# Patient Record
Sex: Female | Born: 1960
Health system: Southern US, Community
[De-identification: ages and names within clinical notes are randomized; demographics above are authoritative.]

## PROBLEM LIST (undated history)

## (undated) DIAGNOSIS — J40 Bronchitis, not specified as acute or chronic: Secondary | ICD-10-CM

## (undated) DIAGNOSIS — K649 Unspecified hemorrhoids: Secondary | ICD-10-CM

## (undated) DIAGNOSIS — R519 Headache, unspecified: Secondary | ICD-10-CM

## (undated) DIAGNOSIS — I10 Essential (primary) hypertension: Secondary | ICD-10-CM

## (undated) DIAGNOSIS — E785 Hyperlipidemia, unspecified: Secondary | ICD-10-CM

## (undated) DIAGNOSIS — D649 Anemia, unspecified: Secondary | ICD-10-CM

## (undated) DIAGNOSIS — N920 Excessive and frequent menstruation with regular cycle: Secondary | ICD-10-CM

## (undated) HISTORY — DX: Excessive and frequent menstruation with regular cycle: N92.0

## (undated) HISTORY — PX: COLONOSCOPY: SHX174

## (undated) HISTORY — DX: Anemia, unspecified: D64.9

## (undated) HISTORY — DX: Hyperlipidemia, unspecified: E78.5

## (undated) HISTORY — DX: Unspecified hemorrhoids: K64.9

## (undated) HISTORY — PX: POLYPECTOMY: SHX149

---

## 1999-12-18 ENCOUNTER — Emergency Department (HOSPITAL_COMMUNITY): Admission: EM | Admit: 1999-12-18 | Discharge: 1999-12-18 | Payer: Self-pay | Admitting: *Deleted

## 2001-01-11 ENCOUNTER — Emergency Department (HOSPITAL_COMMUNITY): Admission: EM | Admit: 2001-01-11 | Discharge: 2001-01-12 | Payer: Self-pay | Admitting: *Deleted

## 2001-06-15 HISTORY — PX: ABDOMINAL HYSTERECTOMY: SHX81

## 2002-03-27 ENCOUNTER — Encounter: Payer: Self-pay | Admitting: Emergency Medicine

## 2002-03-28 ENCOUNTER — Inpatient Hospital Stay (HOSPITAL_COMMUNITY): Admission: EM | Admit: 2002-03-28 | Discharge: 2002-03-30 | Payer: Self-pay | Admitting: Emergency Medicine

## 2002-03-28 ENCOUNTER — Encounter: Payer: Self-pay | Admitting: Emergency Medicine

## 2002-04-13 ENCOUNTER — Other Ambulatory Visit: Admission: RE | Admit: 2002-04-13 | Discharge: 2002-04-13 | Payer: Self-pay | Admitting: Family Medicine

## 2002-04-13 ENCOUNTER — Encounter: Admission: RE | Admit: 2002-04-13 | Discharge: 2002-04-13 | Payer: Self-pay | Admitting: Obstetrics and Gynecology

## 2002-04-14 ENCOUNTER — Encounter (INDEPENDENT_AMBULATORY_CARE_PROVIDER_SITE_OTHER): Payer: Self-pay | Admitting: *Deleted

## 2002-04-21 ENCOUNTER — Ambulatory Visit (HOSPITAL_COMMUNITY): Admission: RE | Admit: 2002-04-21 | Discharge: 2002-04-21 | Payer: Self-pay | Admitting: Obstetrics and Gynecology

## 2002-04-28 ENCOUNTER — Encounter: Admission: RE | Admit: 2002-04-28 | Discharge: 2002-04-28 | Payer: Self-pay | Admitting: *Deleted

## 2002-05-15 ENCOUNTER — Inpatient Hospital Stay (HOSPITAL_COMMUNITY): Admission: RE | Admit: 2002-05-15 | Discharge: 2002-05-19 | Payer: Self-pay | Admitting: Family Medicine

## 2002-05-15 ENCOUNTER — Encounter (INDEPENDENT_AMBULATORY_CARE_PROVIDER_SITE_OTHER): Payer: Self-pay | Admitting: Specialist

## 2002-05-22 ENCOUNTER — Inpatient Hospital Stay (HOSPITAL_COMMUNITY): Admission: AD | Admit: 2002-05-22 | Discharge: 2002-05-22 | Payer: Self-pay | Admitting: *Deleted

## 2002-05-26 ENCOUNTER — Inpatient Hospital Stay (HOSPITAL_COMMUNITY): Admission: AD | Admit: 2002-05-26 | Discharge: 2002-05-26 | Payer: Self-pay | Admitting: *Deleted

## 2002-06-01 ENCOUNTER — Encounter: Admission: RE | Admit: 2002-06-01 | Discharge: 2002-06-01 | Payer: Self-pay | Admitting: Obstetrics and Gynecology

## 2002-11-21 ENCOUNTER — Encounter: Admission: RE | Admit: 2002-11-21 | Discharge: 2002-11-21 | Payer: Self-pay | Admitting: Internal Medicine

## 2002-11-21 ENCOUNTER — Encounter: Payer: Self-pay | Admitting: Internal Medicine

## 2002-11-21 ENCOUNTER — Ambulatory Visit (HOSPITAL_COMMUNITY): Admission: RE | Admit: 2002-11-21 | Discharge: 2002-11-21 | Payer: Self-pay | Admitting: Internal Medicine

## 2003-01-31 ENCOUNTER — Encounter: Admission: RE | Admit: 2003-01-31 | Discharge: 2003-01-31 | Payer: Self-pay | Admitting: Internal Medicine

## 2004-12-12 ENCOUNTER — Emergency Department (HOSPITAL_COMMUNITY): Admission: EM | Admit: 2004-12-12 | Discharge: 2004-12-12 | Payer: Self-pay | Admitting: Emergency Medicine

## 2007-07-10 ENCOUNTER — Emergency Department (HOSPITAL_COMMUNITY): Admission: EM | Admit: 2007-07-10 | Discharge: 2007-07-10 | Payer: Self-pay | Admitting: Emergency Medicine

## 2007-09-23 ENCOUNTER — Emergency Department (HOSPITAL_COMMUNITY): Admission: EM | Admit: 2007-09-23 | Discharge: 2007-09-23 | Payer: Self-pay | Admitting: Emergency Medicine

## 2007-09-25 ENCOUNTER — Emergency Department (HOSPITAL_COMMUNITY): Admission: EM | Admit: 2007-09-25 | Discharge: 2007-09-25 | Payer: Self-pay | Admitting: Emergency Medicine

## 2008-07-26 ENCOUNTER — Emergency Department (HOSPITAL_COMMUNITY): Admission: EM | Admit: 2008-07-26 | Discharge: 2008-07-26 | Payer: Self-pay | Admitting: Emergency Medicine

## 2010-09-30 LAB — POCT I-STAT, CHEM 8
Chloride: 108 mEq/L (ref 96–112)
Glucose, Bld: 113 mg/dL — ABNORMAL HIGH (ref 70–99)
HCT: 39 % (ref 36.0–46.0)
Hemoglobin: 13.3 g/dL (ref 12.0–15.0)
Potassium: 3.8 mEq/L (ref 3.5–5.1)

## 2010-09-30 LAB — SAMPLE TO BLOOD BANK

## 2010-09-30 LAB — CBC
Platelets: 356 10*3/uL (ref 150–400)
RDW: 13.5 % (ref 11.5–15.5)
WBC: 8.5 10*3/uL (ref 4.0–10.5)

## 2010-09-30 LAB — DIFFERENTIAL
Basophils Absolute: 0 10*3/uL (ref 0.0–0.1)
Lymphocytes Relative: 27 % (ref 12–46)
Lymphs Abs: 2.3 10*3/uL (ref 0.7–4.0)
Neutro Abs: 5.4 10*3/uL (ref 1.7–7.7)
Neutrophils Relative %: 64 % (ref 43–77)
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
## 2010-10-31 NOTE — H&P (Signed)
   NAME:  Allison Mcintyre, Allison Mcintyre                     ACCOUNT NO.:  [id number]   MEDICAL RECORD NO.:  [id number]                   PATIENT TYPE:  INP   LOCATION:  NA                                   FACILITY:  [facility]   PHYSICIAN:  Tanya S. Shawnie Pons, M.D.                DATE OF BIRTH:  January 26, 1961   DATE OF ADMISSION:  05/15/2002  DATE OF DISCHARGE:                                HISTORY & PHYSICAL   CHIEF COMPLAINT:  Heavy menses.   HISTORY OF PRESENT ILLNESS:  The patient is a 50 year old, G4, P1 referred  for large fibroid uterus who was previously admitted to [facility]  with hemoglobin 5.5.  She is status post transfusion of five units of packed  red blood cells.  She does report heavy menses for the past seven to eight  months and increasing abdominal girth.   PAST MEDICAL HISTORY:  1. Anemia.  2. One prior C-section.   MEDICATIONS:  None.   ALLERGIES:  No known drug allergies.   SOCIAL HISTORY:  She is a smoker of a 1/2 pack per day.  She drinks alcohol  three times per week.   FAMILY HISTORY:  Significant for breast cancer, hypertension and diabetes  mellitus.   PHYSICAL EXAMINATION:  VITAL SIGNS:  Weight 209.8, blood pressure 135/71,  pulse 74.  GENERAL:  She is a well-developed, well-nourished, African-American female  in no acute distress.  HEENT:  Pupils equal round and reactive to light and accommodation.  Extraocular movements intact.  Sclerae anicteric  NECK:  Supple without lymphadenopathy or thyromegaly.  LUNGS:  Clear bilaterally.  HEART:  Regular S1, S2 with no murmurs.  BREASTS:  Benign and symmetric with no masses with everted nipples.  ABDOMEN:  Large mass consistent with fibroids.  GENITALIA:  Normal external female genitalia.  The vagina is rugae with  thin, watery discharge.  Her cervix is small without lesion.  Her uterus is  24-28 weeks size with multiple fibroids.  Her adnexa are unable to be  assessed at this time.   LABORATORY DATA AND  X-RAY FINDINGS:  The patient has a normal Pap smear and  a normal endometrial biopsy.   IMPRESSION:  1. Fibroid uterus.  2.     Anemia.  3. Menometrorrhagia.   PLAN:  The plan is for a TAH.                                               Shelbie Proctor. Shawnie Pons, M.D.    TSP/MEDQ  D:  05/11/2002  T:  05/12/2002  Job:  [id number]

## 2010-10-31 NOTE — Discharge Summary (Signed)
   NAME:  Allison Mcintyre, Allison Mcintyre                     ACCOUNT NO.:  [id number]   MEDICAL RECORD NO.:  [id number]                   PATIENT TYPE:  INP   LOCATION:  9324                                 FACILITY:  [facility]   PHYSICIAN:  Tanya S. Shawnie Pons, M.D.                DATE OF BIRTH:  Sep 25, 1960   DATE OF ADMISSION:  05/15/2002  DATE OF DISCHARGE:  05/19/2002                                 DISCHARGE SUMMARY   FINAL DIAGNOSES:  1. Fibroid uterus.  2. Menometrorrhagia.  3. Significant anemia.   PROCEDURE:  1. Total abdominal hysterectomy.  2. Right salpingo-oophorectomy.   PERTINENT LABORATORY VALUES:  A preoperative hemoglobin 11.8, postoperative  hemoglobin 9.8.  Urine pregnancy test that was negative.   REASON FOR ADMISSION:  Briefly, patient is a 50 year old gravida 4, para 1  who was admitted after being found to have a large fibroid uterus who  desired definitive treatment.   HOSPITAL COURSE:  The patient was admitted on the day of surgery and  underwent a TAH/RSO without complication.  Postoperatively she was  transferred to the floor where she had routine postoperative care.  She did  develop a mild ileus on postoperative day number two which was resolved with  a Dulcolax suppository.  Her pain control was slightly limited after her PCA  was discontinued so she was kept an additional day and discharged home on  postoperative day number four.  At that time she had been afebrile since her  surgery and she had good urine output and stable vital signs.  She was  tolerating a regular diet without nausea and vomiting.   DISCHARGE DISPOSITION AND CONDITION:  The patient is discharged home in good  condition.   DISCHARGE INSTRUCTIONS:  No heavy lifting for the next six weeks.  No  driving for the next two weeks.   FOLLOW UP:  GYN clinic in two weeks.  She will return to [facility] at [facility] for staple removal on Monday, December 8.   DISCHARGE MEDICATIONS:  1. Percocet 5/325  one to two p.o. q.4-6h. p.r.n. pain number 48.  2. Ibuprofen 600 mg one p.o. q.6h. p.r.n. with food number 45.                                               Shelbie Proctor. Shawnie Pons, M.D.    TSP/MEDQ  D:  05/19/2002  T:  05/20/2002  Job:  [id number]

## 2010-10-31 NOTE — Op Note (Signed)
NAME:  Allison Mcintyre, Allison Mcintyre                     ACCOUNT NO.:  [id number]   MEDICAL RECORD NO.:  [id number]                   PATIENT TYPE:  INP   LOCATION:  9324                                 FACILITY:  [facility]   PHYSICIAN:  Tanya S. Shawnie Pons, M.D.                DATE OF BIRTH:  1960/11/15   DATE OF PROCEDURE:  05/15/2002  DATE OF DISCHARGE:                                 OPERATIVE REPORT   PREOPERATIVE DIAGNOSES:  1. Fibroid uterus.  2. Anemia.   POSTOPERATIVE DIAGNOSES:  1. Fibroid uterus.  2. Anemia.   PROCEDURE:  Total abdominal hysterectomy and right salpingo-oophorectomy.   SURGEON:  Shelbie Proctor. Shawnie Pons, M.D.   ASSISTANT:  Mary Sella. Orlene Erm, M.D.   ANESTHESIA:  General.   ESTIMATED BLOOD LOSS:  400 cc.   COMPLICATIONS:  None.   SPECIMENS:  Uterus, cervix, right tube and ovary to pathology.   INDICATIONS:  The patient is a 50 year old G4, P1, with a history of  menometrorrhagia with a hemoglobin of 5.5.  She is status post transfusion  of 5 units of packed cells.  She was found to have a large fibroid uterus.  She was refractory to therapy.  The patient did have a normal endometrial  biopsy and Pap smear prior to this procedure.  The patient does desire  definitive treatment and given the size of her uterus it was felt that  surgery would be her only option.  She had no future fertility needs.   DESCRIPTION OF PROCEDURE:  The patient was taken to the operating room where  she was placed in the supine position with TED hose and sequential  compression device was placed.  She was given 2 g of Ancef prior to  incision.  She was prepped and draped in the usual sterile fashion.  Anesthesia was administered.  When she was completely anesthetized a  vertical skin incision was made through previous C-section scar from the  pubic symphysis to the umbilicus.  This was carried down to the underlying  fascia which was then sharply divided.  The fascia edge was then dissected  off the  rectus muscles and the peritoneal cavity entered.  The cautery was  used to dissect superiorly and inferiorly to open the incision.  Upon entry  into the abdomen, the cavity was explored.  The liver edge felt normal as  did the omentum and the bowels.  The kidneys also felt normal.  Her uterus  was very large but was able to be delivered through the incision.  However,  there was no room to place the retractor blades.  After an attempt was made  to place retraction blades with the Bookwalter the incision was then  extended superiorly around the umbilicus at which time the Bookwalter was  then used for retraction and wet laps were used to pack the bowel away.  We  tried delivering the uterus through the incision.  A  rent was made in the  right IP which had to be clamped and the right tube and ovary were  subsequently taken with the specimen.  The right round was then suture  ligated and divided and the anterior ligament of the broad dissected,  particularly the bladder flap.  The posterior leaf of the broad was then  dissected down in attempt to find the ureter but this could not be located  at this time.  The IP was then doubly clamped and ligated with a free tie  followed by a suture.  Subsequently, the left round ligament was grasped and  ligated and the anterior leaf of the broad was then taken to create a  bladder flap which was pushed down fairly evenly.  The posterior leaf was  then divided down, and the utero-ovarian pedicle was doubly clamped and  ligated with a free tie followed by a suture.  The uterine arteries were  then clamped on each side and suture ligated with #1 Vicryl suture.  Straight clamps were then used to advance along the remaining uterus down to  the cervix taking bites which were sequentially suture ligated.  The uterus  was then transected at the level of the cervix just for better visualization  of the surgical field.  The cervix was then grasped with a Kocher  clamp and  straight clamps were then sequentially used to suture ligate the pedicles  down to the uterosacral ligaments.  Then two clamps were clamped across the  vagina and the cervix removed intact.  A single running suture of #1 Vicryl  was then placed from each clamp from the midline to the uterosacral  ligament.  The clamp was then removed, and the suture was run back to the  midline and tied down so that the vagina was never entered.  At the end of  this, all pedicles appeared to be dry.  Attempt was made to go back to the  right posterior leaf of the broad ligament to find the ureter.  This was  found and had a good peristalsis.  It was followed along for some distance  and thought to be intact.  The incision in the abdomen was then irrigated  with warm water.  The pedicles were all checked and found to be hemostatic.  All instruments and sponges were removed from the abdominal cavity including  the retractor blades and the fascia was closed with 0 PDS looped in a  modified running Smead-Jones fashion superiorly and then inferior edge of  the fascia was closed with 0 Vicryl looped PDS in a running fashion and  these two sutures were tied in the midline.  The subcu tissue was then  copiously irrigated and found to be hemostatic.  The skin was closed with  clips.  All instrument, needle and lap counts were correct x2.  The patient  was awakened, extubated and taken to the recovery room in stable condition.                                               Shelbie Proctor. Shawnie Pons, M.D.    TSP/MEDQ  D:  05/15/2002  T:  05/16/2002  Job:  [id number]

## 2010-10-31 NOTE — Discharge Summary (Signed)
NAME:  Allison Mcintyre, Allison Mcintyre                     ACCOUNT NO.:  [id number]   MEDICAL RECORD NO.:  [id number]                   PATIENT TYPE:  INP   LOCATION:  0365                                 [facility]ACILITY:  [facility]   PHYSICIAN:  Armstead Peaks, M.D.                   DATE OF BIRTH:  1960/12/12   DATE OF ADMISSION:  03/28/2002  DATE OF DISCHARGE:  03/30/2002                                 DISCHARGE SUMMARY   DISCHARGE DIAGNOSES:  1. Severe iron-deficiency anemia secondary to heavy menstrual periods.  2. Uterine fibroids.   PROCEDURE:  1. Packed red blood cells transfusion.  2. Computed tomography of the chest, abdomen, and pelvis.   DISCHARGE MEDICATIONS:  Nu Iron 150 mg one pill b.i.d.   HISTORY OF PRESENT ILLNESS:  The patient is a 50 year old female who  presented to the [facility] Emergency room with complaints of  several month history of shortness of breath.  In the emergency department,  the patient was sent for a CT scan of the chest, abdomen, and pelvis to rule  out pulmonary embolism which was negative for pulmonary embolism or deep  vein thrombosis.  She was noted, however, to have a very large uterus with  fibroids.  She was found to have a severely low hemoglobin at 5.5 with a low  MCV.  She was admitted for further evaluation and care, and the following  developments took place:   HOSPITAL COURSE:  #1 -  ANEMIA:  The patient received 5 units of packed red  blood cells transfusions which raised her hemoglobin from 5.5 to  approximately 9.5 to 10.0.  This remained stable for 24 to 36 hours prior to  discharge.  She was started on iron supplementation.  This was thought to be  secondary to heavy menstrual periods, and it is likely that the patient will  need a hysterectomy in the future to help prevent recurrent symptoms with  anemia.  She is agreeable to this, and will see the outpatient [facility] gynecology  clinic in the next one to two weeks for further  consideration.  #2 -  INFECTIOUS DISEASE:  The patient did have a negative gonorrhea,  chlamydia, and RPR during her hospital stay.   DISPOSITION:  The patient is discharged in stable condition.  She will call  [facility] and establish appointment for regular medical care.  She will  also call the outpatient OB gynecology clinic to establish an appointment in  the next one to two weeks for consideration of possible hysterectomy.  She  will need a followup CBC in approximately two weeks for re-evaluation.   CONDITION ON DISCHARGE:  Stable.    ACTIVITY:  No restrictions.   DIET:  Regular.  Armstead Peaks, M.D.    BJ/MEDQ  D:  03/31/2002  T:  04/01/2002  Job:  [id number]

## 2013-02-28 ENCOUNTER — Other Ambulatory Visit: Payer: Self-pay | Admitting: Obstetrics and Gynecology

## 2013-02-28 DIAGNOSIS — Z1231 Encounter for screening mammogram for malignant neoplasm of breast: Secondary | ICD-10-CM

## 2013-03-02 ENCOUNTER — Encounter: Payer: Self-pay | Admitting: Obstetrics and Gynecology

## 2013-03-02 ENCOUNTER — Ambulatory Visit (INDEPENDENT_AMBULATORY_CARE_PROVIDER_SITE_OTHER): Payer: PRIVATE HEALTH INSURANCE | Admitting: Obstetrics and Gynecology

## 2013-03-02 ENCOUNTER — Ambulatory Visit (HOSPITAL_COMMUNITY)
Admission: RE | Admit: 2013-03-02 | Discharge: 2013-03-02 | Disposition: A | Discharge status: Home / Self Care | Payer: PRIVATE HEALTH INSURANCE | Source: Ambulatory Visit | Attending: Obstetrics and Gynecology | Admitting: Obstetrics and Gynecology

## 2013-03-02 VITALS — BP 135/72 | HR 65 | Temp 98.9°F | Ht 65.5 in | Wt 255.7 lb

## 2013-03-02 DIAGNOSIS — Z01419 Encounter for gynecological examination (general) (routine) without abnormal findings: Secondary | ICD-10-CM

## 2013-03-02 DIAGNOSIS — Z1231 Encounter for screening mammogram for malignant neoplasm of breast: Secondary | ICD-10-CM | POA: Insufficient documentation

## 2013-03-02 NOTE — Patient Instructions (Signed)
Preventive Care for Adults, Female A healthy lifestyle and preventive care can promote health and wellness. Preventive health guidelines for women include the following key practices.  A routine yearly physical is a good way to check with your caregiver about your health and preventive screening. It is a chance to share any concerns and updates on your health, and to receive a thorough exam.  Visit your dentist for a routine exam and preventive care every 6 months. Brush your teeth twice a day and floss once a day. Good oral hygiene prevents tooth decay and gum disease.  The frequency of eye exams is based on your age, health, family medical history, use of contact lenses, and other factors. Follow your caregiver's recommendations for frequency of eye exams.  Eat a healthy diet. Foods like vegetables, fruits, whole grains, low-fat dairy products, and lean protein foods contain the nutrients you need without too many calories. Decrease your intake of foods high in solid fats, added sugars, and salt. Eat the right amount of calories for you.Get information about a proper diet from your caregiver, if necessary.  Regular physical exercise is one of the most important things you can do for your health. Most adults should get at least 150 minutes of moderate-intensity exercise (any activity that increases your heart rate and causes you to sweat) each week. In addition, most adults need muscle-strengthening exercises on 2 or more days a week.  Maintain a healthy weight. The body mass index (BMI) is a screening tool to identify possible weight problems. It provides an estimate of body fat based on height and weight. Your caregiver can help determine your BMI, and can help you achieve or maintain a healthy weight.For adults 20 years and older:  A BMI below 18.5 is considered underweight.  A BMI of 18.5 to 24.9 is normal.  A BMI of 25 to 29.9 is considered overweight.  A BMI of 30 and above is  considered obese.  Maintain normal blood lipids and cholesterol levels by exercising and minimizing your intake of saturated fat. Eat a balanced diet with plenty of fruit and vegetables. Blood tests for lipids and cholesterol should begin at age 20 and be repeated every 5 years. If your lipid or cholesterol levels are high, you are over 50, or you are at high risk for heart disease, you may need your cholesterol levels checked more frequently.Ongoing high lipid and cholesterol levels should be treated with medicines if diet and exercise are not effective.  If you smoke, find out from your caregiver how to quit. If you do not use tobacco, do not start.  If you are pregnant, do not drink alcohol. If you are breastfeeding, be very cautious about drinking alcohol. If you are not pregnant and choose to drink alcohol, do not exceed 1 drink per day. One drink is considered to be 12 ounces (355 mL) of beer, 5 ounces (148 mL) of wine, or 1.5 ounces (44 mL) of liquor.  Avoid use of street drugs. Do not share needles with anyone. Ask for help if you need support or instructions about stopping the use of drugs.  High blood pressure causes heart disease and increases the risk of stroke. Your blood pressure should be checked at least every 1 to 2 years. Ongoing high blood pressure should be treated with medicines if weight loss and exercise are not effective.  If you are 55 to 54 years old, ask your caregiver if you should take aspirin to prevent strokes.  Diabetes   screening involves taking a blood sample to check your fasting blood sugar level. This should be done once every 3 years, after age 45, if you are within normal weight and without risk factors for diabetes. Testing should be considered at a younger age or be carried out more frequently if you are overweight and have at least 1 risk factor for diabetes.  Breast cancer screening is essential preventive care for women. You should practice "breast  self-awareness." This means understanding the normal appearance and feel of your breasts and may include breast self-examination. Any changes detected, no matter how small, should be reported to a caregiver. Women in their 20s and 30s should have a clinical breast exam (CBE) by a caregiver as part of a regular health exam every 1 to 3 years. After age 40, women should have a CBE every year. Starting at age 40, women should consider having a mammography (breast X-ray test) every year. Women who have a family history of breast cancer should talk to their caregiver about genetic screening. Women at a high risk of breast cancer should talk to their caregivers about having magnetic resonance imaging (MRI) and a mammography every year.  The Pap test is a screening test for cervical cancer. A Pap test can show cell changes on the cervix that might become cervical cancer if left untreated. A Pap test is a procedure in which cells are obtained and examined from the lower end of the uterus (cervix).  Women should have a Pap test starting at age 21.  Between ages 21 and 29, Pap tests should be repeated every 2 years.  Beginning at age 30, you should have a Pap test every 3 years as long as the past 3 Pap tests have been normal.  Some women have medical problems that increase the chance of getting cervical cancer. Talk to your caregiver about these problems. It is especially important to talk to your caregiver if a new problem develops soon after your last Pap test. In these cases, your caregiver may recommend more frequent screening and Pap tests.  The above recommendations are the same for women who have or have not gotten the vaccine for human papillomavirus (HPV).  If you had a hysterectomy for a problem that was not cancer or a condition that could lead to cancer, then you no longer need Pap tests. Even if you no longer need a Pap test, a regular exam is a good idea to make sure no other problems are  starting.  If you are between ages 65 and 70, and you have had normal Pap tests going back 10 years, you no longer need Pap tests. Even if you no longer need a Pap test, a regular exam is a good idea to make sure no other problems are starting.  If you have had past treatment for cervical cancer or a condition that could lead to cancer, you need Pap tests and screening for cancer for at least 20 years after your treatment.  If Pap tests have been discontinued, risk factors (such as a new sexual partner) need to be reassessed to determine if screening should be resumed.  The HPV test is an additional test that may be used for cervical cancer screening. The HPV test looks for the virus that can cause the cell changes on the cervix. The cells collected during the Pap test can be tested for HPV. The HPV test could be used to screen women aged 30 years and older, and should   be used in women of any age who have unclear Pap test results. After the age of 30, women should have HPV testing at the same frequency as a Pap test.  Colorectal cancer can be detected and often prevented. Most routine colorectal cancer screening begins at the age of 50 and continues through age 75. However, your caregiver may recommend screening at an earlier age if you have risk factors for colon cancer. On a yearly basis, your caregiver may provide home test kits to check for hidden blood in the stool. Use of a small camera at the end of a tube, to directly examine the colon (sigmoidoscopy or colonoscopy), can detect the earliest forms of colorectal cancer. Talk to your caregiver about this at age 50, when routine screening begins. Direct examination of the colon should be repeated every 5 to 10 years through age 75, unless early forms of pre-cancerous polyps or small growths are found.  Hepatitis C blood testing is recommended for all people born from 1945 through 1965 and any individual with known risks for hepatitis C.  Practice  safe sex. Use condoms and avoid high-risk sexual practices to reduce the spread of sexually transmitted infections (STIs). STIs include gonorrhea, chlamydia, syphilis, trichomonas, herpes, HPV, and human immunodeficiency virus (HIV). Herpes, HIV, and HPV are viral illnesses that have no cure. They can result in disability, cancer, and death. Sexually active women aged 25 and younger should be checked for chlamydia. Older women with new or multiple partners should also be tested for chlamydia. Testing for other STIs is recommended if you are sexually active and at increased risk.  Osteoporosis is a disease in which the bones lose minerals and strength with aging. This can result in serious bone fractures. The risk of osteoporosis can be identified using a bone density scan. Women ages 65 and over and women at risk for fractures or osteoporosis should discuss screening with their caregivers. Ask your caregiver whether you should take a calcium supplement or vitamin D to reduce the rate of osteoporosis.  Menopause can be associated with physical symptoms and risks. Hormone replacement therapy is available to decrease symptoms and risks. You should talk to your caregiver about whether hormone replacement therapy is right for you.  Use sunscreen with sun protection factor (SPF) of 30 or more. Apply sunscreen liberally and repeatedly throughout the day. You should seek shade when your shadow is shorter than you. Protect yourself by wearing long sleeves, pants, a wide-brimmed hat, and sunglasses year round, whenever you are outdoors.  Once a month, do a whole body skin exam, using a mirror to look at the skin on your back. Notify your caregiver of new moles, moles that have irregular borders, moles that are larger than a pencil eraser, or moles that have changed in shape or color.  Stay current with required immunizations.  Influenza. You need a dose every fall (or winter). The composition of the flu vaccine  changes each year, so being vaccinated once is not enough.  Pneumococcal polysaccharide. You need 1 to 2 doses if you smoke cigarettes or if you have certain chronic medical conditions. You need 1 dose at age 65 (or older) if you have never been vaccinated.  Tetanus, diphtheria, pertussis (Tdap, Td). Get 1 dose of Tdap vaccine if you are younger than age 65, are over 65 and have contact with an infant, are a healthcare worker, are pregnant, or simply want to be protected from whooping cough. After that, you need a Td   booster dose every 10 years. Consult your caregiver if you have not had at least 3 tetanus and diphtheria-containing shots sometime in your life or have a deep or dirty wound.  HPV. You need this vaccine if you are a woman age 26 or younger. The vaccine is given in 3 doses over 6 months.  Measles, mumps, rubella (MMR). You need at least 1 dose of MMR if you were born in 1957 or later. You may also need a second dose.  Meningococcal. If you are age 19 to 21 and a first-year college student living in a residence hall, or have one of several medical conditions, you need to get vaccinated against meningococcal disease. You may also need additional booster doses.  Zoster (shingles). If you are age 60 or older, you should get this vaccine.  Varicella (chickenpox). If you have never had chickenpox or you were vaccinated but received only 1 dose, talk to your caregiver to find out if you need this vaccine.  Hepatitis A. You need this vaccine if you have a specific risk factor for hepatitis A virus infection or you simply wish to be protected from this disease. The vaccine is usually given as 2 doses, 6 to 18 months apart.  Hepatitis B. You need this vaccine if you have a specific risk factor for hepatitis B virus infection or you simply wish to be protected from this disease. The vaccine is given in 3 doses, usually over 6 months. Preventive Services / Frequency Ages 19 to 39  Blood  pressure check.** / Every 1 to 2 years.  Lipid and cholesterol check.** / Every 5 years beginning at age 20.  Clinical breast exam.** / Every 3 years for women in their 20s and 30s.  Pap test.** / Every 2 years from ages 21 through 29. Every 3 years starting at age 30 through age 65 or 70 with a history of 3 consecutive normal Pap tests.  HPV screening.** / Every 3 years from ages 30 through ages 65 to 70 with a history of 3 consecutive normal Pap tests.  Hepatitis C blood test.** / For any individual with known risks for hepatitis C.  Skin self-exam. / Monthly.  Influenza immunization.** / Every year.  Pneumococcal polysaccharide immunization.** / 1 to 2 doses if you smoke cigarettes or if you have certain chronic medical conditions.  Tetanus, diphtheria, pertussis (Tdap, Td) immunization. / A one-time dose of Tdap vaccine. After that, you need a Td booster dose every 10 years.  HPV immunization. / 3 doses over 6 months, if you are 26 and younger.  Measles, mumps, rubella (MMR) immunization. / You need at least 1 dose of MMR if you were born in 1957 or later. You may also need a second dose.  Meningococcal immunization. / 1 dose if you are age 19 to 21 and a first-year college student living in a residence hall, or have one of several medical conditions, you need to get vaccinated against meningococcal disease. You may also need additional booster doses.  Varicella immunization.** / Consult your caregiver.  Hepatitis A immunization.** / Consult your caregiver. 2 doses, 6 to 18 months apart.  Hepatitis B immunization.** / Consult your caregiver. 3 doses usually over 6 months. Ages 40 to 64  Blood pressure check.** / Every 1 to 2 years.  Lipid and cholesterol check.** / Every 5 years beginning at age 20.  Clinical breast exam.** / Every year after age 40.  Mammogram.** / Every year beginning at age 40   and continuing for as long as you are in good health. Consult with your  caregiver.  Pap test.** / Every 3 years starting at age 30 through age 65 or 70 with a history of 3 consecutive normal Pap tests.  HPV screening.** / Every 3 years from ages 30 through ages 65 to 70 with a history of 3 consecutive normal Pap tests.  Fecal occult blood test (FOBT) of stool. / Every year beginning at age 50 and continuing until age 75. You may not need to do this test if you get a colonoscopy every 10 years.  Flexible sigmoidoscopy or colonoscopy.** / Every 5 years for a flexible sigmoidoscopy or every 10 years for a colonoscopy beginning at age 50 and continuing until age 75.  Hepatitis C blood test.** / For all people born from 1945 through 1965 and any individual with known risks for hepatitis C.  Skin self-exam. / Monthly.  Influenza immunization.** / Every year.  Pneumococcal polysaccharide immunization.** / 1 to 2 doses if you smoke cigarettes or if you have certain chronic medical conditions.  Tetanus, diphtheria, pertussis (Tdap, Td) immunization.** / A one-time dose of Tdap vaccine. After that, you need a Td booster dose every 10 years.  Measles, mumps, rubella (MMR) immunization. / You need at least 1 dose of MMR if you were born in 1957 or later. You may also need a second dose.  Varicella immunization.** / Consult your caregiver.  Meningococcal immunization.** / Consult your caregiver.  Hepatitis A immunization.** / Consult your caregiver. 2 doses, 6 to 18 months apart.  Hepatitis B immunization.** / Consult your caregiver. 3 doses, usually over 6 months. Ages 65 and over  Blood pressure check.** / Every 1 to 2 years.  Lipid and cholesterol check.** / Every 5 years beginning at age 20.  Clinical breast exam.** / Every year after age 40.  Mammogram.** / Every year beginning at age 40 and continuing for as long as you are in good health. Consult with your caregiver.  Pap test.** / Every 3 years starting at age 30 through age 65 or 70 with a 3  consecutive normal Pap tests. Testing can be stopped between 65 and 70 with 3 consecutive normal Pap tests and no abnormal Pap or HPV tests in the past 10 years.  HPV screening.** / Every 3 years from ages 30 through ages 65 or 70 with a history of 3 consecutive normal Pap tests. Testing can be stopped between 65 and 70 with 3 consecutive normal Pap tests and no abnormal Pap or HPV tests in the past 10 years.  Fecal occult blood test (FOBT) of stool. / Every year beginning at age 50 and continuing until age 75. You may not need to do this test if you get a colonoscopy every 10 years.  Flexible sigmoidoscopy or colonoscopy.** / Every 5 years for a flexible sigmoidoscopy or every 10 years for a colonoscopy beginning at age 50 and continuing until age 75.  Hepatitis C blood test.** / For all people born from 1945 through 1965 and any individual with known risks for hepatitis C.  Osteoporosis screening.** / A one-time screening for women ages 65 and over and women at risk for fractures or osteoporosis.  Skin self-exam. / Monthly.  Influenza immunization.** / Every year.  Pneumococcal polysaccharide immunization.** / 1 dose at age 65 (or older) if you have never been vaccinated.  Tetanus, diphtheria, pertussis (Tdap, Td) immunization. / A one-time dose of Tdap vaccine if you are over   65 and have contact with an infant, are a healthcare worker, or simply want to be protected from whooping cough. After that, you need a Td booster dose every 10 years.  Varicella immunization.** / Consult your caregiver.  Meningococcal immunization.** / Consult your caregiver.  Hepatitis A immunization.** / Consult your caregiver. 2 doses, 6 to 18 months apart.  Hepatitis B immunization.** / Check with your caregiver. 3 doses, usually over 6 months. ** Family history and personal history of risk and conditions may change your caregiver's recommendations. Document Released: 07/28/2001 Document Revised: 08/24/2011  Document Reviewed: 10/27/2010 [employer] Patient Information 2014 [employer].  

## 2013-03-02 NOTE — Progress Notes (Signed)
  Subjective:     Allison Mcintyre is a 52 y.o. female [phone] s/p surgical menopause who is here for a comprehensive physical exam. The patient reports no problems. Patient has had a hysterectomy in 2003 secondary to menorrhagia.  History   Social History  . Marital Status: Single    Spouse Name: N/A    Number of Children: N/A  . Years of Education: N/A   Occupational History  . Not on file.   Social History Main Topics  . Smoking status: Current Every Day Smoker -- 0.25 packs/day    Types: Cigarettes  . Smokeless tobacco: Never Used  . Alcohol Use: 1.8 oz/week    3 Cans of beer per week     Comment: occasional , about weekly  . Drug Use: No  . Sexual Activity: Yes    Birth Control/ Protection: Surgical   Other Topics Concern  . Not on file   Social History Narrative  . No narrative on file   Health Maintenance  Topic Date Due  . Pap Smear  04/16/1977  . Tetanus/tdap  04/16/1978  . Mammogram  04/16/2009  . Colonoscopy  04/16/2009  . Influenza Vaccine  01/13/2013   Past Medical History  Diagnosis Date  . Menorrhagia    Past Surgical History  Procedure Laterality Date  . Abdominal hysterectomy  2003  . Cesarean section     Family History  Problem Relation Age of Onset  . Cancer Mother   . Bronchitis Father    History  Substance Use Topics  . Smoking status: Current Every Day Smoker -- 0.25 packs/day    Types: Cigarettes  . Smokeless tobacco: Never Used  . Alcohol Use: 1.8 oz/week    3 Cans of beer per week     Comment: occasional , about weekly       Review of Systems A comprehensive review of systems was negative.   Objective:      GENERAL: Well-developed, well-nourished female in no acute distress.  HEENT: Normocephalic, atraumatic. Sclerae anicteric.  NECK: Supple. Normal thyroid.  LUNGS: Clear to auscultation bilaterally.  HEART: Regular rate and rhythm. BREASTS: Symmetric in size. No palpable masses or lymphadenopathy, skin changes,  or nipple drainage. ABDOMEN: Soft, nontender, nondistended. No organomegaly. PELVIC: Normal external female genitalia. Vagina is pink and rugated.  Normal discharge. No adnexal mass or tenderness. EXTREMITIES: No cyanosis, clubbing, or edema, 2+ distal pulses.    Assessment:    Healthy female exam.      Plan:     Patient scheduled for mammogram this afternoon Patient without h/o abnormal pap smear- no need for further pap Patient advised to continue monthly self breast and vulva exams Patient will be contacted with any abnormal results PCP contact list provided to patient to establish care RTC in 1 year or prn See After Visit Summary for Counseling Recommendations

## 2013-03-02 NOTE — Progress Notes (Signed)
States here to get breast exam and pap because it has been " a while"

## 2013-04-21 ENCOUNTER — Ambulatory Visit (INDEPENDENT_AMBULATORY_CARE_PROVIDER_SITE_OTHER): Payer: PRIVATE HEALTH INSURANCE | Admitting: Family Medicine

## 2013-04-21 ENCOUNTER — Encounter: Payer: Self-pay | Admitting: Family Medicine

## 2013-04-21 VITALS — BP 148/82 | HR 75 | Ht 66.0 in | Wt 254.0 lb

## 2013-04-21 DIAGNOSIS — M7660 Achilles tendinitis, unspecified leg: Secondary | ICD-10-CM | POA: Insufficient documentation

## 2013-04-21 DIAGNOSIS — M7661 Achilles tendinitis, right leg: Secondary | ICD-10-CM

## 2013-04-21 MED ORDER — IBUPROFEN 600 MG PO TABS: 600.0000 mg | ORAL_TABLET | Freq: Three times a day (TID) | ORAL | Status: DC | PRN

## 2013-04-21 NOTE — Assessment & Plan Note (Signed)
Acute flair over the past few weeks, pain for months - ice, ibuprofen 600mg  - gave achilles rehab exercises and reviewed with patient - instructed to elevate as able - f/u in 3 months or sooner if still having pain

## 2013-04-21 NOTE — Patient Instructions (Signed)
I think you have injured your achilles tendon. The treatments for this are antiinflammatory medications, ice and the exercises we discussed. The more you do these exercises the faster you will feel better.  Thank you.  Dr. Richarda Blade

## 2013-04-21 NOTE — Progress Notes (Signed)
  Subjective:    Patient ID: Allison Mcintyre, female    DOB: [birth year], 52 y.o.   MRN: [id number]  HPI 52yo female presents with pain in her heel for months that has worsened over the past few weeks. The pain is mostly over her achilles tendon but also on the plantar surface of her heel as well. Worse in the morning and when walking on it a lot at work. Has been taking ibuprofen 200mg  which doesn't help much. It is an achy pain that does not radiate.   Review of Systems  Musculoskeletal: Positive for arthralgias. Negative for joint swelling.  Neurological: Negative for weakness and numbness.  All other systems reviewed and are negative.       Objective:   Physical Exam  Nursing note and vitals reviewed. Constitutional: She is oriented to person, place, and time. She appears well-developed and well-nourished. No distress.  HENT:  Head: Normocephalic and atraumatic.  Right Ear: External ear normal.  Left Ear: External ear normal.  Nose: Nose normal.  Eyes: Conjunctivae are normal. Right eye exhibits no discharge. Left eye exhibits no discharge. No scleral icterus.  Pulmonary/Chest: Effort normal.  Musculoskeletal: Normal range of motion. She exhibits tenderness. She exhibits no edema.       Right foot: She exhibits tenderness. She exhibits normal range of motion, no bony tenderness, no swelling, normal capillary refill, no crepitus, no deformity and no laceration.       Left foot: Normal.       Feet:  Pain worsened by standing on tip toes  Neurological: She is alert and oriented to person, place, and time.  Skin: Skin is warm and dry. She is not diaphoretic. No erythema.  Psychiatric: She has a normal mood and affect. Her behavior is normal.          Assessment & Plan:

## 2013-08-08 ENCOUNTER — Telehealth: Payer: Self-pay | Admitting: Family Medicine

## 2013-08-08 ENCOUNTER — Other Ambulatory Visit: Payer: Self-pay | Admitting: Family Medicine

## 2013-08-08 DIAGNOSIS — M7661 Achilles tendinitis, right leg: Secondary | ICD-10-CM

## 2013-08-08 MED ORDER — IBUPROFEN 600 MG PO TABS: 600.0000 mg | ORAL_TABLET | Freq: Three times a day (TID) | ORAL | Status: DC | PRN

## 2013-08-08 NOTE — Telephone Encounter (Signed)
Refill request for Ibuprofen.

## 2013-08-08 NOTE — Telephone Encounter (Signed)
Refill sent to pharmacy, please inform patient 

## 2013-09-08 ENCOUNTER — Ambulatory Visit: Payer: PRIVATE HEALTH INSURANCE | Admitting: Family Medicine

## 2013-10-17 ENCOUNTER — Telehealth: Payer: Self-pay | Admitting: Family Medicine

## 2013-10-17 ENCOUNTER — Other Ambulatory Visit: Payer: Self-pay | Admitting: Family Medicine

## 2013-10-17 DIAGNOSIS — M7661 Achilles tendinitis, right leg: Secondary | ICD-10-CM

## 2013-10-17 MED ORDER — IBUPROFEN 600 MG PO TABS: 600.0000 mg | ORAL_TABLET | Freq: Three times a day (TID) | ORAL | Status: DC | PRN

## 2013-10-17 NOTE — Telephone Encounter (Signed)
Refill sent to pharmacy, please inform patient.  Thanks!

## 2013-10-17 NOTE — Telephone Encounter (Signed)
Please refill patient's ibuprofen rx.  Patient scheduled an appt on 6/5 @ 1:30

## 2013-11-17 ENCOUNTER — Ambulatory Visit: Payer: PRIVATE HEALTH INSURANCE | Admitting: Family Medicine

## 2013-12-11 ENCOUNTER — Encounter: Payer: Self-pay | Admitting: Internal Medicine

## 2013-12-11 ENCOUNTER — Encounter: Payer: Self-pay | Admitting: Family Medicine

## 2013-12-11 ENCOUNTER — Ambulatory Visit (INDEPENDENT_AMBULATORY_CARE_PROVIDER_SITE_OTHER): Payer: PRIVATE HEALTH INSURANCE | Admitting: Family Medicine

## 2013-12-11 VITALS — BP 134/83 | HR 65 | Temp 98.2°F | Ht 65.5 in | Wt 256.0 lb

## 2013-12-11 DIAGNOSIS — R030 Elevated blood-pressure reading, without diagnosis of hypertension: Secondary | ICD-10-CM | POA: Diagnosis not present

## 2013-12-11 DIAGNOSIS — I10 Essential (primary) hypertension: Secondary | ICD-10-CM | POA: Insufficient documentation

## 2013-12-11 DIAGNOSIS — R229 Localized swelling, mass and lump, unspecified: Secondary | ICD-10-CM

## 2013-12-11 DIAGNOSIS — R2241 Localized swelling, mass and lump, right lower limb: Secondary | ICD-10-CM | POA: Insufficient documentation

## 2013-12-11 DIAGNOSIS — IMO0001 Reserved for inherently not codable concepts without codable children: Secondary | ICD-10-CM

## 2013-12-11 MED ORDER — POLYETHYLENE GLYCOL 3350 17 GM/SCOOP PO POWD: ORAL | Status: DC

## 2013-12-11 NOTE — Assessment & Plan Note (Signed)
Fixed, nontender nodule under right buttock, migrating up over last few weeks per patient - most likely fibrous tissue or fatty inclusion in muscle - ultrasound - consider surgery referral for biopsy depending on ultrasound result

## 2013-12-11 NOTE — Patient Instructions (Addendum)
Thank you for coming in today. I would like to get an ultrasound to find out what the knot on your leg is. I would also like to get a few blood tests to check on your overall health.   Your blood pressure today is 134/83, which is normal.  Thank you,  Dr. Sherril Cong

## 2013-12-11 NOTE — Progress Notes (Signed)
   Subjective:    Patient ID: Allison Mcintyre, female    DOB: 20-Oct-1960, 53 y.o.   MRN: [id number]  Hypertension Pertinent negatives include no chest pain or shortness of breath.   Pt presents for follow up of elevated BP. She is not on any treatment. She is asymptomatic. She had one BP in hypertensive range last year.   She also is complaining of a know that she first noticed on the back of her right thigh, just above her knee, a few weeks ago. She reports that since that time it has been moving up towards buttock and is not right at the crease under her right buttock. She has never had anything else like this and doesn't have any other lumps that she has noticed.    Review of Systems  Constitutional: Negative for fever.  Respiratory: Negative for cough and shortness of breath.   Cardiovascular: Negative for chest pain.  Musculoskeletal: Negative for arthralgias and joint swelling.  All other systems reviewed and are negative.      Objective:   Physical Exam  Nursing note and vitals reviewed. Constitutional: She is oriented to person, place, and time. She appears well-developed and well-nourished. No distress.  HENT:  Head: Normocephalic and atraumatic.  Eyes: Conjunctivae are normal. Right eye exhibits no discharge. Left eye exhibits no discharge. No scleral icterus.  Cardiovascular: Normal rate, regular rhythm and normal heart sounds.   No murmur heard. Pulmonary/Chest: Effort normal.  Musculoskeletal: Normal range of motion. She exhibits no edema and no tenderness.       Legs: Neurological: She is alert and oriented to person, place, and time.  Skin: Skin is warm and dry. She is not diaphoretic.  Psychiatric: She has a normal mood and affect. Her behavior is normal.          Assessment & Plan:

## 2013-12-11 NOTE — Assessment & Plan Note (Signed)
134/83 today, no meds, one prior reading of 037 systolic - continue to monitor

## 2013-12-12 ENCOUNTER — Ambulatory Visit
Admission: RE | Admit: 2013-12-12 | Discharge: 2013-12-12 | Disposition: A | Discharge status: Home / Self Care | Payer: PRIVATE HEALTH INSURANCE | Source: Ambulatory Visit | Attending: Family Medicine | Admitting: Family Medicine

## 2013-12-12 DIAGNOSIS — R2241 Localized swelling, mass and lump, right lower limb: Secondary | ICD-10-CM

## 2013-12-12 LAB — LIPID PANEL
Cholesterol: 183 mg/dL (ref 0–200)
HDL: 50 mg/dL (ref >39)
LDL CALC: 104 mg/dL — AB (ref 0–99)
TRIGLYCERIDES: 145 mg/dL (ref <150)
Total CHOL/HDL Ratio: 3.7 Ratio
VLDL: 29 mg/dL (ref 0–40)

## 2013-12-12 LAB — HEPATITIS C ANTIBODY, REFLEX: HCV Ab: NEGATIVE

## 2013-12-12 LAB — HIV ANTIBODY (ROUTINE TESTING W REFLEX): HIV 1&2 Ab, 4th Generation: NONREACTIVE

## 2013-12-18 ENCOUNTER — Telehealth: Payer: Self-pay | Admitting: *Deleted

## 2013-12-18 NOTE — Telephone Encounter (Signed)
Spoke with patietn and informed her of below, she will watch the size and if becomes painful

## 2013-12-18 NOTE — Telephone Encounter (Signed)
Message copied by Johny Shears on Mon Dec 18, 2013  2:24 PM ------      Message from: Frazier Richards      Created: Mon Dec 18, 2013  1:25 PM       Please inform patient that her ultrasound showed a fatty lump that is not dangerous and does not require any other tests or treatments at this time. If it gets significantly larger or becomes painful then she should return to clinic to have it rechecked. Thanks! ------

## 2014-02-12 ENCOUNTER — Ambulatory Visit (AMBULATORY_SURGERY_CENTER): Payer: Self-pay

## 2014-02-12 VITALS — Ht 66.0 in | Wt 255.0 lb

## 2014-02-12 DIAGNOSIS — Z1211 Encounter for screening for malignant neoplasm of colon: Secondary | ICD-10-CM

## 2014-02-12 MED ORDER — MOVIPREP 100 G PO SOLR: 1.0000 | Freq: Once | ORAL | Status: DC

## 2014-02-12 NOTE — Progress Notes (Signed)
No allergies to eggs or soy No diet/weight loss meds No home oxygen No past problem with anesthesia  No email

## 2014-02-26 ENCOUNTER — Ambulatory Visit (AMBULATORY_SURGERY_CENTER): Payer: PRIVATE HEALTH INSURANCE | Admitting: Internal Medicine

## 2014-02-26 ENCOUNTER — Encounter: Payer: Self-pay | Admitting: Internal Medicine

## 2014-02-26 VITALS — BP 141/74 | HR 57 | Temp 96.3°F | Resp 20 | Ht 66.0 in | Wt 255.0 lb

## 2014-02-26 DIAGNOSIS — D126 Benign neoplasm of colon, unspecified: Secondary | ICD-10-CM | POA: Diagnosis not present

## 2014-02-26 DIAGNOSIS — Z1211 Encounter for screening for malignant neoplasm of colon: Secondary | ICD-10-CM | POA: Diagnosis not present

## 2014-02-26 DIAGNOSIS — D124 Benign neoplasm of descending colon: Secondary | ICD-10-CM

## 2014-02-26 MED ORDER — SODIUM CHLORIDE 0.9 % IV SOLN: 500.0000 mL | INTRAVENOUS | Status: DC

## 2014-02-26 NOTE — Progress Notes (Signed)
Informed CRNA,J Monday , that pt ate last @11am  yesterday.

## 2014-02-26 NOTE — Progress Notes (Signed)
Report to PACU, RN, vss, BBS= Clear.  

## 2014-02-26 NOTE — Patient Instructions (Signed)
YOU HAD AN ENDOSCOPIC PROCEDURE TODAY AT THE [facility]: Refer to the procedure report that was given to you for any specific questions about what was found during the examination.  If the procedure report does not answer your questions, please call your gastroenterologist to clarify.  If you requested that your care partner not be given the details of your procedure findings, then the procedure report has been included in a sealed envelope for you to review at your convenience later.  YOU SHOULD EXPECT: Some feelings of bloating in the abdomen. Passage of more gas than usual.  Walking can help get rid of the air that was put into your GI tract during the procedure and reduce the bloating. If you had a lower endoscopy (such as a colonoscopy or flexible sigmoidoscopy) you may notice spotting of blood in your stool or on the toilet paper. If you underwent a bowel prep for your procedure, then you may not have a normal bowel movement for a few days.  DIET: Your first meal following the procedure should be a light meal and then it is ok to progress to your normal diet.  A half-sandwich or bowl of soup is an example of a good first meal.  Heavy or fried foods are harder to digest and may make you feel nauseous or bloated.  Likewise meals heavy in dairy and vegetables can cause extra gas to form and this can also increase the bloating.  Drink plenty of fluids but you should avoid alcoholic beverages for 24 hours.  ACTIVITY: Your care partner should take you home directly after the procedure.  You should plan to take it easy, moving slowly for the rest of the day.  You can resume normal activity the day after the procedure however you should NOT DRIVE or use heavy machinery for 24 hours (because of the sedation medicines used during the test).    SYMPTOMS TO REPORT IMMEDIATELY: A gastroenterologist can be reached at any hour.  During normal business hours, 8:30 AM to 5:00 PM Monday through Friday,  call [phone].  After hours and on weekends, please call the GI answering service at [phone] who will take a message and have the physician on call contact you.   Following lower endoscopy (colonoscopy or flexible sigmoidoscopy):  Excessive amounts of blood in the stool  Significant tenderness or worsening of abdominal pains  Swelling of the abdomen that is new, acute  Fever of 100F or higher  FOLLOW UP: If any biopsies were taken you will be contacted by phone or by letter within the next 1-3 weeks.  Call your gastroenterologist if you have not heard about the biopsies in 3 weeks.  Our staff will call the home number listed on your records the next business day following your procedure to check on you and address any questions or concerns that you may have at that time regarding the information given to you following your procedure. This is a courtesy call and so if there is no answer at the home number and we have not heard from you through the emergency physician on call, we will assume that you have returned to your regular daily activities without incident.  SIGNATURES/CONFIDENTIALITY: You and/or your care partner have signed paperwork which will be entered into your electronic medical record.  These signatures attest to the fact that that the information above on your After Visit Summary has been reviewed and is understood.  Full responsibility of the confidentiality of this   discharge information lies with you and/or your care-partner. Await pathology  Continue your normal medications  Please read over handout about polyps  

## 2014-02-26 NOTE — Progress Notes (Signed)
Called to room to assist during endoscopic procedure.  Patient ID and intended procedure confirmed with present staff. Received instructions for my participation in the procedure from the performing physician.  

## 2014-02-26 NOTE — Op Note (Signed)
[place]  [address] [place], [id number]   COLONOSCOPY PROCEDURE REPORT  PATIENT: Allison Mcintyre, Allison Mcintyre  MR#: [id number] BIRTHDATE: 1961/05/18 , 52  yrs. old GENDER: Female ENDOSCOPIST: Jerene Bears, MD REFERRED BY: Beverlyn Roux, MD PROCEDURE DATE:  02/26/2014 PROCEDURE:   Colonoscopy with snare polypectomy First Screening Colonoscopy - Avg.  risk and is 50 yrs.  old or older Yes.  Prior Negative Screening - Now for repeat screening. N/A  History of Adenoma - Now for follow-up colonoscopy & has been > or = to 3 yrs.  N/A  Polyps Removed Today? Yes. ASA CLASS:   Class II INDICATIONS:average risk screening and first colonoscopy. MEDICATIONS: MAC sedation, administered by CRNA and propofol (Diprivan) 300mg  IV  DESCRIPTION OF PROCEDURE:   After the risks benefits and alternatives of the procedure were thoroughly explained, informed consent was obtained.  A digital rectal exam revealed no rectal mass.   The LB [id number]-[id number]  endoscope was introduced through the anus and advanced to the terminal ileum which was intubated for a short distance. No adverse events experienced.   The quality of the prep was good, using MoviPrep  The instrument was then slowly withdrawn as the colon was fully examined.   COLON FINDINGS: The mucosa appeared normal in the terminal ileum. A sessile polyp measuring 5 mm in size was found in the descending colon.  A polypectomy was performed with a cold snare.  The resection was complete and the polyp tissue was completely retrieved.   The colon mucosa was otherwise normal.  Retroflexed views revealed internal hemorrhoids. The time to cecum=3 minutes 18 seconds.  Withdrawal time=11 minutes 02 seconds.  The scope was withdrawn and the procedure completed. COMPLICATIONS: There were no complications.  ENDOSCOPIC IMPRESSION: 1.   Normal mucosa in the terminal ileum 2.   Sessile polyp measuring 5 mm in size was found in the descending  colon; polypectomy was performed with a cold snare 3.   The colon mucosa was otherwise normal  RECOMMENDATIONS: 1.  Await pathology results 2.  If the polyp removed today is proven to be an adenomatous (pre-cancerous) polyp, you will need a repeat colonoscopy in 5 years.  Otherwise you should continue to follow colorectal cancer screening guidelines for "routine risk" patients with colonoscopy in 10 years.  You will receive a letter within 1-2 weeks with the results of your biopsy as well as final recommendations.  Please call my office if you have not received a letter after 3 weeks.   eSigned:  Jerene Bears, MD 02/26/2014 9:03 AM  cc: The Patient; Beverlyn Roux, MD

## 2014-02-27 ENCOUNTER — Telehealth: Payer: Self-pay | Admitting: *Deleted

## 2014-02-27 NOTE — Telephone Encounter (Signed)
  Follow up Call-  Call back number 02/26/2014  Post procedure Call Back phone  # [phone]  Permission to leave phone message Yes     Patient questions:  Do you have a fever, pain , or abdominal swelling? No. Pain Score  0 *  Have you tolerated food without any problems? Yes.    Have you been able to return to your normal activities? Yes.    Do you have any questions about your discharge instructions: Diet   No. Medications  No. Follow up visit  No.  Do you have questions or concerns about your Care? No.  Actions: * If pain score is 4 or above: No action needed, pain <4.

## 2014-03-02 ENCOUNTER — Encounter: Payer: Self-pay | Admitting: Internal Medicine

## 2014-04-16 ENCOUNTER — Encounter: Payer: Self-pay | Admitting: Internal Medicine

## 2014-05-07 ENCOUNTER — Other Ambulatory Visit: Payer: Self-pay | Admitting: Obstetrics and Gynecology

## 2014-05-07 DIAGNOSIS — Z1231 Encounter for screening mammogram for malignant neoplasm of breast: Secondary | ICD-10-CM

## 2014-05-18 ENCOUNTER — Ambulatory Visit (HOSPITAL_COMMUNITY): Payer: PRIVATE HEALTH INSURANCE | Attending: Obstetrics and Gynecology

## 2014-08-09 ENCOUNTER — Ambulatory Visit (HOSPITAL_COMMUNITY): Payer: PRIVATE HEALTH INSURANCE | Attending: Obstetrics and Gynecology

## 2014-08-23 ENCOUNTER — Ambulatory Visit (HOSPITAL_COMMUNITY)
Admission: RE | Admit: 2014-08-23 | Discharge: 2014-08-23 | Disposition: A | Discharge status: Home / Self Care | Payer: PRIVATE HEALTH INSURANCE | Source: Ambulatory Visit | Attending: Obstetrics and Gynecology | Admitting: Obstetrics and Gynecology

## 2014-08-23 DIAGNOSIS — Z1231 Encounter for screening mammogram for malignant neoplasm of breast: Secondary | ICD-10-CM | POA: Insufficient documentation

## 2014-10-04 ENCOUNTER — Ambulatory Visit (INDEPENDENT_AMBULATORY_CARE_PROVIDER_SITE_OTHER): Payer: PRIVATE HEALTH INSURANCE | Admitting: Family Medicine

## 2014-10-04 ENCOUNTER — Encounter: Payer: Self-pay | Admitting: Family Medicine

## 2014-10-04 ENCOUNTER — Ambulatory Visit (HOSPITAL_COMMUNITY)
Admission: RE | Admit: 2014-10-04 | Discharge: 2014-10-04 | Disposition: A | Discharge status: Home / Self Care | Payer: PRIVATE HEALTH INSURANCE | Source: Ambulatory Visit | Attending: Family Medicine | Admitting: Family Medicine

## 2014-10-04 VITALS — BP 135/74 | HR 69 | Temp 98.3°F | Ht 66.0 in | Wt 263.0 lb

## 2014-10-04 DIAGNOSIS — R002 Palpitations: Secondary | ICD-10-CM | POA: Insufficient documentation

## 2014-10-04 DIAGNOSIS — I872 Venous insufficiency (chronic) (peripheral): Secondary | ICD-10-CM

## 2014-10-04 NOTE — Assessment & Plan Note (Addendum)
No CP, SOB, happens ~2x/week - EKG with inferior T-wave inversions but no signs of arrhythmia - refer to cardiology for further evaluation and possible event monitor

## 2014-10-04 NOTE — Patient Instructions (Signed)
For your heart flutter, I would like to send you to a cardiologist who may do a monitor to see what your heart is doing when you have these flutter feelings.

## 2014-10-04 NOTE — Assessment & Plan Note (Addendum)
Leg swelling, worse after working on her feet all day, minimal and symmetric on exam - elevation and compression

## 2014-10-05 NOTE — Progress Notes (Signed)
   Subjective:    Patient ID: Allison Mcintyre, female    DOB: 03-01-1961, 54 y.o.   MRN: [id number]  HPI Pt presents for eval of leg swelling and palpitation. Both have been present for a few months.  Leg swelling is both legs and feet. She says it is most pronounced after working on her feet all day. There is no pain or redness associated with it.  The palpitations happen sporadically without warning or inciting event. It happens about twice a week and just feels like a flutter in her chest. She denies chest pain or shortness of breath.  Review of Systems See HPI    Objective:   Physical Exam  Constitutional: She is oriented to person, place, and time. She appears well-developed and well-nourished. No distress.  HENT:  Head: Normocephalic and atraumatic.  Eyes: Conjunctivae are normal. Right eye exhibits no discharge. Left eye exhibits no discharge. No scleral icterus.  Cardiovascular: Normal rate, regular rhythm, normal heart sounds and intact distal pulses.   No murmur heard. Pulmonary/Chest: Effort normal and breath sounds normal. No respiratory distress. She has no wheezes.  Abdominal: She exhibits no distension.  Musculoskeletal:  Trace bilateral LE edema to mid-shin  Neurological: She is alert and oriented to person, place, and time.  Skin: Skin is warm and dry. No rash noted. She is not diaphoretic.  Psychiatric: She has a normal mood and affect. Her behavior is normal.  Nursing note and vitals reviewed.         Assessment & Plan:

## 2014-11-07 NOTE — Progress Notes (Signed)
ERron

## 2014-11-09 ENCOUNTER — Encounter: Payer: PRIVATE HEALTH INSURANCE | Admitting: Cardiovascular Disease

## 2015-01-31 ENCOUNTER — Ambulatory Visit: Payer: PRIVATE HEALTH INSURANCE | Admitting: Cardiovascular Disease

## 2015-03-05 ENCOUNTER — Other Ambulatory Visit: Payer: Self-pay | Admitting: Family Medicine

## 2015-03-05 DIAGNOSIS — M7661 Achilles tendinitis, right leg: Secondary | ICD-10-CM

## 2015-03-05 MED ORDER — IBUPROFEN 600 MG PO TABS: 600.0000 mg | ORAL_TABLET | Freq: Three times a day (TID) | ORAL | Status: DC | PRN

## 2015-03-05 NOTE — Telephone Encounter (Signed)
Needs ibuprofen refilled ---800mg  [facility] [address]

## 2015-03-13 NOTE — Progress Notes (Signed)
Patient ID: [name], female   DOB: 1960-11-14, 54 y.o.   MRN: [id number]     Cardiology Office Note   Date:  03/14/2015   ID:  Allison Mcintyre, DOB 09-07-1960, MRN [id number]  PCP:  Beverlyn Roux, MD  Cardiologist:   Jenkins Rouge, MD   No chief complaint on file.     History of Present Illness: Allison Mcintyre is a 54 y.o. female who presents for palpitations.  Current every day smoker  Saw primary 10/05/14 and complained of dependant edema and palpitations swelling and palpitation. Both have been present for a few months.  Leg swelling is both legs and feet. She says it is most pronounced after working on her feet all day. There is no pain or redness associated with it.  The palpitations happen sporadically without warning or inciting event. It happens about twice a week and just feels like a flutter in her chest. She denies chest pain or shortness of breath. She has not had any palpitations since April.  Denies excess caffeine drugs  No thyroid issues      Past Medical History  Diagnosis Date  . Menorrhagia     Past Surgical History  Procedure Laterality Date  . Abdominal hysterectomy  2003  . Cesarean section       Current Outpatient Prescriptions  Medication Sig Dispense Refill  . ibuprofen (ADVIL,MOTRIN) 600 MG tablet Take 600 mg by mouth every 6 (six) hours as needed for headache, mild pain or moderate pain.     No current facility-administered medications for this visit.    Allergies:   Review of patient's allergies indicates no known allergies.    Social History:  The patient  reports that she has been smoking Cigarettes.  She has been smoking about 0.25 packs per day. She has never used smokeless tobacco. She reports that she drinks about 1.8 oz of alcohol per week. She reports that she does not use illicit drugs.   Family History:  The patient's family history includes Bronchitis in her father; Cancer in her mother. There is no history of Colon  polyps.    ROS:  Please see the history of present illness.   Otherwise, review of systems are positive for none.   All other systems are reviewed and negative.    PHYSICAL EXAM: VS:  BP 140/79 mmHg  Pulse 69  Ht 5\' 5"  (1.651 m)  Wt 118.117 kg (260 lb 6.4 oz)  BMI 43.33 kg/m2 , BMI Body mass index is 43.33 kg/(m^2). Affect appropriate Obese black female  HEENT: normal Neck supple with no adenopathy JVP normal no bruits no thyromegaly Lungs clear with no wheezing and good diaphragmatic motion Heart:  S1/S2 no murmur, no rub, gallop or click PMI normal Abdomen: benighn, BS positve, no tenderness, no AAA no bruit.  No HSM or HJR Distal pulses intact with no bruits Plus one bilateral edema Neuro non-focal Skin warm and dry No muscular weakness    EKG:   10/04/14 SR normal QT  Inferiori T wave changes    Recent Labs: No results found for requested labs within last 365 days.    Lipid Panel    Component Value Date/Time   CHOL 183 12/11/2013 1153   TRIG 145 12/11/2013 1153   HDL 50 12/11/2013 1153   CHOLHDL 3.7 12/11/2013 1153   VLDL 29 12/11/2013 1153   LDLCALC 104* 12/11/2013 1153      Wt Readings from Last 3 Encounters:  03/14/15 118.117 kg (  260 lb 6.4 oz)  10/04/14 119.296 kg (263 lb)  02/26/14 115.667 kg (255 lb)      Other studies Reviewed: Additional studies/ records that were reviewed today include:  Epic records/ECG.    ASSESSMENT AND PLAN:  1.  Palpitations: resolved benign nonspecfic T wave changes on ECG no high risk family history f/u echo Will also get basic labs to r/o low K, anemia and thyroid disease 2. Edema:  Related to obesity and salt in diet discussed  Called in HCTZ 12.5 mg PRN 3. Smoking : counseled on cessation and relation of nicotine to palpitations does not seem motivated to quit   Echo Labs F/U with me PRN   Current medicines are reviewed at length with the patient today.  The patient does not have concerns regarding  medicines.  The following changes have been made:  HCTZ 12.5 PRN  Labs/ tests ordered today include:  BMET, Hct, TSH/T4  No orders of the defined types were placed in this encounter.     Disposition:   FU with me PRN     Signed, Jenkins Rouge, MD  03/14/2015 4:24 PM    [facility] [place], [place],   [address] Phone: [phone]; Fax: [phone]

## 2015-03-14 ENCOUNTER — Encounter: Payer: Self-pay | Admitting: Cardiovascular Disease

## 2015-03-14 ENCOUNTER — Ambulatory Visit (INDEPENDENT_AMBULATORY_CARE_PROVIDER_SITE_OTHER): Payer: PRIVATE HEALTH INSURANCE | Admitting: Cardiovascular Disease

## 2015-03-14 VITALS — BP 140/79 | HR 69 | Ht 65.0 in | Wt 260.4 lb

## 2015-03-14 DIAGNOSIS — R002 Palpitations: Secondary | ICD-10-CM

## 2015-03-14 MED ORDER — HYDROCHLOROTHIAZIDE 12.5 MG PO CAPS: 12.5000 mg | ORAL_CAPSULE | Freq: Every day | ORAL | Status: DC | PRN

## 2015-03-14 NOTE — Patient Instructions (Signed)
Medication Instructions:  HCTZ  12.5 MG   1  DAILY AS NEEDED  Labwork: TODAY  BMET  CBC   TSH   T4  Testing/Procedures: Your physician has requested that you have an echocardiogram. Echocardiography is a painless test that uses sound waves to create images of your heart. It provides your doctor with information about the size and shape of your heart and how well your heart's chambers and valves are working. This procedure takes approximately one hour. There are no restrictions for this procedure.   Follow-Up: Your physician recommends that you schedule a follow-up appointment in: AS NEEDED  Any Other Special Instructions Will Be Listed Below (If Applicable).

## 2015-03-15 LAB — T4, FREE: Free T4: 0.91 ng/dL (ref 0.60–1.60)

## 2015-03-15 LAB — CBC WITH DIFFERENTIAL/PLATELET
BASOS ABS: 0 10*3/uL (ref 0.0–0.1)
Basophils Relative: 0.3 % (ref 0.0–3.0)
Eosinophils Absolute: 0.2 10*3/uL (ref 0.0–0.7)
Eosinophils Relative: 1.7 % (ref 0.0–5.0)
HCT: 35.8 % — ABNORMAL LOW (ref 36.0–46.0)
Hemoglobin: 11.6 g/dL — ABNORMAL LOW (ref 12.0–15.0)
LYMPHS ABS: 3.2 10*3/uL (ref 0.7–4.0)
Lymphocytes Relative: 34 % (ref 12.0–46.0)
MCHC: 32.5 g/dL (ref 30.0–36.0)
MCV: 95.9 fl (ref 78.0–100.0)
MONO ABS: 0.4 10*3/uL (ref 0.1–1.0)
Monocytes Relative: 4.2 % (ref 3.0–12.0)
NEUTROS ABS: 5.6 10*3/uL (ref 1.4–7.7)
NEUTROS PCT: 59.8 % (ref 43.0–77.0)
Platelets: 333 10*3/uL (ref 150.0–400.0)
RBC: 3.73 Mil/uL — AB (ref 3.87–5.11)
RDW: 14.1 % (ref 11.5–15.5)
WBC: 9.4 10*3/uL (ref 4.0–10.5)

## 2015-03-15 LAB — BASIC METABOLIC PANEL
BUN: 23 mg/dL (ref 6–23)
CO2: 21 meq/L (ref 19–32)
CREATININE: 0.85 mg/dL (ref 0.40–1.20)
Calcium: 9.4 mg/dL (ref 8.4–10.5)
Chloride: 108 mEq/L (ref 96–112)
GFR: 89 mL/min (ref >60.00)
GLUCOSE: 86 mg/dL (ref 70–99)
Potassium: 3.8 mEq/L (ref 3.5–5.1)
Sodium: 141 mEq/L (ref 135–145)

## 2015-03-15 LAB — TSH: TSH: 1.15 u[IU]/mL (ref 0.35–4.50)

## 2015-03-28 ENCOUNTER — Encounter: Payer: Self-pay | Admitting: Family Medicine

## 2015-03-28 ENCOUNTER — Ambulatory Visit (INDEPENDENT_AMBULATORY_CARE_PROVIDER_SITE_OTHER): Payer: PRIVATE HEALTH INSURANCE | Admitting: Family Medicine

## 2015-03-28 VITALS — BP 124/96 | HR 102 | Temp 98.3°F | Ht 65.0 in | Wt 254.0 lb

## 2015-03-28 DIAGNOSIS — D649 Anemia, unspecified: Secondary | ICD-10-CM | POA: Diagnosis not present

## 2015-03-28 DIAGNOSIS — M25561 Pain in right knee: Secondary | ICD-10-CM | POA: Insufficient documentation

## 2015-03-28 MED ORDER — DICLOFENAC SODIUM 1 % TD CREA: 1.0000 "application " | TOPICAL_CREAM | Freq: Three times a day (TID) | TRANSDERMAL | Status: DC | PRN

## 2015-03-28 NOTE — Patient Instructions (Signed)
Anemia, Nonspecific Anemia is a condition in which the concentration of red blood cells or hemoglobin in the blood is below normal. Hemoglobin is a substance in red blood cells that carries oxygen to the tissues of the body. Anemia results in not enough oxygen reaching these tissues.  CAUSES  Common causes of anemia include:   Excessive bleeding. Bleeding may be internal or external. This includes excessive bleeding from periods (in women) or from the intestine.   Poor nutrition.   Chronic kidney, thyroid, and liver disease.  Bone marrow disorders that decrease red blood cell production.  Cancer and treatments for cancer.  HIV, AIDS, and their treatments.  Spleen problems that increase red blood cell destruction.  Blood disorders.  Excess destruction of red blood cells due to infection, medicines, and autoimmune disorders. SIGNS AND SYMPTOMS   Minor weakness.   Dizziness.   Headache.  Palpitations.   Shortness of breath, especially with exercise.   Paleness.  Cold sensitivity.  Indigestion.  Nausea.  Difficulty sleeping.  Difficulty concentrating. Symptoms may occur suddenly or they may develop slowly.  DIAGNOSIS  Additional blood tests are often needed. These help your health care provider determine the best treatment. Your health care provider will check your stool for blood and look for other causes of blood loss.  TREATMENT  Treatment varies depending on the cause of the anemia. Treatment can include:   Supplements of iron, vitamin B12, or folic acid.   Hormone medicines.   A blood transfusion. This may be needed if blood loss is severe.   Hospitalization. This may be needed if there is significant continual blood loss.   Dietary changes.  Spleen removal. HOME CARE INSTRUCTIONS Keep all follow-up appointments. It often takes many weeks to correct anemia, and having your health care provider check on your condition and your response to  treatment is very important. SEEK IMMEDIATE MEDICAL CARE IF:   You develop extreme weakness, shortness of breath, or chest pain.   You become dizzy or have trouble concentrating.  You develop heavy vaginal bleeding.   You develop a rash.   You have bloody or black, tarry stools.   You faint.   You vomit up blood.   You vomit repeatedly.   You have abdominal pain.  You have a fever or persistent symptoms for more than 2-3 days.   You have a fever and your symptoms suddenly get worse.   You are dehydrated.  MAKE SURE YOU:  Understand these instructions.  Will watch your condition.  Will get help right away if you are not doing well or get worse.   This information is not intended to replace advice given to you by your health care provider. Make sure you discuss any questions you have with your health care provider.   Document Released: 07/09/2004 Document Revised: 02/01/2013 Document Reviewed: 11/25/2012 Elsevier Interactive Patient Education 2016 [employer] Inc.  

## 2015-04-03 DIAGNOSIS — D649 Anemia, unspecified: Secondary | ICD-10-CM | POA: Insufficient documentation

## 2015-04-03 NOTE — Assessment & Plan Note (Signed)
Mild asymptomatic anemia to 11.6 - no further work-up indicated at this time, patient agreeable to this plan - will monitor with CBC in 6 months or sooner for symptoms or bleeding

## 2015-04-03 NOTE — Progress Notes (Signed)
   Subjective:   Allison Mcintyre is a 54 y.o. female with a history of venous insuficiency, palpitations here for anemia and knee pain  Patient reports she recently had blood work done by Dr. Johnsie Cancel with cardiology that revealed some anemia and she was told to see her PCP for this. She has no dizziness, lightheadedness, fatigue, pallor or other symptoms. She has no rectal bleeding and is not on a blood thinner. She eats a well balanced diet including plenty of greens and liver.  Patient also repeats right knee pain that is dull and achy and has come and gone for several years. Has been using a cream from her daughter that helps (sounds like nsaid cream from her description). It is not severely bothersome and she just wants to continue treating it with this cream without further work-up at this time.  Review of Systems:  Per HPI. All other systems reviewed and are negative.   PMH, PSH, Medications, Allergies, and FmHx reviewed and updated in EMR.  Social History: current smoker  Objective:  BP 124/96 mmHg  Pulse 102  Temp(Src) 98.3 F (36.8 C) (Oral)  Ht 5\' 5"  (1.651 m)  Wt 254 lb (115.214 kg)  BMI 42.27 kg/m2  Gen:  54 y.o. female in NAD HEENT: NCAT, MMM, EOMI, PERRL, anicteric sclerae CV: RRR, no MRG, no JVD Resp: Non-labored, CTAB, no wheezes noted Abd: Soft, NTND, BS present, no guarding or organomegaly Ext: WWP, no edema MSK: Full ROM, strength intact. R knee normal, no tenderness, swelling. Neuro: Alert and oriented, speech normal      Chemistry      Component Value Date/Time   NA 141 03/14/2015 1631   K 3.8 03/14/2015 1631   CL 108 03/14/2015 1631   CO2 21 03/14/2015 1631   BUN 23 03/14/2015 1631   CREATININE 0.85 03/14/2015 1631      Component Value Date/Time   CALCIUM 9.4 03/14/2015 1631      Lab Results  Component Value Date   WBC 9.4 03/14/2015   HGB 11.6* 03/14/2015   HCT 35.8* 03/14/2015   MCV 95.9 03/14/2015   PLT 333.0 03/14/2015   Lab  Results  Component Value Date   TSH 1.15 03/14/2015   No results found for: HGBA1C Assessment & Plan:     MATIE Mcintyre is a 54 y.o. female here for anemia and knee pain  Anemia Mild asymptomatic anemia to 11.6 - no further work-up indicated at this time, patient agreeable to this plan - will monitor with CBC in 6 months or sooner for symptoms or bleeding  Knee pain, right Mild OA - continue voltaren topical and ibuprofen prn - f/u for worsening       Beverlyn Roux, MD, MPH [facility] PGY-3 04/03/2015 4:49 PM

## 2015-04-03 NOTE — Assessment & Plan Note (Signed)
Mild OA - continue voltaren topical and ibuprofen prn - f/u for worsening

## 2015-04-04 ENCOUNTER — Other Ambulatory Visit: Payer: Self-pay

## 2015-04-04 ENCOUNTER — Ambulatory Visit (HOSPITAL_COMMUNITY): Payer: PRIVATE HEALTH INSURANCE | Attending: Cardiology

## 2015-04-04 DIAGNOSIS — E669 Obesity, unspecified: Secondary | ICD-10-CM | POA: Insufficient documentation

## 2015-04-04 DIAGNOSIS — R002 Palpitations: Secondary | ICD-10-CM | POA: Diagnosis not present

## 2015-04-04 DIAGNOSIS — Z6841 Body Mass Index (BMI) 40.0 and over, adult: Secondary | ICD-10-CM | POA: Diagnosis not present

## 2015-04-04 DIAGNOSIS — F17200 Nicotine dependence, unspecified, uncomplicated: Secondary | ICD-10-CM | POA: Diagnosis not present

## 2015-07-18 ENCOUNTER — Ambulatory Visit: Payer: PRIVATE HEALTH INSURANCE | Admitting: Family Medicine

## 2015-08-23 ENCOUNTER — Other Ambulatory Visit: Payer: Self-pay | Admitting: Family Medicine

## 2015-08-25 ENCOUNTER — Other Ambulatory Visit: Payer: Self-pay | Admitting: Family Medicine

## 2015-08-25 DIAGNOSIS — M150 Primary generalized (osteo)arthritis: Principal | ICD-10-CM

## 2015-08-25 DIAGNOSIS — M159 Polyosteoarthritis, unspecified: Secondary | ICD-10-CM

## 2015-08-25 MED ORDER — CELECOXIB 100 MG PO CAPS: 100.0000 mg | ORAL_CAPSULE | Freq: Two times a day (BID) | ORAL | Status: DC | PRN

## 2015-09-06 ENCOUNTER — Ambulatory Visit (INDEPENDENT_AMBULATORY_CARE_PROVIDER_SITE_OTHER): Payer: PRIVATE HEALTH INSURANCE | Admitting: Family Medicine

## 2015-09-06 ENCOUNTER — Encounter: Payer: Self-pay | Admitting: Family Medicine

## 2015-09-06 VITALS — BP 146/80 | HR 75 | Temp 98.2°F | Ht 65.0 in | Wt 260.0 lb

## 2015-09-06 DIAGNOSIS — R2241 Localized swelling, mass and lump, right lower limb: Secondary | ICD-10-CM

## 2015-09-08 NOTE — Assessment & Plan Note (Addendum)
Fixed, nontender nodule under right buttock, migrating up over last few months per patient although documented in same location over a year ago - most likely fibrous tissue or fatty inclusion in muscle - ultrasound - consider surgery referral for biopsy depending on ultrasound result

## 2015-09-08 NOTE — Progress Notes (Signed)
   Subjective:    Patient ID: Allison Mcintyre, female    DOB: 09-15-1960, 55 y.o.   MRN: [id number]  HPI Pt presents for a lump that she first noticed on the back of her right thigh, just above her knee, several months ago. She reports that since that time it has been moving up towards buttock and is now right at the crease under her right buttock. She has never had anything else like this and doesn't have any other lumps that she has noticed. It doesn't hurt and isn't changing   Review of Systems  Constitutional: Negative for fever.  Respiratory: Negative for cough.   Musculoskeletal: Negative for joint swelling and arthralgias.  All other systems reviewed and are negative.      Objective:   Physical Exam  Constitutional: She is oriented to person, place, and time. She appears well-developed and well-nourished. No distress.  HENT:  Head: Normocephalic and atraumatic.  Eyes: Conjunctivae are normal. Right eye exhibits no discharge. Left eye exhibits no discharge. No scleral icterus.  Cardiovascular: Normal rate, regular rhythm and normal heart sounds.   No murmur heard. Pulmonary/Chest: Effort normal.  Musculoskeletal: Normal range of motion. She exhibits no edema or tenderness.       Legs: Neurological: She is alert and oriented to person, place, and time.  Skin: Skin is warm and dry. She is not diaphoretic.  Psychiatric: She has a normal mood and affect. Her behavior is normal.  Nursing note and vitals reviewed.     Assessment & Plan:  Mass of right thigh Fixed, nontender nodule under right buttock, migrating up over last few months per patient although documented in same location over a year ago - most likely fibrous tissue or fatty inclusion in muscle - ultrasound - consider surgery referral for biopsy depending on ultrasound result

## 2015-09-10 ENCOUNTER — Ambulatory Visit (HOSPITAL_COMMUNITY): Payer: PRIVATE HEALTH INSURANCE

## 2015-09-27 ENCOUNTER — Ambulatory Visit (HOSPITAL_COMMUNITY): Payer: PRIVATE HEALTH INSURANCE

## 2015-10-04 ENCOUNTER — Other Ambulatory Visit: Payer: Self-pay | Admitting: Cardiovascular Disease

## 2015-10-10 ENCOUNTER — Other Ambulatory Visit (HOSPITAL_COMMUNITY): Payer: PRIVATE HEALTH INSURANCE

## 2015-10-18 ENCOUNTER — Ambulatory Visit (HOSPITAL_COMMUNITY): Payer: PRIVATE HEALTH INSURANCE

## 2015-11-08 ENCOUNTER — Other Ambulatory Visit: Payer: Self-pay | Admitting: Cardiovascular Disease

## 2016-06-23 ENCOUNTER — Other Ambulatory Visit: Payer: Self-pay | Admitting: Cardiovascular Disease

## 2016-06-24 NOTE — Telephone Encounter (Signed)
Should this be deferred to pcp as patient is PRN follow up? Please advise. Thanks, MI

## 2016-07-14 ENCOUNTER — Ambulatory Visit (INDEPENDENT_AMBULATORY_CARE_PROVIDER_SITE_OTHER): Payer: PRIVATE HEALTH INSURANCE | Admitting: Obstetrics and Gynecology

## 2016-07-14 ENCOUNTER — Encounter: Payer: Self-pay | Admitting: Obstetrics and Gynecology

## 2016-07-14 VITALS — BP 138/78 | HR 96 | Temp 98.1°F | Wt 243.0 lb

## 2016-07-14 DIAGNOSIS — M545 Low back pain, unspecified: Secondary | ICD-10-CM

## 2016-07-14 MED ORDER — CYCLOBENZAPRINE HCL 10 MG PO TABS: 10.0000 mg | ORAL_TABLET | Freq: Three times a day (TID) | ORAL | 0 refills | Status: DC | PRN

## 2016-07-14 MED ORDER — IBUPROFEN 600 MG PO TABS: 600.0000 mg | ORAL_TABLET | Freq: Three times a day (TID) | ORAL | 0 refills | Status: DC | PRN

## 2016-07-14 NOTE — Progress Notes (Signed)
   Subjective:   Patient ID: Allison Mcintyre, female    DOB: 1960/07/27, 56 y.o.   MRN: [id number]  Patient presents for Same Day Appointment  Chief Complaint  Patient presents with  . Abdominal Pain    HPI: #Back Pain A week of right-sided flank pain Pain is remaining constant Nagging in nature Never had this pain before Tylenol helps some but can still feel pain aggravated with certain movements May have worsened it while moving recently Pain radiates to around the side some History of trauma or injury: no  Symptoms Incontinence of bowel or bladder: no Numbness of leg: no Fever: no Rash: no  Review of Systems   See HPI for ROS.   History  Smoking Status  . Current Every Day Smoker  . Packs/day: 0.25  . Types: Cigarettes  Smokeless Tobacco  . Never Used    Past medical history, surgical, family, and social history reviewed and updated in the EMR as appropriate.  Objective:  BP 138/78   Pulse 96   Temp 98.1 F (36.7 C) (Oral)   Wt 243 lb (110.2 kg)   BMI 40.44 kg/m  Vitals and nursing note reviewed  Physical Exam  Constitutional: She is well-developed, well-nourished, and in no distress.  Cardiovascular: Normal rate.   Pulmonary/Chest: Effort normal.  Abdominal: Soft. She exhibits no distension. There is no tenderness. There is no guarding.   Back Exam:  Inspection: Unremarkable  Motion: Flexion 45 deg, Extension 45 deg, Side Bending to 45 deg bilaterally, Rotation to 45 deg bilaterally  SLR laying: Negative  Palpable tenderness: Yes, pinpoint tenderness of lateral right thoracic musculature  Sensory change: Gross sensation intact to all lumbar and sacral dermatomes.  Reflexes: 2+ Strength normal in lower extremities Gait unremarkable.  Assessment & Plan:  1. Acute right-sided low back pain without sciatica Acute back pain consistent with muscle strain. No red flags. Will treat conservatively for this acute episode. Patient given muscle  relaxant and NSAIDs. Back exercises also given. Patient to return to clinic in 4-6 weeks and follow with PCP if symptoms not improved.    Meds ordered this encounter  Medications  . cyclobenzaprine (FLEXERIL) 10 MG tablet    Sig: Take 1 tablet (10 mg total) by mouth 3 (three) times daily as needed for muscle spasms.    Dispense:  30 tablet    Refill:  0  . ibuprofen (ADVIL,MOTRIN) 600 MG tablet    Sig: Take 1 tablet (600 mg total) by mouth every 8 (eight) hours as needed for moderate pain.    Dispense:  30 tablet    Refill:  0     PATIENT EDUCATION PROVIDED: See AVS   Luiz Blare, DO 07/14/2016, 11:08 AM PGY-3, [place]

## 2016-07-14 NOTE — Patient Instructions (Addendum)
Muscle Strain A muscle strain (pulled muscle) happens when a muscle is stretched beyond normal length. It happens when a sudden, violent force stretches your muscle too far. Usually, a few of the fibers in your muscle are torn. Muscle strain is common in athletes. Recovery usually takes 1-2 weeks. Complete healing takes 5-6 weeks. Follow these instructions at home:  Follow the PRICE method of treatment to help your injury get better. Do this the first 2-3 days after the injury:  Protect. Protect the muscle to keep it from getting injured again.  Rest. Limit your activity and rest the injured body part.  Ice. Put ice in a plastic bag. Place a towel between your skin and the bag. Then, apply the ice and leave it on from 15-20 minutes each hour. After the third day, switch to moist heat packs.  Compression. Use a splint or elastic bandage on the injured area for comfort. Do not put it on too tightly.  Elevate. Keep the injured body part above the level of your heart.  Only take medicine as told by your doctor.  Warm up before doing exercise to prevent future muscle strains. Contact a doctor if:  You have more pain or puffiness (swelling) in the injured area.  You feel numbness, tingling, or notice a loss of strength in the injured area. This information is not intended to replace advice given to you by your health care provider. Make sure you discuss any questions you have with your health care provider. Document Released: 03/10/2008 Document Revised: 11/07/2015 Document Reviewed: 12/29/2012 Elsevier Interactive Patient Education  2017 [employer].   Back Exercises Introduction If you have pain in your back, do these exercises 2-3 times each day or as told by your doctor. When the pain goes away, do the exercises once each day, but repeat the steps more times for each exercise (do more repetitions). If you do not have pain in your back, do these exercises once each day or as told by  your doctor. Exercises Single Knee to Chest  Do these steps 3-5 times in a row for each leg: 1. Lie on your back on a firm bed or the floor with your legs stretched out. 2. Bring one knee to your chest. 3. Hold your knee to your chest by grabbing your knee or thigh. 4. Pull on your knee until you feel a gentle stretch in your lower back. 5. Keep doing the stretch for 10-30 seconds. 6. Slowly let go of your leg and straighten it. Pelvic Tilt  Do these steps 5-10 times in a row: 1. Lie on your back on a firm bed or the floor with your legs stretched out. 2. Bend your knees so they point up to the ceiling. Your feet should be flat on the floor. 3. Tighten your lower belly (abdomen) muscles to press your lower back against the floor. This will make your tailbone point up to the ceiling instead of pointing down to your feet or the floor. 4. Stay in this position for 5-10 seconds while you gently tighten your muscles and breathe evenly. Cat-Cow  Do these steps until your lower back bends more easily: 1. Get on your hands and knees on a firm surface. Keep your hands under your shoulders, and keep your knees under your hips. You may put padding under your knees. 2. Let your head hang down, and make your tailbone point down to the floor so your lower back is round like the back of a  cat. 3. Stay in this position for 5 seconds. 4. Slowly lift your head and make your tailbone point up to the ceiling so your back hangs low (sags) like the back of a cow. 5. Stay in this position for 5 seconds. Press-Ups  Do these steps 5-10 times in a row: 1. Lie on your belly (face-down) on the floor. 2. Place your hands near your head, about shoulder-width apart. 3. While you keep your back relaxed and keep your hips on the floor, slowly straighten your arms to raise the top half of your body and lift your shoulders. Do not use your back muscles. To make yourself more comfortable, you may change where you place your  hands. 4. Stay in this position for 5 seconds. 5. Slowly return to lying flat on the floor. Bridges  Do these steps 10 times in a row: 1. Lie on your back on a firm surface. 2. Bend your knees so they point up to the ceiling. Your feet should be flat on the floor. 3. Tighten your butt muscles and lift your butt off of the floor until your waist is almost as high as your knees. If you do not feel the muscles working in your butt and the back of your thighs, slide your feet 1-2 inches farther away from your butt. 4. Stay in this position for 3-5 seconds. 5. Slowly lower your butt to the floor, and let your butt muscles relax. If this exercise is too easy, try doing it with your arms crossed over your chest. Belly Crunches  Do these steps 5-10 times in a row: 1. Lie on your back on a firm bed or the floor with your legs stretched out. 2. Bend your knees so they point up to the ceiling. Your feet should be flat on the floor. 3. Cross your arms over your chest. 4. Tip your chin a little bit toward your chest but do not bend your neck. 5. Tighten your belly muscles and slowly raise your chest just enough to lift your shoulder blades a tiny bit off of the floor. 6. Slowly lower your chest and your head to the floor. Back Lifts  Do these steps 5-10 times in a row: 1. Lie on your belly (face-down) with your arms at your sides, and rest your forehead on the floor. 2. Tighten the muscles in your legs and your butt. 3. Slowly lift your chest off of the floor while you keep your hips on the floor. Keep the back of your head in line with the curve in your back. Look at the floor while you do this. 4. Stay in this position for 3-5 seconds. 5. Slowly lower your chest and your face to the floor. Contact a doctor if:  Your back pain gets a lot worse when you do an exercise.  Your back pain does not lessen 2 hours after you exercise. If you have any of these problems, stop doing the exercises. Do not do  them again unless your doctor says it is okay. Get help right away if:  You have sudden, very bad back pain. If this happens, stop doing the exercises. Do not do them again unless your doctor says it is okay. This information is not intended to replace advice given to you by your health care provider. Make sure you discuss any questions you have with your health care provider. Document Released: 07/04/2010 Document Revised: 11/07/2015 Document Reviewed: 07/26/2014  2017 Elsevier

## 2016-08-17 ENCOUNTER — Other Ambulatory Visit: Payer: Self-pay | Admitting: Obstetrics and Gynecology

## 2016-09-07 ENCOUNTER — Ambulatory Visit: Payer: PRIVATE HEALTH INSURANCE | Admitting: Family Medicine

## 2016-09-08 ENCOUNTER — Ambulatory Visit: Payer: PRIVATE HEALTH INSURANCE | Admitting: Internal Medicine

## 2016-10-15 ENCOUNTER — Other Ambulatory Visit: Payer: Self-pay | Admitting: Family Medicine

## 2016-10-15 MED ORDER — HYDROCHLOROTHIAZIDE 12.5 MG PO CAPS: 12.5000 mg | ORAL_CAPSULE | Freq: Every day | ORAL | 1 refills | Status: DC | PRN

## 2016-10-15 NOTE — Telephone Encounter (Signed)
Patient needs to be seen in clinic. She hasn't been seen for HTN in ~23yr.  2 mths refill provided of previous dose HCTZ.

## 2016-10-15 NOTE — Telephone Encounter (Signed)
Left message for patient to call office for appointment

## 2016-10-15 NOTE — Telephone Encounter (Signed)
Pt states she needs a refill on "water pills" (pt did not know the name of them) and she says they aren't working and needs a stronger dose. Pt use [place]. ep

## 2016-10-27 ENCOUNTER — Ambulatory Visit: Payer: PRIVATE HEALTH INSURANCE | Admitting: Family Medicine

## 2016-11-03 ENCOUNTER — Ambulatory Visit: Payer: PRIVATE HEALTH INSURANCE | Admitting: Family Medicine

## 2016-11-10 NOTE — Progress Notes (Signed)
Order > 1 y old Allison Mcintyre

## 2016-11-19 ENCOUNTER — Encounter: Payer: Self-pay | Admitting: Family Medicine

## 2016-11-19 ENCOUNTER — Ambulatory Visit (INDEPENDENT_AMBULATORY_CARE_PROVIDER_SITE_OTHER): Payer: PRIVATE HEALTH INSURANCE | Admitting: Family Medicine

## 2016-11-19 VITALS — BP 130/78 | HR 71 | Temp 98.3°F | Wt 245.0 lb

## 2016-11-19 DIAGNOSIS — R609 Edema, unspecified: Secondary | ICD-10-CM | POA: Diagnosis not present

## 2016-11-19 DIAGNOSIS — R400 Somnolence: Secondary | ICD-10-CM | POA: Diagnosis not present

## 2016-11-19 DIAGNOSIS — R5383 Other fatigue: Secondary | ICD-10-CM | POA: Diagnosis not present

## 2016-11-19 DIAGNOSIS — R600 Localized edema: Secondary | ICD-10-CM | POA: Insufficient documentation

## 2016-11-19 NOTE — Assessment & Plan Note (Signed)
Patient is here with complaints of worsening peripheral edema over the past few months. She is also endorsing some worsening fatigue over the same time period. Etiology deemed most likely venous insufficiency/prolonged upright positioning. Differential includes anemia and OSA. Patient has a history of mild anemia. She is s/p hysterectomy and denies any vaginal bleeding. Patient endorses symptoms of OSA but has no prior diagnosis. - No refill on hydrochlorothiazide today as patient's blood pressure is well controlled and any inappropriate reduction by this thiazide diuretic could predispose patient to falls - Obtaining labs today: BMP, CBC, TSH >> labs will help rule in/out anemia, kidney dysfunction, and thyroid dysfunction as a cause for edema. - I've encouraged patient to purchase compression stockings to wear while at work. Patient's response to this recommendation gives me suspicion for a low likelihood of compliance with this recommendation.  Next: Patient does not currently have health insurance. I feel the likelihood of patient having currently undiagnosed OSA is relatively high and patient would benefit from undergoing a sleep study (regardless of whether OSA is the cause for her edema). I have asked patient to come back for referral for a sleep study once she has medical insurance.

## 2016-11-19 NOTE — Patient Instructions (Signed)
It was a pleasure seeing you today in our clinic. Today we discussed your leg swelling. Here is the treatment plan we have discussed and agreed upon together:   - I feels though most of this leg swelling is likely due to you constantly being on her feet throughout the day. - I do not feel it is safe for you to be on hydrochlorothiazide to keep the swelling down, as your blood pressure is currently well controlled and it may drop your blood pressure too low. - I would recommend wearing "compression stockings" during your work days to help with the swelling in your feet. - If you develop any additional symptoms, or worsening symptoms please come back for reevaluation. - If your symptoms persist please come back once you have insurance and at that time it may be necessary to order a sleep study to check for sleep apnea. - I will mail you the results of today's labs, unless there are any surprises, in which case I will contact you by phone to discuss them.

## 2016-11-19 NOTE — Assessment & Plan Note (Signed)
See plan above.

## 2016-11-19 NOTE — Progress Notes (Signed)
   HPI  CC: LE edema, bilateral Patient is here with complaints of worsening bilateral lower extremity edema over the past 2 months. She states that she has had this issues in the past and was provided at that time hydrochlorothiazide to take as needed for the swelling. She states that she has not been able to take this medication over the past few months due to running out. She denies any pain or discomfort. She states that she just feels "swollen" and "bloated", and would like to have this problem addressed.  Patient states that she spends most of her time throughout the day on her feet. She works as a Programme researcher, broadcasting/film/video in [employer] and her swelling seems to be worse at the end of her shifts. She does not prop her legs up at night. She states that she "needs to exercise", and occasionally does some home exercises which she states she tolerates well.  Patient endorses worsening fatigue over the past few months. She also states that she snores loudly at night. She does not wake up refreshed or feeling well rested. She does not get up multiple times during the night to urinate but she does wake up frequently for reasons unknown to her.  Patient denies any fevers, chills, headache, vision changes, lightheadedness, dizziness, shortness of breath, chest pain, nausea, vomiting, diarrhea, dysuria, hematuria, hematochezia, melena, or vaginal bleeding.  Review of Systems See HPI for ROS.   CC, SH/smoking status, and VS noted  Objective: BP 130/78   Pulse 71   Temp 98.3 F (36.8 C) (Oral)   Wt 245 lb (111.1 kg)   SpO2 96%   BMI 40.77 kg/m  Gen: NAD, alert, cooperative, and pleasant. HEENT: NCAT, EOMI, PERRL, MMM CV: RRR, no murmur, cap refill <2sec, peripheral pulses strong bilat Resp: CTAB, no wheezes, non-labored Ext: +0-1 non-pitting edema in bilat LE (difficult to assess 2/2 adipose tissue), warm, nontender, full AROM Neuro: Alert and oriented, Speech clear, No gross deficits, sensation intact  throughout.   Assessment and plan:  Mild peripheral edema Patient is here with complaints of worsening peripheral edema over the past few months. She is also endorsing some worsening fatigue over the same time period. Etiology deemed most likely venous insufficiency/prolonged upright positioning. Differential includes anemia and OSA. Patient has a history of mild anemia. She is s/p hysterectomy and denies any vaginal bleeding. Patient endorses symptoms of OSA but has no prior diagnosis. - No refill on hydrochlorothiazide today as patient's blood pressure is well controlled and any inappropriate reduction by this thiazide diuretic could predispose patient to falls - Obtaining labs today: BMP, CBC, TSH >> labs will help rule in/out anemia, kidney dysfunction, and thyroid dysfunction as a cause for edema. - I've encouraged patient to purchase compression stockings to wear while at work. Patient's response to this recommendation gives me suspicion for a low likelihood of compliance with this recommendation.  Next: Patient does not currently have health insurance. I feel the likelihood of patient having currently undiagnosed OSA is relatively high and patient would benefit from undergoing a sleep study (regardless of whether OSA is the cause for her edema). I have asked patient to come back for referral for a sleep study once she has medical insurance.  Other fatigue See plan above  Daytime somnolence See above.   Orders Placed This Encounter  Procedures  . CBC  . Basic metabolic panel  . TSH    Elberta Leatherwood, MD,MS,  PGY3 11/19/2016 12:07 PM

## 2016-11-19 NOTE — Assessment & Plan Note (Signed)
See above

## 2016-11-20 ENCOUNTER — Encounter: Payer: Self-pay | Admitting: Family Medicine

## 2016-11-20 LAB — CBC
HEMOGLOBIN: 11.7 g/dL (ref 11.1–15.9)
Hematocrit: 37.2 % (ref 34.0–46.6)
MCH: 30.9 pg (ref 26.6–33.0)
MCHC: 31.5 g/dL (ref 31.5–35.7)
MCV: 98 fL — ABNORMAL HIGH (ref 79–97)
Platelets: 345 10*3/uL (ref 150–379)
RBC: 3.79 x10E6/uL (ref 3.77–5.28)
RDW: 13.2 % (ref 12.3–15.4)
WBC: 7.8 10*3/uL (ref 3.4–10.8)

## 2016-11-20 LAB — BASIC METABOLIC PANEL
BUN/Creatinine Ratio: 16 (ref 9–23)
BUN: 11 mg/dL (ref 6–24)
CO2: 24 mmol/L (ref 18–29)
CREATININE: 0.69 mg/dL (ref 0.57–1.00)
Calcium: 9.5 mg/dL (ref 8.7–10.2)
Chloride: 106 mmol/L (ref 96–106)
GFR calc Af Amer: 112 mL/min/{1.73_m2} (ref >59)
GFR calc non Af Amer: 97 mL/min/{1.73_m2} (ref >59)
GLUCOSE: 88 mg/dL (ref 65–99)
POTASSIUM: 4.6 mmol/L (ref 3.5–5.2)
SODIUM: 144 mmol/L (ref 134–144)

## 2016-11-20 LAB — TSH: TSH: 0.733 u[IU]/mL (ref 0.450–4.500)

## 2016-12-08 ENCOUNTER — Ambulatory Visit (INDEPENDENT_AMBULATORY_CARE_PROVIDER_SITE_OTHER): Payer: Self-pay | Admitting: Family Medicine

## 2016-12-08 ENCOUNTER — Telehealth: Payer: Self-pay | Admitting: Family Medicine

## 2016-12-08 ENCOUNTER — Ambulatory Visit
Admission: RE | Admit: 2016-12-08 | Discharge: 2016-12-08 | Disposition: A | Discharge status: Home / Self Care | Payer: Self-pay | Source: Ambulatory Visit | Attending: Family Medicine | Admitting: Family Medicine

## 2016-12-08 ENCOUNTER — Encounter: Payer: Self-pay | Admitting: Family Medicine

## 2016-12-08 VITALS — BP 118/66 | HR 73 | Temp 98.5°F | Ht 65.0 in | Wt 239.0 lb

## 2016-12-08 DIAGNOSIS — R0781 Pleurodynia: Secondary | ICD-10-CM

## 2016-12-08 DIAGNOSIS — W19XXXA Unspecified fall, initial encounter: Secondary | ICD-10-CM

## 2016-12-08 DIAGNOSIS — M25473 Effusion, unspecified ankle: Secondary | ICD-10-CM

## 2016-12-08 MED ORDER — TRAMADOL HCL 50 MG PO TABS: 50.0000 mg | ORAL_TABLET | Freq: Four times a day (QID) | ORAL | 0 refills | Status: DC | PRN

## 2016-12-08 MED ORDER — KETOROLAC TROMETHAMINE 30 MG/ML IJ SOLN
30.0000 mg | Freq: Once | INTRAMUSCULAR | Status: AC
  Administered 2016-12-08: 30 mg via INTRAMUSCULAR

## 2016-12-08 MED ORDER — BACLOFEN 5 MG PO TABS: 5.0000 mg | ORAL_TABLET | Freq: Three times a day (TID) | ORAL | 0 refills | Status: DC

## 2016-12-08 NOTE — Patient Instructions (Signed)
Rib Contusion A rib contusion is a deep bruise on your rib area. Contusions are the result of a blunt trauma that causes bleeding and injury to the tissues under the skin. A rib contusion may involve bruising of the ribs and of the skin and muscles in the area. The skin overlying the contusion may turn blue, purple, or yellow. Minor injuries will give you a painless contusion, but more severe contusions may stay painful and swollen for a few weeks. What are the causes? A contusion is usually caused by a blow, trauma, or direct force to an area of the body. This often occurs while playing contact sports. What are the signs or symptoms?  Swelling and redness of the injured area.  Discoloration of the injured area.  Tenderness and soreness of the injured area.  Pain with or without movement. How is this diagnosed? The diagnosis can be made by taking a medical history and performing a physical exam. An X-ray, CT scan, or MRI may be needed to determine if there were any associated injuries, such as broken bones (fractures) or internal injuries. How is this treated? Often, the best treatment for a rib contusion is rest. Icing or applying cold compresses to the injured area may help reduce swelling and inflammation. Deep breathing exercises may be recommended to reduce the risk of partial lung collapse and pneumonia. Over-the-counter or prescription medicines may also be recommended for pain control. Follow these instructions at home:  Apply ice to the injured area: ? Put ice in a plastic bag. ? Place a towel between your skin and the bag. ? Leave the ice on for 20 minutes, 2-3 times per day.  Take medicines only as directed by your health care provider.  Rest the injured area. Avoid strenuous activity and any activities or movements that cause pain. Be careful during activities and avoid bumping the injured area.  Perform deep-breathing exercises as directed by your health care provider.  Do  not lift anything that is heavier than 5 lb (2.3 kg) until your health care provider approves.  Do not use any tobacco products, including cigarettes, chewing tobacco, or electronic cigarettes. If you need help quitting, ask your health care provider. Contact a health care provider if:  You have increased bruising or swelling.  You have pain that is not controlled with treatment.  You have a fever. Get help right away if:  You have difficulty breathing or shortness of breath.  You develop a continual cough, or you cough up thick or bloody sputum.  You feel sick to your stomach (nauseous), you throw up (vomit), or you have abdominal pain. This information is not intended to replace advice given to you by your health care provider. Make sure you discuss any questions you have with your health care provider. Document Released: 02/24/2001 Document Revised: 11/07/2015 Document Reviewed: 03/13/2014 Elsevier Interactive Patient Education  2018 [employer]  

## 2016-12-08 NOTE — Progress Notes (Signed)
Subjective:     Patient ID: Allison Mcintyre, female   DOB: November 18, 1960, 56 y.o.   MRN: [id number]  Fall  The accident occurred 12 to 24 hours ago. Fall occurred: she fell in her backyard while walking up the stairs. Impact surface: Stairs. Point of impact: She toppled over after tripping on something and fell on her right side. She felt she lost consciousness after she fell for few min. Pain location: Right rib cage hurt. The pain is at a severity of 8/10. The pain is moderate. The symptoms are aggravated by extension, movement and standing. Pertinent negatives include no abdominal pain, fever, headaches, hearing loss, nausea, numbness, tingling, visual change or vomiting. Associated symptoms comments: Reduces her breathing due to pain and the back of her neck hurt. Treatments tried: Muscle relaxant was given to her by her sister. Ibuprofen 600 mg. The treatment provided no relief.  Leg swelling: Also C/O B/L ankle swelling. She had used HCTZ in the past but discontinued it due to low BP. Denies ankle pain. Uses small salt in her diet. No other concern.  Current Outpatient Prescriptions on File Prior to Visit  Medication Sig Dispense Refill  . Diclofenac Sodium 1 % CREA Place 1 application onto the skin 3 (three) times daily as needed (joint pain). (Patient not taking: Reported on 12/08/2016) 120 g 3  . hydrochlorothiazide (MICROZIDE) 12.5 MG capsule Take 1 capsule (12.5 mg total) by mouth daily as needed. (Patient not taking: Reported on 12/08/2016) 30 capsule 1  . ibuprofen (ADVIL,MOTRIN) 600 MG tablet Take 1 tablet (600 mg total) by mouth every 8 (eight) hours as needed for moderate pain. (Patient not taking: Reported on 12/08/2016) 30 tablet 0   No current facility-administered medications on file prior to visit.    Past Medical History:  Diagnosis Date  . Menorrhagia       Review of Systems  Constitutional: Negative for fever.  Respiratory: Negative.   Gastrointestinal: Negative for  abdominal pain, nausea and vomiting.  Musculoskeletal:       Right rib pain  Neurological: Negative for tingling, numbness and headaches.  All other systems reviewed and are negative.      Objective:   Physical Exam  Constitutional: She is oriented to person, place, and time. She appears well-developed. No distress.  Cardiovascular: Normal rate, regular rhythm and normal heart sounds.   No murmur heard. Pulmonary/Chest: Effort normal. No respiratory distress. She has no wheezes. She exhibits no deformity.    Abdominal: Soft. Bowel sounds are normal. She exhibits no distension and no mass. There is no tenderness.  Musculoskeletal: She exhibits edema.  Neurological: She is alert and oriented to person, place, and time. She has normal reflexes. No cranial nerve deficit. Coordination normal.  Nursing note and vitals reviewed.   Dg Ribs Unilateral W/chest Right  Result Date: 12/08/2016 CLINICAL DATA:  Fall yesterday with rib pain, initial encounter EXAM: RIGHT RIBS AND CHEST - 3+ VIEW COMPARISON:  07/26/2008 FINDINGS: Cardiac shadow is within normal limits. The lungs are well-aerated without focal infiltrate or sizable effusion. No pneumothorax is noted. No rib fractures are noted. IMPRESSION: No acute abnormality noted. Electronically Signed   By: Inez Catalina M.D.   On: 12/08/2016 11:50        Assessment:     Fall with right side rib pain B/L ankle edema: Chronic    Plan:      1. Mechanical fall with brief LOC per patient.     She did not go  to the ED.     No neurologic deficit.     Xray neg for rib fracture.     Toradol shot given today.     Tramadol prescribed with flexeril prn for a short period.     Return precaution discussed.  2. Conservative measures recommended for ankle edema.     F/U soon with PCP for further management.     She verbalized understanding.

## 2016-12-08 NOTE — Telephone Encounter (Signed)
Message left to call back.   Please let her know that her xray result is completely normal without fracture.

## 2017-06-22 ENCOUNTER — Other Ambulatory Visit: Payer: Self-pay

## 2017-06-22 ENCOUNTER — Ambulatory Visit: Payer: BLUE CROSS/BLUE SHIELD | Admitting: Family Medicine

## 2017-06-22 ENCOUNTER — Encounter: Payer: Self-pay | Admitting: Family Medicine

## 2017-06-22 VITALS — BP 150/78 | HR 74 | Temp 98.2°F | Ht 65.0 in | Wt 235.4 lb

## 2017-06-22 DIAGNOSIS — B349 Viral infection, unspecified: Secondary | ICD-10-CM | POA: Diagnosis not present

## 2017-06-22 DIAGNOSIS — Z23 Encounter for immunization: Secondary | ICD-10-CM | POA: Diagnosis not present

## 2017-06-22 NOTE — Patient Instructions (Signed)
  For the congestion - I think you have a viral infection perhaps the flu or perhaps due to the ammonia.  Take ibuprofen - Motrin 400 mg three times a day   If you are not better in 10 days or have a high fever then see a doctor  Consider stop smoking   Followup with your new doctor for the lump on your leg and for your blood pressure.  Bring the blood pressure readings into your new doctor

## 2017-06-22 NOTE — Progress Notes (Signed)
Subjective  Patient is presenting with the following illnesses  URI  Major symptoms: congested, runny stuffy nose Has been sick for 3 days. Progression: slightly better today Medications tried: none Sick contacts: no but was using ammonia cleaner before this started Patient believes may be caused by maybe ammonia   Symptoms Fever: no Headache or face pain: no Tooth pain: no Sneezing: mild Scratchy throat: mld Allergies: no Muscle aches: moderate all over Severe fatigue: no Stiff neck: no Shortness of breath: no Rash: no Sore throat or swollen glands: no   ROS see HPI Smoking Status noted - daily  Chief Complaint noted Review of Symptoms - see HPI PMH - Smoking status noted.     Objective Vital Signs reviewed NAD Lungs:  Normal respiratory effort, chest expands symmetrically. Lungs are clear to auscultation, no crackles or wheezes. Heart - Regular rate and rhythm.  No murmurs, gallops or rubs.    Skin:  Intact without suspicious lesions or rashes Neck:  No deformities, thyromegaly, masses, or tenderness noted.   Supple with full range of motion without pain. Throat: normal mucosa, no exudate, uvula midline, no redness     Assessments/Plans  URI - most consistent with a virus.  Outside of window for flu therapy.   Suggested smoking cessation.  Will be changing practices to one nearer her home   See after visit summary for details of patient instuctions

## 2017-07-07 ENCOUNTER — Ambulatory Visit (INDEPENDENT_AMBULATORY_CARE_PROVIDER_SITE_OTHER): Payer: BLUE CROSS/BLUE SHIELD | Admitting: Physician Assistant

## 2017-07-07 ENCOUNTER — Encounter (INDEPENDENT_AMBULATORY_CARE_PROVIDER_SITE_OTHER): Payer: Self-pay | Admitting: Physician Assistant

## 2017-07-07 ENCOUNTER — Other Ambulatory Visit: Payer: Self-pay

## 2017-07-07 VITALS — BP 156/76 | HR 67 | Temp 97.9°F | Ht 66.14 in | Wt 239.6 lb

## 2017-07-07 DIAGNOSIS — R2241 Localized swelling, mass and lump, right lower limb: Secondary | ICD-10-CM | POA: Diagnosis not present

## 2017-07-07 DIAGNOSIS — Z9119 Patient's noncompliance with other medical treatment and regimen: Secondary | ICD-10-CM | POA: Diagnosis not present

## 2017-07-07 DIAGNOSIS — I10 Essential (primary) hypertension: Secondary | ICD-10-CM | POA: Diagnosis not present

## 2017-07-07 DIAGNOSIS — Z91199 Patient's noncompliance with other medical treatment and regimen due to unspecified reason: Secondary | ICD-10-CM

## 2017-07-07 MED ORDER — AMLODIPINE BESYLATE 5 MG PO TABS: 5.0000 mg | ORAL_TABLET | Freq: Every day | ORAL | 1 refills | Status: DC

## 2017-07-07 MED ORDER — HYDROCHLOROTHIAZIDE 25 MG PO TABS: 25.0000 mg | ORAL_TABLET | Freq: Every day | ORAL | 1 refills | Status: DC

## 2017-07-07 NOTE — Progress Notes (Signed)
Subjective:  Patient ID: Allison Mcintyre, female    DOB: 1961-04-21  Age: 57 y.o. MRN: [id number]  CC: knot of back of thigh  HPI Allison Mcintyre is a 57 y.o. female with a medical history of menorrhagia presents with "knot of back of thigh". She was previously assessed for the same "knot" on 09/06/2015 by Dr. Sherril Cong. Felt to most likely be a fibrous tissue or fatty inclusion in muscle. Previous US  imaging on 12/12/2013 revealed probable lipoma with recommendation for MRI if mass enlarges. Patient says the mass is enlarged and is causing discomfort. Located just distal to  the right gluteal crease. Says the mass began behind the right knee a few years ago but has traveled upward. Does not endorse night sweats, weight loss, general malaise, fever, chills, nausea, vomiting, or any other symptom.    BP noted to be elevated today in clinic and is similar to last office visit at [facility] She reports not taking her HCTZ.       Outpatient Medications Prior to Visit  Medication Sig Dispense Refill  . baclofen 5 MG TABS Take 5 mg by mouth 3 (three) times daily. (Patient not taking: Reported on 07/07/2017) 15 tablet 0  . Diclofenac Sodium 1 % CREA Place 1 application onto the skin 3 (three) times daily as needed (joint pain). (Patient not taking: Reported on 12/08/2016) 120 g 3  . hydrochlorothiazide (MICROZIDE) 12.5 MG capsule Take 1 capsule (12.5 mg total) by mouth daily as needed. (Patient not taking: Reported on 12/08/2016) 30 capsule 1  . ibuprofen (ADVIL,MOTRIN) 600 MG tablet Take 1 tablet (600 mg total) by mouth every 8 (eight) hours as needed for moderate pain. (Patient not taking: Reported on 12/08/2016) 30 tablet 0  . traMADol (ULTRAM) 50 MG tablet Take 1 tablet (50 mg total) by mouth every 6 (six) hours as needed. (Patient not taking: Reported on 07/07/2017) 20 tablet 0   No facility-administered medications prior to visit.      ROS Review of Systems  Constitutional:  Negative for chills, fever and malaise/fatigue.  Eyes: Negative for blurred vision.  Respiratory: Negative for shortness of breath.   Cardiovascular: Negative for chest pain and palpitations.  Gastrointestinal: Negative for abdominal pain and nausea.  Genitourinary: Negative for dysuria and hematuria.  Musculoskeletal: Negative for joint pain and myalgias.  Skin: Negative for rash.       Mass in back of right leg  Neurological: Negative for tingling and headaches.  Psychiatric/Behavioral: Negative for depression. The patient is not nervous/anxious.     Objective:  BP (!) 156/76 (BP Location: Right Arm, Patient Position: Sitting, Cuff Size: Large)   Pulse 67   Temp 97.9 F (36.6 C) (Oral)   Ht 5' 6.14" (1.68 m)   Wt 239 lb 9.6 oz (108.7 kg)   SpO2 98%   BMI 38.51 kg/m   BP/Weight 07/07/2017 06/22/2017 6/96/7893  Systolic BP 810 175 102  Diastolic BP 76 78 66  Wt. (Lbs) 239.6 235.4 239  BMI 38.51 39.17 39.77      Physical Exam  Constitutional: She is oriented to person, place, and time.  Well developed, overweight, NAD, not well versed or particularly pleasant  HENT:  Head: Normocephalic and atraumatic.  Eyes: No scleral icterus.  Neck: Normal range of motion.  Cardiovascular: Normal rate, regular rhythm and normal heart sounds.  Pulmonary/Chest: Effort normal and breath sounds normal.  Musculoskeletal: She exhibits no edema.  Neurological: She is alert and oriented to  person, place, and time. No cranial nerve deficit. Coordination normal.  Normal gait  Skin: Skin is warm and dry. No rash noted. No erythema. No pallor.  Approximately 5 cm freely movable mass just distal to the right gluteal crease  Psychiatric: She has a normal mood and affect. Her behavior is normal. Thought content normal.  Vitals reviewed.    Assessment & Plan:    1. Mass of right thigh - Radiology was called to obtain the correct order number. MRI pelvis with and without contrast ordered.   2.  Hypertension, unspecified type - Begin amLODipine (NORVASC) 5 MG tablet; Take 1 tablet (5 mg total) by mouth daily.  Dispense: 90 tablet; Refill: 1 - Begin hydrochlorothiazide (HYDRODIURIL) 25 MG tablet; Take 1 tablet (25 mg total) by mouth daily. Take on tablet in the morning.  Dispense: 90 tablet; Refill: 1  3. Noncompliance - Advised to take anti-hypertensives as directed to help avoid complications such as MI and stroke. Pt voiced understanding and said she will take meds as directed.  Meds ordered this encounter  Medications  . amLODipine (NORVASC) 5 MG tablet    Sig: Take 1 tablet (5 mg total) by mouth daily.    Dispense:  90 tablet    Refill:  1    Order Specific Question:   Supervising Provider    Answer:   Tresa Garter [id number]  . hydrochlorothiazide (HYDRODIURIL) 25 MG tablet    Sig: Take 1 tablet (25 mg total) by mouth daily. Take on tablet in the morning.    Dispense:  90 tablet    Refill:  1    Order Specific Question:   Supervising Provider    Answer:   Tresa Garter [[id number]]    Follow-up: 6 weeks for HTN  Clent Demark PA

## 2017-07-07 NOTE — Patient Instructions (Signed)

## 2017-07-14 ENCOUNTER — Ambulatory Visit (HOSPITAL_COMMUNITY): Payer: BLUE CROSS/BLUE SHIELD

## 2017-07-20 ENCOUNTER — Telehealth: Payer: Self-pay | Admitting: Physician Assistant

## 2017-07-23 ENCOUNTER — Ambulatory Visit (HOSPITAL_COMMUNITY): Payer: BLUE CROSS/BLUE SHIELD

## 2017-08-04 ENCOUNTER — Ambulatory Visit (INDEPENDENT_AMBULATORY_CARE_PROVIDER_SITE_OTHER): Payer: BLUE CROSS/BLUE SHIELD | Admitting: Physician Assistant

## 2017-08-06 ENCOUNTER — Encounter (HOSPITAL_COMMUNITY): Payer: Self-pay | Admitting: Emergency Medicine

## 2017-08-06 ENCOUNTER — Emergency Department (HOSPITAL_COMMUNITY): Payer: BLUE CROSS/BLUE SHIELD

## 2017-08-06 ENCOUNTER — Emergency Department (HOSPITAL_COMMUNITY)
Admission: EM | Admit: 2017-08-06 | Discharge: 2017-08-06 | Disposition: A | Discharge status: Home / Self Care | Payer: BLUE CROSS/BLUE SHIELD | Attending: Emergency Medicine | Admitting: Emergency Medicine

## 2017-08-06 DIAGNOSIS — I10 Essential (primary) hypertension: Secondary | ICD-10-CM | POA: Diagnosis not present

## 2017-08-06 DIAGNOSIS — F17210 Nicotine dependence, cigarettes, uncomplicated: Secondary | ICD-10-CM | POA: Diagnosis not present

## 2017-08-06 DIAGNOSIS — R202 Paresthesia of skin: Secondary | ICD-10-CM | POA: Diagnosis present

## 2017-08-06 DIAGNOSIS — G540 Brachial plexus disorders: Secondary | ICD-10-CM | POA: Diagnosis not present

## 2017-08-06 DIAGNOSIS — Z79899 Other long term (current) drug therapy: Secondary | ICD-10-CM | POA: Insufficient documentation

## 2017-08-06 DIAGNOSIS — M62838 Other muscle spasm: Secondary | ICD-10-CM

## 2017-08-06 HISTORY — DX: Essential (primary) hypertension: I10

## 2017-08-06 LAB — CBC
HEMATOCRIT: 36.5 % (ref 36.0–46.0)
Hemoglobin: 12 g/dL (ref 12.0–15.0)
MCH: 31.5 pg (ref 26.0–34.0)
MCHC: 32.9 g/dL (ref 30.0–36.0)
MCV: 95.8 fL (ref 78.0–100.0)
PLATELETS: 346 10*3/uL (ref 150–400)
RBC: 3.81 MIL/uL — ABNORMAL LOW (ref 3.87–5.11)
RDW: 12.9 % (ref 11.5–15.5)
WBC: 8.4 10*3/uL (ref 4.0–10.5)

## 2017-08-06 LAB — APTT: aPTT: 27 seconds (ref 24–36)

## 2017-08-06 LAB — COMPREHENSIVE METABOLIC PANEL
ALT: 19 U/L (ref 14–54)
ANION GAP: 12 (ref 5–15)
AST: 18 U/L (ref 15–41)
Albumin: 4 g/dL (ref 3.5–5.0)
Alkaline Phosphatase: 76 U/L (ref 38–126)
BUN: 19 mg/dL (ref 6–20)
CHLORIDE: 104 mmol/L (ref 101–111)
CO2: 27 mmol/L (ref 22–32)
CREATININE: 0.77 mg/dL (ref 0.44–1.00)
Calcium: 9.6 mg/dL (ref 8.9–10.3)
Glucose, Bld: 103 mg/dL — ABNORMAL HIGH (ref 65–99)
POTASSIUM: 3.7 mmol/L (ref 3.5–5.1)
SODIUM: 143 mmol/L (ref 135–145)
Total Bilirubin: 0.3 mg/dL (ref 0.3–1.2)
Total Protein: 7.6 g/dL (ref 6.5–8.1)

## 2017-08-06 LAB — I-STAT CHEM 8, ED
BUN: 19 mg/dL (ref 6–20)
Calcium, Ion: 1.18 mmol/L (ref 1.15–1.40)
Chloride: 104 mmol/L (ref 101–111)
Creatinine, Ser: 0.7 mg/dL (ref 0.44–1.00)
GLUCOSE: 98 mg/dL (ref 65–99)
HEMATOCRIT: 37 % (ref 36.0–46.0)
HEMOGLOBIN: 12.6 g/dL (ref 12.0–15.0)
POTASSIUM: 3.6 mmol/L (ref 3.5–5.1)
SODIUM: 142 mmol/L (ref 135–145)
TCO2: 29 mmol/L (ref 22–32)

## 2017-08-06 LAB — DIFFERENTIAL
BASOS PCT: 0 %
Basophils Absolute: 0 10*3/uL (ref 0.0–0.1)
EOS ABS: 0.4 10*3/uL (ref 0.0–0.7)
EOS PCT: 4 %
Lymphocytes Relative: 38 %
Lymphs Abs: 3.2 10*3/uL (ref 0.7–4.0)
MONO ABS: 0.5 10*3/uL (ref 0.1–1.0)
MONOS PCT: 6 %
Neutro Abs: 4.4 10*3/uL (ref 1.7–7.7)
Neutrophils Relative %: 52 %

## 2017-08-06 LAB — I-STAT TROPONIN, ED: TROPONIN I, POC: 0 ng/mL (ref 0.00–0.08)

## 2017-08-06 LAB — PROTIME-INR
INR: 1.02
PROTHROMBIN TIME: 13.3 s (ref 11.4–15.2)

## 2017-08-06 NOTE — ED Notes (Addendum)
Pt refuses discharge vital signs. Pt states that her sister is waiting out In the lobby and needs to go. Right AC 22 ga IV was d/c'd. Unable to complete discharge instruction completely, as this writer was "talking too slow." Pt requests a sandwich at time of departure. When asked about pain, pt responds "I don't know, I just wanna go."

## 2017-08-06 NOTE — ED Triage Notes (Signed)
Patient states she has been on BP medications for past 3 weeks. Starting yesterday noticed some tingling in her right arm and left hand that has been constant. Also having minor pain in back of neck. Equal and strong ilateral grip strength. ambulatory with steady gait. No neuro deficits noted in triage. Pt adds her right arm gave out on her while working earlier today.

## 2017-08-06 NOTE — ED Provider Notes (Signed)
[place] DEPT Provider Note  CSN: [id number] Arrival date & time: 08/06/17 1658  Chief Complaint(s) Tingling  HPI Allison Mcintyre is a 57 y.o. female with a history of hypertension who presents to the emergency department with 1 week of right shoulder pain with associated paresthesias down the right arm.  Patient reports that she has had similar episodes in the past that have resolved on their own.  This has been intermittent paresthesias to the right upper extremity for the past week however it has been constant since this morning.  She denies any trauma.  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  She denies any chest pain or shortness of breath.  She denies any focal weakness, headache, visual disturbance.  She denies any other physical complaints.  HPI  Past Medical History Past Medical History:  Diagnosis Date  . Hypertension   . Menorrhagia    Patient Active Problem List   Diagnosis Date Noted  . Mild peripheral edema 11/19/2016  . Other fatigue 11/19/2016  . Daytime somnolence 11/19/2016  . Anemia 04/03/2015  . Palpitations 10/04/2014  . Venous (peripheral) insufficiency 10/04/2014  . Mass of right thigh 12/11/2013  . Elevated blood pressure 12/11/2013   Home Medication(s) Prior to Admission medications   Medication Sig Start Date End Date Taking? Authorizing Provider  amLODipine (NORVASC) 5 MG tablet Take 1 tablet (5 mg total) by mouth daily. 07/07/17  Yes Clent Demark, PA-C  guaifenesin (MUCUS RELIEF) 400 MG TABS tablet Take 400 mg by mouth every 4 (four) hours as needed (cough).   Yes [provider]  hydrochlorothiazide (HYDRODIURIL) 25 MG tablet Take 1 tablet (25 mg total) by mouth daily. Take on tablet in the morning. 07/07/17  Yes Clent Demark, PA-C                                                                                                                                    Past  Surgical History Past Surgical History:  Procedure Laterality Date  . ABDOMINAL HYSTERECTOMY  2003  . CESAREAN SECTION     Family History Family History  Problem Relation Age of Onset  . Cancer Mother   . Bronchitis Father   . Colon polyps Neg Hx     Social History Social History   Tobacco Use  . Smoking status: Current Every Day Smoker    Packs/day: 0.25    Types: Cigarettes  . Smokeless tobacco: Never Used  Substance Use Topics  . Alcohol use: Yes    Alcohol/week: 1.8 oz    Types: 3 Cans of beer per week    Comment: occasional , about weekly  . Drug use: No   Allergies Patient has no known allergies.  Review of Systems Review of Systems All other systems are reviewed and are negative for acute change except as noted in the HPI  Physical  Exam Vital Signs  I have reviewed the triage vital signs BP (!) 156/90 (BP Location: Right Arm)   Pulse 72   Temp 98.2 F (36.8 C) (Oral)   Resp 18   SpO2 97%   Physical Exam  Constitutional: She is oriented to person, place, and time. She appears well-developed and well-nourished. No distress.  HENT:  Head: Normocephalic and atraumatic.  Nose: Nose normal.  Eyes: Conjunctivae and EOM are normal. Pupils are equal, round, and reactive to light. Right eye exhibits no discharge. Left eye exhibits no discharge. No scleral icterus.  Neck: Normal range of motion. Neck supple.  Cardiovascular: Normal rate and regular rhythm. Exam reveals no gallop and no friction rub.  No murmur heard. Pulmonary/Chest: Effort normal and breath sounds normal. No stridor. No respiratory distress. She has no rales.  Abdominal: Soft. She exhibits no distension. There is no tenderness.  Musculoskeletal: She exhibits no edema.       Cervical back: She exhibits tenderness and spasm.       Back:  Neurological: She is alert and oriented to person, place, and time.  Mental Status:  Alert and oriented to person, place, and time.  Attention and  concentration normal.  Speech clear.  Recent memory is intact  Cranial Nerves:  II Visual Fields: Intact to confrontation. Visual fields intact. III, IV, VI: Pupils equal and reactive to light and near. Full eye movement without nystagmus  V Facial Sensation: Normal. No weakness of masticatory muscles  VII: No facial weakness or asymmetry  VIII Auditory Acuity: Grossly normal  IX/X: The uvula is midline; the palate elevates symmetrically  XI: Normal sternocleidomastoid and trapezius strength  XII: The tongue is midline. No atrophy or fasciculations.   Motor System: Muscle Strength: 5/5 and symmetric in the upper and lower extremities. No pronation or drift.  Muscle Tone: Tone and muscle bulk are normal in the upper and lower extremities.   Reflexes: DTRs: 1+ and symmetrical in all four extremities. No Clonus Coordination: Intact finger-to-nose. No tremor.  Sensation: Intact to light touch, and pinprick.  Gait: Routine gait normal.   Skin: Skin is warm and dry. No rash noted. She is not diaphoretic. No erythema.  Psychiatric: She has a normal mood and affect.  Vitals reviewed.   ED Results and Treatments Labs (all labs ordered are listed, but only abnormal results are displayed) Labs Reviewed  CBC - Abnormal; Notable for the following components:      Result Value   RBC 3.81 (*)    All other components within normal limits  COMPREHENSIVE METABOLIC PANEL - Abnormal; Notable for the following components:   Glucose, Bld 103 (*)    All other components within normal limits  PROTIME-INR  APTT  DIFFERENTIAL  I-STAT TROPONIN, ED  I-STAT CHEM 8, ED  CBG MONITORING, ED  EKG  EKG Interpretation  Date/Time:  Friday August 06 2017 17:18:03 EST Ventricular Rate:  78 PR Interval:    QRS Duration: 105 QT Interval:  383 QTC Calculation: 437 R Axis:   68 Text  Interpretation:  Sinus rhythm since last tracing no significant change Confirmed by Malvin Johns [phone]) on 08/06/2017 5:38:56 PM      Radiology Ct Head Wo Contrast  Result Date: 08/06/2017 CLINICAL DATA:  Right arm and left hand tingling x2 days. EXAM: CT HEAD WITHOUT CONTRAST TECHNIQUE: Contiguous axial images were obtained from the base of the skull through the vertex without intravenous contrast. COMPARISON:  None. FINDINGS: BRAIN: The ventricles and sulci are normal. No intraparenchymal hemorrhage, mass effect nor midline shift. No acute large vascular territory infarcts. Chronic appearing small vessel ischemic disease of periventricular white matter. Probable small lacunar infarct along the anterior limb of the right internal capsule. The basal ganglia are unremarkable. No abnormal extra-axial fluid collections. Basal cisterns are not effaced and midline. The brainstem and cerebellar hemispheres are without acute abnormalities. VASCULAR: Unremarkable. SKULL/SOFT TISSUES: No skull fracture. No significant soft tissue swelling. ORBITS/SINUSES: The mastoid air cells are clear. Moderate ethmoid sinus mucosal thickening. Intact orbits and globes. Clear mastoids. OTHER: Small left supraorbital soft tissue ossifications likely related to old remote trauma. IMPRESSION: Chronic appearing small vessel ischemic disease of periventricular white matter. Probable remote small lacunar infarct along the anterior limb of the internal capsule. No acute intracranial abnormality. Electronically Signed   By: Ashley Royalty M.D.   On: 08/06/2017 18:02   Pertinent labs & imaging results that were available during my care of the patient were reviewed by me and considered in my medical decision making (see chart for details).  Medications Ordered in ED Medications - No data to display                                                                                                                                   Procedures Procedures  (including critical care time)  Medical Decision Making / ED Course I have reviewed the nursing notes for this encounter and the patient's prior records (if available in EHR or on provided paperwork).    Presentation is consistent with right parascapular and upper back muscle spasm resulting in thoracic outlet syndrome.  No focal deficits noted on exam concerning for CVA.  Recommended symptomatic management.  The patient appears reasonably screened and/or stabilized for discharge and I doubt any other medical condition or other [facility] requiring further screening, evaluation, or treatment in the ED at this time prior to discharge.  The patient is safe for discharge with strict return precautions.   Final Clinical Impression(s) / ED Diagnoses Final diagnoses:  Muscle spasm of right shoulder  Thoracic outlet syndrome    Disposition: Discharge  Condition: Good  I have discussed the results, Dx and Tx plan with the patient who expressed understanding and agree(s) with the  plan. Discharge instructions discussed at great length. The patient was given strict return precautions who verbalized understanding of the instructions. No further questions at time of discharge.    ED Discharge Orders    None       Follow Up: Clent Demark, [place] [address] [phone]   in 1-2 weeks, If symptoms do not improve or  worsen     This chart was dictated using voice recognition software.  Despite best efforts to proofread,  errors can occur which can change the documentation meaning.   Fatima Blank, MD 08/06/17 2101

## 2017-08-06 NOTE — Discharge Instructions (Signed)
You may use over-the-counter Motrin (Ibuprofen), Acetaminophen (Tylenol), topical muscle creams such as SalonPas, Icy Hot, Bengay, etc. Please stretch, apply heat, and have massage therapy for additional assistance. ° °

## 2017-08-11 ENCOUNTER — Ambulatory Visit (INDEPENDENT_AMBULATORY_CARE_PROVIDER_SITE_OTHER): Payer: BLUE CROSS/BLUE SHIELD | Admitting: Physician Assistant

## 2017-08-17 ENCOUNTER — Ambulatory Visit (INDEPENDENT_AMBULATORY_CARE_PROVIDER_SITE_OTHER): Payer: BLUE CROSS/BLUE SHIELD | Admitting: Physician Assistant

## 2017-08-19 ENCOUNTER — Ambulatory Visit (INDEPENDENT_AMBULATORY_CARE_PROVIDER_SITE_OTHER): Payer: BLUE CROSS/BLUE SHIELD | Admitting: Physician Assistant

## 2017-09-22 ENCOUNTER — Ambulatory Visit (INDEPENDENT_AMBULATORY_CARE_PROVIDER_SITE_OTHER): Payer: BLUE CROSS/BLUE SHIELD | Admitting: Physician Assistant

## 2017-10-13 ENCOUNTER — Ambulatory Visit (INDEPENDENT_AMBULATORY_CARE_PROVIDER_SITE_OTHER): Payer: BLUE CROSS/BLUE SHIELD | Admitting: Physician Assistant

## 2017-10-19 ENCOUNTER — Ambulatory Visit (INDEPENDENT_AMBULATORY_CARE_PROVIDER_SITE_OTHER): Payer: BLUE CROSS/BLUE SHIELD | Admitting: Physician Assistant

## 2017-10-21 ENCOUNTER — Ambulatory Visit (INDEPENDENT_AMBULATORY_CARE_PROVIDER_SITE_OTHER): Payer: BLUE CROSS/BLUE SHIELD | Admitting: Nurse Practitioner

## 2017-10-25 ENCOUNTER — Encounter (HOSPITAL_COMMUNITY): Payer: Self-pay | Admitting: Family Medicine

## 2017-10-25 ENCOUNTER — Ambulatory Visit (HOSPITAL_COMMUNITY)
Admission: EM | Admit: 2017-10-25 | Discharge: 2017-10-25 | Disposition: A | Discharge status: Home / Self Care | Payer: BC Managed Care – PPO | Attending: Family Medicine | Admitting: Family Medicine

## 2017-10-25 ENCOUNTER — Ambulatory Visit (INDEPENDENT_AMBULATORY_CARE_PROVIDER_SITE_OTHER): Payer: BC Managed Care – PPO

## 2017-10-25 DIAGNOSIS — J209 Acute bronchitis, unspecified: Secondary | ICD-10-CM

## 2017-10-25 DIAGNOSIS — R071 Chest pain on breathing: Secondary | ICD-10-CM

## 2017-10-25 DIAGNOSIS — R0789 Other chest pain: Secondary | ICD-10-CM

## 2017-10-25 MED ORDER — ALBUTEROL SULFATE HFA 108 (90 BASE) MCG/ACT IN AERS: 1.0000 | INHALATION_SPRAY | Freq: Four times a day (QID) | RESPIRATORY_TRACT | 0 refills | Status: DC | PRN

## 2017-10-25 MED ORDER — ACETAMINOPHEN 500 MG PO TABS: 500.0000 mg | ORAL_TABLET | Freq: Four times a day (QID) | ORAL | 0 refills | Status: DC | PRN

## 2017-10-25 MED ORDER — PREDNISONE 20 MG PO TABS: 40.0000 mg | ORAL_TABLET | Freq: Every day | ORAL | 0 refills | Status: AC

## 2017-10-25 NOTE — ED Triage Notes (Signed)
Pt here for cough and upper back pain with breathing x 8 days.

## 2017-10-25 NOTE — ED Provider Notes (Signed)
[name]    CSN: [id number] Arrival date & time: 10/25/17  1241     History   Chief Complaint Chief Complaint  Patient presents with  . Cough  . Back Pain    HPI Allison Mcintyre is a 57 y.o. female.   [name] presents with complaints of cough for the past 7-8 days which causes bilateral mid back/rib pain with cough, r>L side. States does have some right sided rib pain with movement as well. Denies fevers. No ear pain, sore throat, congestion. Minimal shortness of breath. Smokes approximately 8 cigarettes a day. No history of asthma. No recent travel or use of hormone therapy. States she did fall approximately 3 months ago and bruised her right ribs. Has an appointment with her PCP 5/30. Hx htn, peripheral edema.    ROS per HPI.      Past Medical History:  Diagnosis Date  . Hypertension   . Menorrhagia     Patient Active Problem List   Diagnosis Date Noted  . Mild peripheral edema 11/19/2016  . Other fatigue 11/19/2016  . Daytime somnolence 11/19/2016  . Anemia 04/03/2015  . Palpitations 10/04/2014  . Venous (peripheral) insufficiency 10/04/2014  . Mass of right thigh 12/11/2013  . Elevated blood pressure 12/11/2013    Past Surgical History:  Procedure Laterality Date  . ABDOMINAL HYSTERECTOMY  2003  . CESAREAN SECTION      OB History    Gravida  4   Para  1   Term  1   Preterm      AB  3   Living        SAB      TAB  3   Ectopic      Multiple      Live Births               Home Medications    Prior to Admission medications   Medication Sig Start Date End Date Taking? Authorizing Provider  acetaminophen (TYLENOL) 500 MG tablet Take 1 tablet (500 mg total) by mouth every 6 (six) hours as needed. 10/25/17   Zigmund Gottron, NP  albuterol (PROVENTIL HFA;VENTOLIN HFA) 108 (90 Base) MCG/ACT inhaler Inhale 1-2 puffs into the lungs every 6 (six) hours as needed for wheezing or shortness of breath. 10/25/17   Zigmund Gottron, NP  amLODipine (NORVASC) 5 MG tablet Take 1 tablet (5 mg total) by mouth daily. 07/07/17   Clent Demark, PA-C  guaifenesin (MUCUS RELIEF) 400 MG TABS tablet Take 400 mg by mouth every 4 (four) hours as needed (cough).    [provider]  hydrochlorothiazide (HYDRODIURIL) 25 MG tablet Take 1 tablet (25 mg total) by mouth daily. Take on tablet in the morning. 07/07/17   Clent Demark, PA-C  predniSONE (DELTASONE) 20 MG tablet Take 2 tablets (40 mg total) by mouth daily with breakfast for 5 days. 10/25/17 10/30/17  Zigmund Gottron, NP    Family History Family History  Problem Relation Age of Onset  . Cancer Mother   . Bronchitis Father   . Colon polyps Neg Hx     Social History Social History   Tobacco Use  . Smoking status: Current Every Day Smoker    Packs/day: 0.25    Types: Cigarettes  . Smokeless tobacco: Never Used  Substance Use Topics  . Alcohol use: Yes    Alcohol/week: 1.8 oz    Types: 3 Cans of beer per week  Comment: occasional , about weekly  . Drug use: No     Allergies   Patient has no known allergies.   Review of Systems Review of Systems   Physical Exam Triage Vital Signs ED Triage Vitals  Enc Vitals Group     BP 10/25/17 1350 (!) 135/95     Pulse Rate 10/25/17 1350 (!) 56     Resp 10/25/17 1350 18     Temp 10/25/17 1350 98.6 F (37 C)     Temp src --      SpO2 10/25/17 1350 100 %     Weight --      Height --      Head Circumference --      Peak Flow --      Pain Score 10/25/17 1349 8     Pain Loc --      Pain Edu? --      Excl. in [place]? --    No data found.  Updated Vital Signs BP (!) 135/95   Pulse (!) 56   Temp 98.6 F (37 C)   Resp 18   SpO2 100%   Physical Exam  Constitutional: She is oriented to person, place, and time. She appears well-developed and well-nourished. No distress.  HENT:  Head: Normocephalic and atraumatic.  Right Ear: Tympanic membrane, external ear and ear canal normal.  Left  Ear: Tympanic membrane, external ear and ear canal normal.  Nose: Nose normal.  Mouth/Throat: Uvula is midline, oropharynx is clear and moist and mucous membranes are normal. No tonsillar exudate.  Eyes: Pupils are equal, round, and reactive to light. Conjunctivae and EOM are normal.  Cardiovascular: Normal rate, regular rhythm and normal heart sounds.  Pulmonary/Chest: Effort normal and breath sounds normal. She exhibits tenderness. She exhibits no crepitus, no edema, no deformity, no swelling and no retraction.    Neurological: She is alert and oriented to person, place, and time.  Skin: Skin is warm and dry.     UC Treatments / Results  Labs (all labs ordered are listed, but only abnormal results are displayed) Labs Reviewed - No data to display  EKG None  Radiology Dg Chest 2 View  Result Date: 10/25/2017 CLINICAL DATA:  Cough and right-sided chest pain with deep breathing for the past week. EXAM: CHEST - 2 VIEW COMPARISON:  Chest x-ray dated December 08, 2016. FINDINGS: The heart size and mediastinal contours are within normal limits. Both lungs are clear. The visualized skeletal structures are unremarkable. IMPRESSION: No active cardiopulmonary disease. Electronically Signed   By: Titus Dubin M.D.   On: 10/25/2017 14:27    Procedures Procedures (including critical care time)  Medications Ordered in UC Medications - No data to display  Initial Impression / Assessment and Plan / UC Course  I have reviewed the triage vital signs and the nursing notes.  Pertinent labs & imaging results that were available during my care of the patient were reviewed by me and considered in my medical decision making (see chart for details).     Benign physical findings. Mild tenderness on palpation to right lateral ribs. Without cough throughout exam. Non toxic in appearance. Afebrile. Without tachycardia, tachypnea, hypoxia or increased work of breathing. Costochondritis and bronchitis  discussed with patient. Will treat with 5 days of prednisone, use of inhaler as needed. Return precautions provided. If symptoms worsen or do not improve in the next week to return to be seen or to follow up with PCP.  Patient verbalized understanding  and agreeable to plan.    Final Clinical Impressions(s) / UC Diagnoses   Final diagnoses:  Acute bronchitis, unspecified organism  Costochondral chest pain     Discharge Instructions     Push fluids to ensure adequate hydration and keep secretions thin.  Tylenol and/or ibuprofen as needed for pain. Albuterol as needed for wheezing, cough or shortness of breath. 5 days of prednisone. If develop increased cough, shortness of breath, chest pain, fevers, or otherwise worsening please return or go to Er.  If symptoms persist please follow up with your primary care provider.      ED Prescriptions    Medication Sig Dispense Auth. Provider   predniSONE (DELTASONE) 20 MG tablet Take 2 tablets (40 mg total) by mouth daily with breakfast for 5 days. 10 tablet Augusto Gamble B, NP   albuterol (PROVENTIL HFA;VENTOLIN HFA) 108 (90 Base) MCG/ACT inhaler Inhale 1-2 puffs into the lungs every 6 (six) hours as needed for wheezing or shortness of breath. 1 Inhaler Augusto Gamble B, NP   acetaminophen (TYLENOL) 500 MG tablet Take 1 tablet (500 mg total) by mouth every 6 (six) hours as needed. 30 tablet Zigmund Gottron, NP     Controlled Substance Prescriptions [place] Controlled Substance Registry consulted? Not Applicable   Zigmund Gottron, NP 10/25/17 1439

## 2017-10-25 NOTE — Discharge Instructions (Addendum)
Push fluids to ensure adequate hydration and keep secretions thin.  Tylenol and/or ibuprofen as needed for pain. Albuterol as needed for wheezing, cough or shortness of breath. 5 days of prednisone. If develop increased cough, shortness of breath, chest pain, fevers, or otherwise worsening please return or go to Er.  If symptoms persist please follow up with your primary care provider.

## 2017-11-11 ENCOUNTER — Ambulatory Visit (INDEPENDENT_AMBULATORY_CARE_PROVIDER_SITE_OTHER): Payer: BLUE CROSS/BLUE SHIELD | Admitting: Nurse Practitioner

## 2017-11-17 ENCOUNTER — Other Ambulatory Visit: Payer: Self-pay

## 2017-11-17 ENCOUNTER — Ambulatory Visit (INDEPENDENT_AMBULATORY_CARE_PROVIDER_SITE_OTHER): Payer: BC Managed Care – PPO | Admitting: Physician Assistant

## 2017-11-17 ENCOUNTER — Encounter (INDEPENDENT_AMBULATORY_CARE_PROVIDER_SITE_OTHER): Payer: Self-pay | Admitting: Physician Assistant

## 2017-11-17 VITALS — BP 152/76 | HR 69 | Temp 98.5°F | Ht 65.5 in | Wt 240.6 lb

## 2017-11-17 DIAGNOSIS — I10 Essential (primary) hypertension: Secondary | ICD-10-CM

## 2017-11-17 DIAGNOSIS — Z23 Encounter for immunization: Secondary | ICD-10-CM | POA: Diagnosis not present

## 2017-11-17 DIAGNOSIS — Z760 Encounter for issue of repeat prescription: Secondary | ICD-10-CM

## 2017-11-17 DIAGNOSIS — R2241 Localized swelling, mass and lump, right lower limb: Secondary | ICD-10-CM | POA: Diagnosis not present

## 2017-11-17 MED ORDER — HYDROCHLOROTHIAZIDE 25 MG PO TABS: 25.0000 mg | ORAL_TABLET | Freq: Every day | ORAL | 5 refills | Status: DC

## 2017-11-17 MED ORDER — AMLODIPINE BESYLATE 5 MG PO TABS: 5.0000 mg | ORAL_TABLET | Freq: Every day | ORAL | 5 refills | Status: DC

## 2017-11-17 MED ORDER — ALBUTEROL SULFATE HFA 108 (90 BASE) MCG/ACT IN AERS: 1.0000 | INHALATION_SPRAY | Freq: Four times a day (QID) | RESPIRATORY_TRACT | 0 refills | Status: DC | PRN

## 2017-11-17 NOTE — Patient Instructions (Signed)
Td Vaccine (Tetanus and Diphtheria): What You Need to Know 1. Why get vaccinated? Tetanus  and diphtheria are very serious diseases. They are rare in the [place] today, but people who do become infected often have severe complications. Td vaccine is used to protect adolescents and adults from both of these diseases. Both tetanus and diphtheria are infections caused by bacteria. Diphtheria spreads from person to person through coughing or sneezing. Tetanus-causing bacteria enter the body through cuts, scratches, or wounds. TETANUS (lockjaw) causes painful muscle tightening and stiffness, usually all over the body.  It can lead to tightening of muscles in the head and neck so you can't open your mouth, swallow, or sometimes even breathe. Tetanus kills about 1 out of every 10 people who are infected even after receiving the best medical care.  DIPHTHERIA can cause a thick coating to form in the back of the throat.  It can lead to breathing problems, paralysis, heart failure, and death.  Before vaccines, as many as 200,000 cases of diphtheria and hundreds of cases of tetanus were reported in the [place] each year. Since vaccination began, reports of cases for both diseases have dropped by about 99%. 2. Td vaccine Td vaccine can protect adolescents and adults from tetanus and diphtheria. Td is usually given as a booster dose every 10 years but it can also be given earlier after a severe and dirty wound or burn. Another vaccine, called Tdap, which protects against pertussis in addition to tetanus and diphtheria, is sometimes recommended instead of Td vaccine. Your doctor or the person giving you the vaccine can give you more information. Td may safely be given at the same time as other vaccines. 3. Some people should not get this vaccine  A person who has ever had a life-threatening allergic reaction after a previous dose of any tetanus or diphtheria containing vaccine, OR has a severe  allergy to any part of this vaccine, should not get Td vaccine. Tell the person giving the vaccine about any severe allergies.  Talk to your doctor if you: ? had severe pain or swelling after any vaccine containing diphtheria or tetanus, ? ever had a condition called Guillain Barre Syndrome (GBS), ? aren't feeling well on the day the shot is scheduled. 4. What are the risks from Td vaccine? With any medicine, including vaccines, there is a chance of side effects. These are usually mild and go away on their own. Serious reactions are also possible but are rare. Most people who get Td vaccine do not have any problems with it. Mild problems following Td vaccine: (Did not interfere with activities)  Pain where the shot was given (about 8 people in 10)  Redness or swelling where the shot was given (about 1 person in 4)  Mild fever (rare)  Headache (about 1 person in 4)  Tiredness (about 1 person in 4)  Moderate problems following Td vaccine: (Interfered with activities, but did not require medical attention)  Fever over 102F (rare)  Severe problems following Td vaccine: (Unable to perform usual activities; required medical attention)  Swelling, severe pain, bleeding and/or redness in the arm where the shot was given (rare).  Problems that could happen after any vaccine:  People sometimes faint after a medical procedure, including vaccination. Sitting or lying down for about 15 minutes can help prevent fainting, and injuries caused by a fall. Tell your doctor if you feel dizzy, or have vision changes or ringing in the ears.  Some people get   severe pain in the shoulder and have difficulty moving the arm where a shot was given. This happens very rarely.  Any medication can cause a severe allergic reaction. Such reactions from a vaccine are very rare, estimated at fewer than 1 in a million doses, and would happen within a few minutes to a few hours after the vaccination. As with any  medicine, there is a very remote chance of a vaccine causing a serious injury or death. The safety of vaccines is always being monitored. For more information, visit: www.cdc.gov/vaccinesafety/ 5. What if there is a serious reaction? What should I look for? Look for anything that concerns you, such as signs of a severe allergic reaction, very high fever, or unusual behavior. Signs of a severe allergic reaction can include hives, swelling of the face and throat, difficulty breathing, a fast heartbeat, dizziness, and weakness. These would usually start a few minutes to a few hours after the vaccination. What should I do?  If you think it is a severe allergic reaction or other emergency that can't wait, call 9-1-1 or get the person to the nearest hospital. Otherwise, call your doctor.  Afterward, the reaction should be reported to the Vaccine Adverse Event Reporting System (VAERS). Your doctor might file this report, or you can do it yourself through the VAERS web site at www.vaers.hhs.gov, or by calling 1-800-822-7967. ? VAERS does not give medical advice. 6. The National Vaccine Injury Compensation Program The National Vaccine Injury Compensation Program (VICP) is a federal program that was created to compensate people who may have been injured by certain vaccines. Persons who believe they may have been injured by a vaccine can learn about the program and about filing a claim by calling 1-800-338-2382 or visiting the VICP website at www.hrsa.gov/vaccinecompensation. There is a time limit to file a claim for compensation. 7. How can I learn more?  Ask your doctor. He or she can give you the vaccine package insert or suggest other sources of information.  Call your local or state health department.  Contact the Centers for Disease Control and Prevention (CDC): ? Call 1-800-232-4636 (1-800-CDC-INFO) ? Visit CDC's website at www.cdc.gov/vaccines CDC Td Vaccine VIS (09/24/15) This information is  not intended to replace advice given to you by your health care provider. Make sure you discuss any questions you have with your health care provider. Document Released: 03/29/2006 Document Revised: 02/20/2016 Document Reviewed: 02/20/2016 Elsevier Interactive Patient Education  2017 Elsevier Inc.  

## 2017-11-17 NOTE — Progress Notes (Signed)
Subjective:  Patient ID: Allison Mcintyre, female    DOB: [birth year]  Age: 57 y.o. MRN: [id number]  CC: HTN and mass  HPI Allison Mcintyre is a 57 y.o. female with a medical history of menorrhagia presents for f/u of mass on thigh and HTN. Mass on thigh has reportedly "moved" from leg to right gluteus. Mass does not cause pain. Previous US revealed a likely lipoma. MRI ordered per patient's request but denied by insurance.      Last BP 156/76 mmHg 5.5 months ago. Prescribed Amlodipine 5 mg and HCTZ 25 mg. Reports not taking medications due to "issues with [employer]". Does not endorse CP, palpitations, SOB, HA, tingling, numbness, weakness, PND, LE edema, presyncope, syncope, abdominal pain, f/c/n/v, or GI/GU sxs.       Outpatient Medications Prior to Visit  Medication Sig Dispense Refill  . hydrochlorothiazide (HYDRODIURIL) 25 MG tablet Take 1 tablet (25 mg total) by mouth daily. Take on tablet in the morning. 90 tablet 1  . acetaminophen (TYLENOL) 500 MG tablet Take 1 tablet (500 mg total) by mouth every 6 (six) hours as needed. (Patient not taking: Reported on 11/17/2017) 30 tablet 0  . albuterol (PROVENTIL HFA;VENTOLIN HFA) 108 (90 Base) MCG/ACT inhaler Inhale 1-2 puffs into the lungs every 6 (six) hours as needed for wheezing or shortness of breath. (Patient not taking: Reported on 11/17/2017) 1 Inhaler 0  . amLODipine (NORVASC) 5 MG tablet Take 1 tablet (5 mg total) by mouth daily. (Patient not taking: Reported on 11/17/2017) 90 tablet 1  . guaifenesin (MUCUS RELIEF) 400 MG TABS tablet Take 400 mg by mouth every 4 (four) hours as needed (cough).     No facility-administered medications prior to visit.      ROS Review of Systems  Constitutional: Negative for chills, fever and malaise/fatigue.  Eyes: Negative for blurred vision.  Respiratory: Negative for shortness of breath.   Cardiovascular: Negative for chest pain and palpitations.  Gastrointestinal: Negative for abdominal pain and  nausea.  Genitourinary: Negative for dysuria and hematuria.  Musculoskeletal: Negative for joint pain and myalgias.  Skin: Negative for rash.       Mass   Neurological: Negative for tingling and headaches.  Psychiatric/Behavioral: Negative for depression. The patient is not nervous/anxious.     Objective:  BP (!) 152/76 (BP Location: Left Arm, Patient Position: Sitting, Cuff Size: Large)   Pulse 69   Temp 98.5 F (36.9 C) (Oral)   Ht 5' 5.5" (1.664 m)   Wt 240 lb 9.6 oz (109.1 kg)   SpO2 98%   BMI 39.43 kg/m   BP/Weight 11/17/2017 10/25/2017 0/86/7619  Systolic BP 509 326 712  Diastolic BP 76 95 90  Wt. (Lbs) 240.6 - -  BMI 39.43 - -      Physical Exam  Constitutional: She is oriented to person, place, and time.  Well developed, obesity, NAD, polite  HENT:  Head: Normocephalic and atraumatic.  Eyes: No scleral icterus.  Cardiovascular: Normal rate, regular rhythm and normal heart sounds.  Pulmonary/Chest: Effort normal and breath sounds normal.  Musculoskeletal: She exhibits no edema.  Neurological: She is alert and oriented to person, place, and time.  Skin: Skin is warm and dry. No rash noted. No erythema. No pallor.  Subdermal mass on right leg just distal to the right gluteal fold.  Psychiatric: She has a normal mood and affect. Her behavior is normal. Thought content normal.  Vitals reviewed.    Assessment & Plan:  1. Mass of right thigh - Ambulatory referral to General Surgery  2. Hypertension, unspecified type - Pt advised to go to [facility] as an affordable choice for her anti-hypertensives which are on the $4 list for a month's supply.  - amLODipine (NORVASC) 5 MG tablet; Take 1 tablet (5 mg total) by mouth daily.  Dispense: 30 tablet; Refill: 5 - hydrochlorothiazide (HYDRODIURIL) 25 MG tablet; Take 1 tablet (25 mg total) by mouth daily. Take on tablet in the morning.  Dispense: 30 tablet; Refill: 5  3. Medication refill - albuterol (PROVENTIL  HFA;VENTOLIN HFA) 108 (90 Base) MCG/ACT inhaler; Inhale 1-2 puffs into the lungs every 6 (six) hours as needed for wheezing or shortness of breath.  Dispense: 1 Inhaler; Refill: 0  4. Need for Tdap vaccination - Tdap vaccine greater than or equal to 7yo IM   Meds ordered this encounter  Medications  . amLODipine (NORVASC) 5 MG tablet    Sig: Take 1 tablet (5 mg total) by mouth daily.    Dispense:  30 tablet    Refill:  5    Order Specific Question:   Supervising Provider    Answer:   Charlott Rakes [[id number]]  . hydrochlorothiazide (HYDRODIURIL) 25 MG tablet    Sig: Take 1 tablet (25 mg total) by mouth daily. Take on tablet in the morning.    Dispense:  30 tablet    Refill:  5    Order Specific Question:   Supervising Provider    Answer:   Charlott Rakes [[id number]]  . albuterol (PROVENTIL HFA;VENTOLIN HFA) 108 (90 Base) MCG/ACT inhaler    Sig: Inhale 1-2 puffs into the lungs every 6 (six) hours as needed for wheezing or shortness of breath.    Dispense:  1 Inhaler    Refill:  0    Order Specific Question:   Supervising Provider    Answer:   Charlott Rakes [[id number]]    Follow-up: Return in about 1 month (around 12/15/2017) for HTN.   Clent Demark PA

## 2018-01-05 ENCOUNTER — Ambulatory Visit (INDEPENDENT_AMBULATORY_CARE_PROVIDER_SITE_OTHER): Payer: BC Managed Care – PPO | Admitting: Physician Assistant

## 2018-02-01 ENCOUNTER — Ambulatory Visit (INDEPENDENT_AMBULATORY_CARE_PROVIDER_SITE_OTHER): Payer: BC Managed Care – PPO | Admitting: Physician Assistant

## 2018-02-22 ENCOUNTER — Encounter (INDEPENDENT_AMBULATORY_CARE_PROVIDER_SITE_OTHER): Payer: Self-pay | Admitting: Physician Assistant

## 2018-02-22 ENCOUNTER — Ambulatory Visit (INDEPENDENT_AMBULATORY_CARE_PROVIDER_SITE_OTHER): Payer: BC Managed Care – PPO | Admitting: Physician Assistant

## 2018-02-22 ENCOUNTER — Other Ambulatory Visit: Payer: Self-pay

## 2018-02-22 VITALS — BP 133/76 | HR 76 | Temp 98.4°F | Ht 65.5 in | Wt 239.0 lb

## 2018-02-22 DIAGNOSIS — K649 Unspecified hemorrhoids: Secondary | ICD-10-CM | POA: Diagnosis not present

## 2018-02-22 DIAGNOSIS — Z1211 Encounter for screening for malignant neoplasm of colon: Secondary | ICD-10-CM

## 2018-02-22 DIAGNOSIS — R5383 Other fatigue: Secondary | ICD-10-CM

## 2018-02-22 DIAGNOSIS — Z91199 Patient's noncompliance with other medical treatment and regimen due to unspecified reason: Secondary | ICD-10-CM

## 2018-02-22 DIAGNOSIS — Z1239 Encounter for other screening for malignant neoplasm of breast: Secondary | ICD-10-CM

## 2018-02-22 DIAGNOSIS — I10 Essential (primary) hypertension: Secondary | ICD-10-CM | POA: Diagnosis not present

## 2018-02-22 DIAGNOSIS — Z1231 Encounter for screening mammogram for malignant neoplasm of breast: Secondary | ICD-10-CM

## 2018-02-22 DIAGNOSIS — Z9119 Patient's noncompliance with other medical treatment and regimen: Secondary | ICD-10-CM

## 2018-02-22 DIAGNOSIS — Z23 Encounter for immunization: Secondary | ICD-10-CM

## 2018-02-22 MED ORDER — HYDROCORTISONE 2.5 % RE CREA: 1.0000 "application " | TOPICAL_CREAM | Freq: Two times a day (BID) | RECTAL | 0 refills | Status: DC

## 2018-02-22 NOTE — Progress Notes (Signed)
Pt complains of blood in stool and hemorrhoids Pt complains of sleeping too much

## 2018-02-22 NOTE — Progress Notes (Signed)
Subjective:  Patient ID: Allison Mcintyre, female    DOB: 1961/02/18  Age: 57 y.o. MRN: [id number]  CC: f/u HTN  HPI  Allison V Humphreyis a 57 y.o.femalewith a medical history of menorrhagia presents to f/u on HTN. Last BP 152/76 mmHg three months ago. Had been found to be non-compliant due to "issues with [employer]". Says she is not taking any of her anti-hypertensive medications because she ran out four days ago. BP 133/76 mmHg today.     Has been feeling fatigued for approximately 2-3 months. Says she has to take a long nap at work, sometimes sleeps five hours at work. States she sleeps well at night without awakening. Has some bleeding with bowel movements attributed to hemorrhoids. Does not endorse any other symptoms or complaints.        Outpatient Medications Prior to Visit  Medication Sig Dispense Refill  . amLODipine (NORVASC) 5 MG tablet Take 1 tablet (5 mg total) by mouth daily. 30 tablet 5  . hydrochlorothiazide (HYDRODIURIL) 25 MG tablet Take 1 tablet (25 mg total) by mouth daily. Take on tablet in the morning. 30 tablet 5  . albuterol (PROVENTIL HFA;VENTOLIN HFA) 108 (90 Base) MCG/ACT inhaler Inhale 1-2 puffs into the lungs every 6 (six) hours as needed for wheezing or shortness of breath. (Patient not taking: Reported on 02/22/2018) 1 Inhaler 0   No facility-administered medications prior to visit.      ROS Review of Systems  Constitutional: Negative for chills, fever and malaise/fatigue.  Eyes: Negative for blurred vision.  Respiratory: Negative for shortness of breath.   Cardiovascular: Negative for chest pain and palpitations.  Gastrointestinal: Positive for blood in stool. Negative for abdominal pain and nausea.  Genitourinary: Negative for dysuria and hematuria.  Musculoskeletal: Negative for joint pain and myalgias.  Skin: Negative for rash.  Neurological: Negative for tingling and headaches.  Psychiatric/Behavioral: Negative for depression. The patient is not  nervous/anxious.     Objective:  BP 133/76 (BP Location: Left Arm, Patient Position: Sitting, Cuff Size: Large)   Pulse 76   Temp 98.4 F (36.9 C) (Oral)   Ht 5' 5.5" (1.664 m)   Wt 239 lb (108.4 kg)   SpO2 97%   BMI 39.17 kg/m   BP/Weight 02/22/2018 11/17/2017 06/23/3233  Systolic BP 573 220 254  Diastolic BP 76 76 95  Wt. (Lbs) 239 240.6 -  BMI 39.17 39.43 -      Physical Exam  Constitutional: She is oriented to person, place, and time.  Well developed, well nourished, NAD, polite  HENT:  Head: Normocephalic and atraumatic.  Eyes: No scleral icterus.  Neck: Normal range of motion. Neck supple. No thyromegaly present.  Cardiovascular: Normal rate, regular rhythm and normal heart sounds.  Pulmonary/Chest: Effort normal and breath sounds normal.  Abdominal: Soft. Bowel sounds are normal. There is no tenderness.  Genitourinary:  Genitourinary Comments: Anal tag  Musculoskeletal: She exhibits no edema.  Neurological: She is alert and oriented to person, place, and time.  Skin: Skin is warm and dry. No rash noted. No erythema. No pallor.  Psychiatric: She has a normal mood and affect. Her behavior is normal. Thought content normal.  Vitals reviewed.    Assessment & Plan:    1. Hypertension, unspecified type - CBC with Differential - TSH - Comprehensive metabolic panel - Lipid panel  2. Fatigue, unspecified type - CBC with Differential - TSH  3. Hemorrhoids, unspecified hemorrhoid type - Begin Anusol rectal cream 2.5% rectal cream  4. Special screening for malignant neoplasms, colon - Fecal occult blood, imunochemical  5. Screening for breast cancer - MM Digital Screening; Future  6. Noncompliance - Pt strongly advised to keep track of her medications and obtain refills in a timely manner.  7. Need for prophylactic vaccination and inoculation against influenza - Flu Vaccine QUAD 6+ mos PF IM (Fluarix Quad PF)     Follow-up: Return in about 4 weeks  (around 03/22/2018) for HTN.   Clent Demark PA

## 2018-02-22 NOTE — Patient Instructions (Signed)

## 2018-02-23 ENCOUNTER — Telehealth (INDEPENDENT_AMBULATORY_CARE_PROVIDER_SITE_OTHER): Payer: Self-pay

## 2018-02-23 ENCOUNTER — Other Ambulatory Visit (INDEPENDENT_AMBULATORY_CARE_PROVIDER_SITE_OTHER): Payer: Self-pay | Admitting: Physician Assistant

## 2018-02-23 DIAGNOSIS — E781 Pure hyperglyceridemia: Secondary | ICD-10-CM

## 2018-02-23 LAB — TSH: TSH: 1.16 u[IU]/mL (ref 0.450–4.500)

## 2018-02-23 LAB — CBC WITH DIFFERENTIAL/PLATELET
BASOS: 0 %
Basophils Absolute: 0 10*3/uL (ref 0.0–0.2)
EOS (ABSOLUTE): 0.4 10*3/uL (ref 0.0–0.4)
Eos: 4 %
HEMATOCRIT: 35.1 % (ref 34.0–46.6)
HEMOGLOBIN: 11.5 g/dL (ref 11.1–15.9)
IMMATURE GRANS (ABS): 0 10*3/uL (ref 0.0–0.1)
Immature Granulocytes: 0 %
LYMPHS: 33 %
Lymphocytes Absolute: 2.7 10*3/uL (ref 0.7–3.1)
MCH: 31.7 pg (ref 26.6–33.0)
MCHC: 32.8 g/dL (ref 31.5–35.7)
MCV: 97 fL (ref 79–97)
Monocytes Absolute: 0.6 10*3/uL (ref 0.1–0.9)
Monocytes: 8 %
NEUTROS ABS: 4.5 10*3/uL (ref 1.4–7.0)
Neutrophils: 55 %
PLATELETS: 367 10*3/uL (ref 150–450)
RBC: 3.63 x10E6/uL — ABNORMAL LOW (ref 3.77–5.28)
RDW: 13.2 % (ref 12.3–15.4)
WBC: 8.2 10*3/uL (ref 3.4–10.8)

## 2018-02-23 LAB — LIPID PANEL
CHOLESTEROL TOTAL: 173 mg/dL (ref 100–199)
Chol/HDL Ratio: 3.1 ratio (ref 0.0–4.4)
HDL: 55 mg/dL (ref >39)
LDL CALC: 78 mg/dL (ref 0–99)
TRIGLYCERIDES: 202 mg/dL — AB (ref 0–149)
VLDL Cholesterol Cal: 40 mg/dL (ref 5–40)

## 2018-02-23 LAB — COMPREHENSIVE METABOLIC PANEL
ALK PHOS: 75 IU/L (ref 39–117)
ALT: 19 IU/L (ref 0–32)
AST: 16 IU/L (ref 0–40)
Albumin/Globulin Ratio: 1.5 (ref 1.2–2.2)
Albumin: 4 g/dL (ref 3.5–5.5)
BUN/Creatinine Ratio: 20 (ref 9–23)
BUN: 18 mg/dL (ref 6–24)
Bilirubin Total: <0.2 mg/dL (ref 0.0–1.2)
CALCIUM: 9 mg/dL (ref 8.7–10.2)
CO2: 23 mmol/L (ref 20–29)
Chloride: 108 mmol/L — ABNORMAL HIGH (ref 96–106)
Creatinine, Ser: 0.88 mg/dL (ref 0.57–1.00)
GFR calc Af Amer: 84 mL/min/{1.73_m2} (ref >59)
GFR, EST NON AFRICAN AMERICAN: 73 mL/min/{1.73_m2} (ref >59)
GLOBULIN, TOTAL: 2.6 g/dL (ref 1.5–4.5)
Glucose: 92 mg/dL (ref 65–99)
POTASSIUM: 4.4 mmol/L (ref 3.5–5.2)
Sodium: 145 mmol/L — ABNORMAL HIGH (ref 134–144)
Total Protein: 6.6 g/dL (ref 6.0–8.5)

## 2018-02-23 MED ORDER — OMEGA-3-ACID ETHYL ESTERS 1 G PO CAPS: 2.0000 g | ORAL_CAPSULE | Freq: Two times a day (BID) | ORAL | 1 refills | Status: DC

## 2018-02-23 MED ORDER — PRAVASTATIN SODIUM 20 MG PO TABS: 20.0000 mg | ORAL_TABLET | Freq: Every day | ORAL | 1 refills | Status: DC

## 2018-02-23 NOTE — Telephone Encounter (Signed)
-----   Message from Clent Demark, PA-C sent at 02/23/2018  1:10 PM EDT ----- Triglycerides elevated. Please decrease sweet and fatty food intake. I have sent Lovaza and Pravastatin to [facility] at [facility]. Rest of labs normal/unremarkable.

## 2018-02-23 NOTE — Telephone Encounter (Signed)
Patient Is aware that triglycerides are elevated; lovaza and pravastatin sent to [facility] on [address]. Advised to decrease sweet and fatty food intake. Nat Christen, CMA

## 2018-03-02 ENCOUNTER — Telehealth: Payer: Self-pay

## 2018-03-02 NOTE — Telephone Encounter (Signed)
PA for Lovaza was denied by the insurance company because her Triglyceride level was under 500 mg/dL. Please advise the pt on how to move forward.

## 2018-03-02 NOTE — Telephone Encounter (Signed)
That is too bad. Please let her know to buy OTC Maximum strength fish oil pills. Bottle should state ultrafiltration for mercury.

## 2018-03-03 ENCOUNTER — Telehealth (INDEPENDENT_AMBULATORY_CARE_PROVIDER_SITE_OTHER): Payer: Self-pay | Admitting: Physician Assistant

## 2018-03-03 NOTE — Telephone Encounter (Signed)
Patient boyfriend is not listed on DPR. When/if patient returns call she be provided with results.Nat Christen, CMA

## 2018-03-03 NOTE — Telephone Encounter (Signed)
Patient boyfriend Driscilla Grammes return call in regard of lab work.  Stated that he would advice Ms. Moger to give a call to the clinic .  Allison Mcintyre

## 2018-03-03 NOTE — Telephone Encounter (Signed)
Left message informing patient that her insurance denied to pay for the [place]. Per PCP purchase OTC maximum strength fish oil pills and be sure the bottle states ultrafiltration for mercury. Call [facility] with any questions or concerns. Nat Christen, CMA

## 2018-03-03 NOTE — Telephone Encounter (Signed)
Noted. Allison Mcintyre

## 2018-03-22 ENCOUNTER — Ambulatory Visit (INDEPENDENT_AMBULATORY_CARE_PROVIDER_SITE_OTHER): Payer: BC Managed Care – PPO | Admitting: Physician Assistant

## 2018-04-20 ENCOUNTER — Ambulatory Visit (INDEPENDENT_AMBULATORY_CARE_PROVIDER_SITE_OTHER): Payer: BC Managed Care – PPO | Admitting: Physician Assistant

## 2018-04-29 ENCOUNTER — Ambulatory Visit (INDEPENDENT_AMBULATORY_CARE_PROVIDER_SITE_OTHER): Payer: BC Managed Care – PPO | Admitting: Physician Assistant

## 2018-05-24 ENCOUNTER — Ambulatory Visit (INDEPENDENT_AMBULATORY_CARE_PROVIDER_SITE_OTHER): Payer: BC Managed Care – PPO | Admitting: Physician Assistant

## 2018-06-27 ENCOUNTER — Ambulatory Visit (INDEPENDENT_AMBULATORY_CARE_PROVIDER_SITE_OTHER): Payer: BC Managed Care – PPO | Admitting: Internal Medicine

## 2018-08-12 ENCOUNTER — Telehealth (INDEPENDENT_AMBULATORY_CARE_PROVIDER_SITE_OTHER): Payer: Self-pay | Admitting: Physician Assistant

## 2018-08-12 NOTE — Telephone Encounter (Signed)
1) Medication(s) Requested (by name): BP (patient states she doesn't know the name) Advised patient of OV scheduled says they have not had pills for over 2 weeks and would like to get a refill  2) Pharmacy of Choice:   [facility] on [address].   Please follow up

## 2018-08-17 ENCOUNTER — Encounter (INDEPENDENT_AMBULATORY_CARE_PROVIDER_SITE_OTHER): Payer: Self-pay | Admitting: Primary Care

## 2018-08-17 ENCOUNTER — Ambulatory Visit (INDEPENDENT_AMBULATORY_CARE_PROVIDER_SITE_OTHER): Payer: BC Managed Care – PPO | Admitting: Primary Care

## 2018-08-17 ENCOUNTER — Other Ambulatory Visit: Payer: Self-pay

## 2018-08-17 VITALS — BP 159/78 | HR 77 | Temp 98.0°F | Ht 65.5 in | Wt 236.6 lb

## 2018-08-17 DIAGNOSIS — F17210 Nicotine dependence, cigarettes, uncomplicated: Secondary | ICD-10-CM | POA: Diagnosis not present

## 2018-08-17 DIAGNOSIS — R2241 Localized swelling, mass and lump, right lower limb: Secondary | ICD-10-CM

## 2018-08-17 DIAGNOSIS — D509 Iron deficiency anemia, unspecified: Secondary | ICD-10-CM | POA: Diagnosis not present

## 2018-08-17 DIAGNOSIS — I10 Essential (primary) hypertension: Secondary | ICD-10-CM | POA: Diagnosis not present

## 2018-08-17 DIAGNOSIS — F17200 Nicotine dependence, unspecified, uncomplicated: Secondary | ICD-10-CM

## 2018-08-17 DIAGNOSIS — Z760 Encounter for issue of repeat prescription: Secondary | ICD-10-CM

## 2018-08-17 MED ORDER — HYDROCHLOROTHIAZIDE 25 MG PO TABS: 25.0000 mg | ORAL_TABLET | Freq: Every day | ORAL | 5 refills | Status: DC

## 2018-08-17 MED ORDER — AMLODIPINE BESYLATE 5 MG PO TABS: 5.0000 mg | ORAL_TABLET | Freq: Every day | ORAL | 5 refills | Status: DC

## 2018-08-17 MED ORDER — OMEGA-3-ACID ETHYL ESTERS 1 G PO CAPS: 2.0000 g | ORAL_CAPSULE | Freq: Two times a day (BID) | ORAL | 1 refills | Status: DC

## 2018-08-17 MED ORDER — IBUPROFEN 600 MG PO TABS: 600.0000 mg | ORAL_TABLET | Freq: Three times a day (TID) | ORAL | 1 refills | Status: DC | PRN

## 2018-08-17 MED ORDER — PRAVASTATIN SODIUM 20 MG PO TABS: 20.0000 mg | ORAL_TABLET | Freq: Every day | ORAL | 1 refills | Status: DC

## 2018-08-17 MED ORDER — ALBUTEROL SULFATE HFA 108 (90 BASE) MCG/ACT IN AERS: 1.0000 | INHALATION_SPRAY | Freq: Four times a day (QID) | RESPIRATORY_TRACT | 0 refills | Status: DC | PRN

## 2018-08-17 NOTE — Addendum Note (Signed)
Addended by: Kerin Perna on: 08/17/2018 03:48 PM   Modules accepted: Orders

## 2018-08-17 NOTE — Progress Notes (Signed)
Established Patient Office Visit  Subjective:  Patient ID: Allison Mcintyre, female    DOB: 11/13/1960  Age: 58 y.o. MRN: [id number]  CC:  Chief Complaint  Patient presents with  . Hypertension  . Medication Refill    HPI Allison Mcintyre presents for medication refill out of Bp meds 2 weeks. PMH of hyperlipidemia and lipoma on the back of right leg. She plans to have the lipoma surgically removed.  Past Medical History:  Diagnosis Date  . Hypertension   . Menorrhagia     Past Surgical History:  Procedure Laterality Date  . ABDOMINAL HYSTERECTOMY  2003  . CESAREAN SECTION      Family History  Problem Relation Age of Onset  . Cancer Mother   . Bronchitis Father   . Colon polyps Neg Hx     Social History   Socioeconomic History  . Marital status: Single    Spouse name: Not on file  . Number of children: Not on file  . Years of education: Not on file  . Highest education level: Not on file  Occupational History  . Not on file  Social Needs  . Financial resource strain: Not on file  . Food insecurity:    Worry: Not on file    Inability: Not on file  . Transportation needs:    Medical: Not on file    Non-medical: Not on file  Tobacco Use  . Smoking status: Current Every Day Smoker    Packs/day: 0.25    Types: Cigarettes  . Smokeless tobacco: Never Used  Substance and Sexual Activity  . Alcohol use: Yes    Alcohol/week: 3.0 standard drinks    Types: 3 Cans of beer per week    Comment: occasional , about weekly  . Drug use: No  . Sexual activity: Yes    Birth control/protection: Surgical  Lifestyle  . Physical activity:    Days per week: Not on file    Minutes per session: Not on file  . Stress: Not on file  Relationships  . Social connections:    Talks on phone: Not on file    Gets together: Not on file    Attends religious service: Not on file    Active member of club or organization: Not on file    Attends meetings of clubs or  organizations: Not on file    Relationship status: Not on file  . Intimate partner violence:    Fear of current or ex partner: Not on file    Emotionally abused: Not on file    Physically abused: Not on file    Forced sexual activity: Not on file  Other Topics Concern  . Not on file  Social History Narrative  . Not on file    Outpatient Medications Prior to Visit  Medication Sig Dispense Refill  . omega-3 acid ethyl esters (LOVAZA) 1 g capsule Take 2 capsules (2 g total) by mouth 2 (two) times daily. 360 capsule 1  . albuterol (PROVENTIL HFA;VENTOLIN HFA) 108 (90 Base) MCG/ACT inhaler Inhale 1-2 puffs into the lungs every 6 (six) hours as needed for wheezing or shortness of breath. (Patient not taking: Reported on 02/22/2018) 1 Inhaler 0  . amLODipine (NORVASC) 5 MG tablet Take 1 tablet (5 mg total) by mouth daily. (Patient not taking: Reported on 08/17/2018) 30 tablet 5  . hydrochlorothiazide (HYDRODIURIL) 25 MG tablet Take 1 tablet (25 mg total) by mouth daily. Take on tablet in the morning. (Patient  not taking: Reported on 08/17/2018) 30 tablet 5  . pravastatin (PRAVACHOL) 20 MG tablet Take 1 tablet (20 mg total) by mouth daily. (Patient not taking: Reported on 08/17/2018) 90 tablet 1  . hydrocortisone (ANUSOL-HC) 2.5 % rectal cream Place 1 application rectally 2 (two) times daily. 30 g 0   No facility-administered medications prior to visit.     No Known Allergies  ROS Review of Systems  Constitutional: Negative.   HENT: Positive for congestion.   Eyes: Negative.   Respiratory: Positive for cough.   Cardiovascular: Negative.   Gastrointestinal: Negative.   Endocrine: Negative.   Genitourinary: Negative.   Musculoskeletal: Positive for back pain.  Skin: Negative.   Allergic/Immunologic: Negative.   Neurological: Positive for headaches.  Hematological: Negative.   Psychiatric/Behavioral: Negative.       Objective:    Physical Exam  Constitutional: She is oriented to  person, place, and time. She appears well-developed.  HENT:  Head: Normocephalic.  Eyes: Pupils are equal, round, and reactive to light.  Neck: Thyromegaly present.  Cardiovascular: Normal rate and regular rhythm.  Pulmonary/Chest: Effort normal.  Abdominal: Soft. Bowel sounds are normal. She exhibits distension.  Musculoskeletal: Normal range of motion.  Neurological: She is alert and oriented to person, place, and time.  Skin: Skin is warm.  Psychiatric: She has a normal mood and affect.    BP (!) 159/78 (BP Location: Left Arm, Patient Position: Sitting, Cuff Size: Normal)   Pulse 77   Temp 98 F (36.7 C) (Oral)   Ht 5' 5.5" (1.664 m)   Wt 236 lb 9.6 oz (107.3 kg)   SpO2 94%   BMI 38.77 kg/m  Wt Readings from Last 3 Encounters:  08/17/18 236 lb 9.6 oz (107.3 kg)  02/22/18 239 lb (108.4 kg)  11/17/17 240 lb 9.6 oz (109.1 kg)     Health Maintenance Due  Topic Date Due  . Fecal DNA (Cologuard)  04/16/2009  . MAMMOGRAM  08/22/2016    There are no preventive care reminders to display for this patient.  Lab Results  Component Value Date   TSH 1.160 02/22/2018   Lab Results  Component Value Date   WBC 8.2 02/22/2018   HGB 11.5 02/22/2018   HCT 35.1 02/22/2018   MCV 97 02/22/2018   PLT 367 02/22/2018   Lab Results  Component Value Date   NA 145 (H) 02/22/2018   K 4.4 02/22/2018   CO2 23 02/22/2018   GLUCOSE 92 02/22/2018   BUN 18 02/22/2018   CREATININE 0.88 02/22/2018   BILITOT <0.2 02/22/2018   ALKPHOS 75 02/22/2018   AST 16 02/22/2018   ALT 19 02/22/2018   PROT 6.6 02/22/2018   ALBUMIN 4.0 02/22/2018   CALCIUM 9.0 02/22/2018   ANIONGAP 12 08/06/2017   GFR 89.00 03/14/2015   Lab Results  Component Value Date   CHOL 173 02/22/2018   Lab Results  Component Value Date   HDL 55 02/22/2018   Lab Results  Component Value Date   LDLCALC 78 02/22/2018   Lab Results  Component Value Date   TRIG 202 (H) 02/22/2018   Lab Results  Component Value  Date   CHOLHDL 3.1 02/22/2018   No results found for: HGBA1C    Assessment & Plan:  1. Mass of right thigh Per patient is going to have mass removed   2. Hypertension, unspecified type Elevated Bp out of Bp meds for 2 weeks .During her visit eating pig skins   3. Smoker  Each visit discuss cessation  4. Anemia  Check CBC  Problem List Items Addressed This Visit    Mass of right thigh - Primary   Anemia   Relevant Orders   CBC with Differential    Other Visit Diagnoses    Hypertension, unspecified type       Smoker          Follow-up: Return in about 2 weeks (around 08/31/2018) for Bp ck .    Kerin Perna, NP

## 2018-08-18 LAB — CBC WITH DIFFERENTIAL/PLATELET
BASOS: 0 %
Basophils Absolute: 0 10*3/uL (ref 0.0–0.2)
EOS (ABSOLUTE): 0.4 10*3/uL (ref 0.0–0.4)
EOS: 5 %
HEMATOCRIT: 34.7 % (ref 34.0–46.6)
HEMOGLOBIN: 11.7 g/dL (ref 11.1–15.9)
IMMATURE GRANS (ABS): 0 10*3/uL (ref 0.0–0.1)
IMMATURE GRANULOCYTES: 0 %
LYMPHS: 33 %
Lymphocytes Absolute: 3 10*3/uL (ref 0.7–3.1)
MCH: 31.6 pg (ref 26.6–33.0)
MCHC: 33.7 g/dL (ref 31.5–35.7)
MCV: 94 fL (ref 79–97)
Monocytes Absolute: 0.6 10*3/uL (ref 0.1–0.9)
Monocytes: 7 %
NEUTROS ABS: 5 10*3/uL (ref 1.4–7.0)
Neutrophils: 55 %
Platelets: 373 10*3/uL (ref 150–450)
RBC: 3.7 x10E6/uL — ABNORMAL LOW (ref 3.77–5.28)
RDW: 12.1 % (ref 11.7–15.4)
WBC: 9.1 10*3/uL (ref 3.4–10.8)

## 2018-08-23 ENCOUNTER — Encounter (INDEPENDENT_AMBULATORY_CARE_PROVIDER_SITE_OTHER): Payer: Self-pay

## 2018-08-31 ENCOUNTER — Ambulatory Visit (INDEPENDENT_AMBULATORY_CARE_PROVIDER_SITE_OTHER): Payer: BC Managed Care – PPO

## 2018-09-02 ENCOUNTER — Encounter (INDEPENDENT_AMBULATORY_CARE_PROVIDER_SITE_OTHER): Payer: Self-pay | Admitting: Primary Care

## 2018-09-02 ENCOUNTER — Ambulatory Visit (INDEPENDENT_AMBULATORY_CARE_PROVIDER_SITE_OTHER): Payer: BC Managed Care – PPO

## 2018-09-02 ENCOUNTER — Other Ambulatory Visit: Payer: Self-pay

## 2018-09-02 VITALS — BP 159/85 | HR 91 | Temp 98.7°F

## 2018-09-02 DIAGNOSIS — Z0130 Encounter for examination of blood pressure without abnormal findings: Secondary | ICD-10-CM

## 2018-12-05 ENCOUNTER — Ambulatory Visit (INDEPENDENT_AMBULATORY_CARE_PROVIDER_SITE_OTHER): Payer: BC Managed Care – PPO | Admitting: Primary Care

## 2018-12-13 ENCOUNTER — Telehealth (INDEPENDENT_AMBULATORY_CARE_PROVIDER_SITE_OTHER): Payer: Self-pay | Admitting: Primary Care

## 2018-12-13 DIAGNOSIS — Z760 Encounter for issue of repeat prescription: Secondary | ICD-10-CM

## 2018-12-13 MED ORDER — ALBUTEROL SULFATE HFA 108 (90 BASE) MCG/ACT IN AERS: 1.0000 | INHALATION_SPRAY | Freq: Four times a day (QID) | RESPIRATORY_TRACT | 1 refills | Status: DC | PRN

## 2018-12-13 NOTE — Telephone Encounter (Signed)
Patient called to request medication refill for   albuterol (PROVENTIL HFA;VENTOLIN HFA) 108 (90 Base) MCG/ACT inhaler  Patient uses [facility]  Please advise [phone]  Thank you Emmit Pomfret

## 2018-12-20 ENCOUNTER — Encounter (INDEPENDENT_AMBULATORY_CARE_PROVIDER_SITE_OTHER): Payer: Self-pay | Admitting: Primary Care

## 2018-12-20 ENCOUNTER — Ambulatory Visit (INDEPENDENT_AMBULATORY_CARE_PROVIDER_SITE_OTHER): Payer: BC Managed Care – PPO | Admitting: Primary Care

## 2018-12-20 ENCOUNTER — Other Ambulatory Visit: Payer: Self-pay

## 2018-12-20 VITALS — BP 139/65 | HR 66 | Temp 97.7°F | Ht 65.5 in | Wt 243.6 lb

## 2018-12-20 DIAGNOSIS — K5900 Constipation, unspecified: Secondary | ICD-10-CM | POA: Diagnosis not present

## 2018-12-20 DIAGNOSIS — K649 Unspecified hemorrhoids: Secondary | ICD-10-CM

## 2018-12-20 DIAGNOSIS — I10 Essential (primary) hypertension: Secondary | ICD-10-CM

## 2018-12-20 DIAGNOSIS — E781 Pure hyperglyceridemia: Secondary | ICD-10-CM | POA: Diagnosis not present

## 2018-12-20 DIAGNOSIS — F17210 Nicotine dependence, cigarettes, uncomplicated: Secondary | ICD-10-CM

## 2018-12-20 MED ORDER — PRAVASTATIN SODIUM 20 MG PO TABS: 20.0000 mg | ORAL_TABLET | Freq: Every day | ORAL | 0 refills | Status: DC

## 2018-12-20 MED ORDER — OMEGA-3-ACID ETHYL ESTERS 1 G PO CAPS: 2.0000 g | ORAL_CAPSULE | Freq: Two times a day (BID) | ORAL | 1 refills | Status: DC

## 2018-12-20 MED ORDER — HYDROCHLOROTHIAZIDE 25 MG PO TABS: 25.0000 mg | ORAL_TABLET | Freq: Every day | ORAL | 0 refills | Status: DC

## 2018-12-20 MED ORDER — SENNA 8.6 MG PO TABS: 1.0000 | ORAL_TABLET | Freq: Every day | ORAL | 0 refills | Status: DC | PRN

## 2018-12-20 MED ORDER — HYDROCORTISONE ACETATE 25 MG RE SUPP: 25.0000 mg | Freq: Two times a day (BID) | RECTAL | 0 refills | Status: DC

## 2018-12-20 MED ORDER — AMLODIPINE BESYLATE 5 MG PO TABS: 5.0000 mg | ORAL_TABLET | Freq: Every day | ORAL | 0 refills | Status: DC

## 2018-12-20 NOTE — Progress Notes (Signed)
Established Patient Office Visit  Subjective:  Patient ID: Allison Mcintyre, female    DOB: 1961/02/05  Age: 58 y.o. MRN: [id number]  CC:  Chief Complaint  Patient presents with  . Follow-up    hypertension    HPI Allison Mcintyre presents for follow up on hypertension,  hyperlipidemia and lipoma on the back of right leg. She plans to have the lipoma surgically removed at a later time.Denies shortness of breath, headaches, chest pain or lower extremity edema  Past Medical History:  Diagnosis Date  . Hypertension   . Menorrhagia     Past Surgical History:  Procedure Laterality Date  . ABDOMINAL HYSTERECTOMY  2003  . CESAREAN SECTION      Family History  Problem Relation Age of Onset  . Cancer Mother   . Bronchitis Father   . Colon polyps Neg Hx     Social History   Socioeconomic History  . Marital status: Single    Spouse name: Not on file  . Number of children: Not on file  . Years of education: Not on file  . Highest education level: Not on file  Occupational History  . Not on file  Social Needs  . Financial resource strain: Not on file  . Food insecurity    Worry: Not on file    Inability: Not on file  . Transportation needs    Medical: Not on file    Non-medical: Not on file  Tobacco Use  . Smoking status: Current Every Day Smoker    Packs/day: 0.25    Types: Cigarettes  . Smokeless tobacco: Never Used  Substance and Sexual Activity  . Alcohol use: Yes    Alcohol/week: 3.0 standard drinks    Types: 3 Cans of beer per week    Comment: occasional , about weekly  . Drug use: No  . Sexual activity: Yes    Birth control/protection: Surgical  Lifestyle  . Physical activity    Days per week: Not on file    Minutes per session: Not on file  . Stress: Not on file  Relationships  . Social Herbalist on phone: Not on file    Gets together: Not on file    Attends religious service: Not on file    Active member of club or  organization: Not on file    Attends meetings of clubs or organizations: Not on file    Relationship status: Not on file  . Intimate partner violence    Fear of current or ex partner: Not on file    Emotionally abused: Not on file    Physically abused: Not on file    Forced sexual activity: Not on file  Other Topics Concern  . Not on file  Social History Narrative  . Not on file    Outpatient Medications Prior to Visit  Medication Sig Dispense Refill  . albuterol (VENTOLIN HFA) 108 (90 Base) MCG/ACT inhaler Inhale 1-2 puffs into the lungs every 6 (six) hours as needed for wheezing or shortness of breath. 6.7 g 1  . ibuprofen (ADVIL,MOTRIN) 600 MG tablet Take 1 tablet (600 mg total) by mouth every 8 (eight) hours as needed. 60 tablet 1  . amLODipine (NORVASC) 5 MG tablet Take 1 tablet (5 mg total) by mouth daily. 30 tablet 5  . hydrochlorothiazide (HYDRODIURIL) 25 MG tablet Take 1 tablet (25 mg total) by mouth daily. Take on tablet in the morning. 30 tablet 5  . omega-3  acid ethyl esters (LOVAZA) 1 g capsule Take 2 capsules (2 g total) by mouth 2 (two) times daily. 360 capsule 1  . pravastatin (PRAVACHOL) 20 MG tablet Take 1 tablet (20 mg total) by mouth daily. 90 tablet 1   No facility-administered medications prior to visit.     No Known Allergies  ROS Review of Systems  Constitutional: Positive for fatigue.  Psychiatric/Behavioral: Positive for behavioral problems.      Objective:    Physical Exam  Constitutional: She is oriented to person, place, and time. She appears well-developed and well-nourished.  HENT:  Head: Normocephalic.  Eyes: EOM are normal.  Neck: Normal range of motion. Neck supple.  Cardiovascular: Normal rate and regular rhythm.  Pulmonary/Chest: Effort normal and breath sounds normal.  Abdominal: Soft. Bowel sounds are normal.  Musculoskeletal: Normal range of motion.  Neurological: She is oriented to person, place, and time.  Skin: Skin is warm and  dry.  Psychiatric: She has a normal mood and affect.    BP 139/65 (BP Location: Left Arm, Patient Position: Sitting, Cuff Size: Large)   Pulse 66   Temp 97.7 F (36.5 C) (Tympanic)   Ht 5' 5.5" (1.664 m)   Wt 243 lb 9.6 oz (110.5 kg)   SpO2 96%   BMI 39.92 kg/m  Wt Readings from Last 3 Encounters:  12/20/18 243 lb 9.6 oz (110.5 kg)  08/17/18 236 lb 9.6 oz (107.3 kg)  02/22/18 239 lb (108.4 kg)     Health Maintenance Due  Topic Date Due  . Fecal DNA (Cologuard)  04/16/2009    There are no preventive care reminders to display for this patient.  Lab Results  Component Value Date   TSH 1.160 02/22/2018   Lab Results  Component Value Date   WBC 9.1 08/17/2018   HGB 11.7 08/17/2018   HCT 34.7 08/17/2018   MCV 94 08/17/2018   PLT 373 08/17/2018   Lab Results  Component Value Date   NA 145 (H) 02/22/2018   K 4.4 02/22/2018   CO2 23 02/22/2018   GLUCOSE 92 02/22/2018   BUN 18 02/22/2018   CREATININE 0.88 02/22/2018   BILITOT <0.2 02/22/2018   ALKPHOS 75 02/22/2018   AST 16 02/22/2018   ALT 19 02/22/2018   PROT 6.6 02/22/2018   ALBUMIN 4.0 02/22/2018   CALCIUM 9.0 02/22/2018   ANIONGAP 12 08/06/2017   GFR 89.00 03/14/2015   Lab Results  Component Value Date   CHOL 173 02/22/2018   Lab Results  Component Value Date   HDL 55 02/22/2018   Lab Results  Component Value Date   LDLCALC 78 02/22/2018   Lab Results  Component Value Date   TRIG 202 (H) 02/22/2018   Lab Results  Component Value Date   CHOLHDL 3.1 02/22/2018   No results found for: HGBA1C    Assessment & Plan:   Problem List Items Addressed This Visit    None    Visit Diagnoses    Hypertension, unspecified type    -  Primary   Relevant Medications   amLODipine (NORVASC) 5 MG tablet   hydrochlorothiazide (HYDRODIURIL) 25 MG tablet   pravastatin (PRAVACHOL) 20 MG tablet   omega-3 acid ethyl esters (LOVAZA) 1 g capsule   Other Relevant Orders   Lipid Panel   CBC with Differential    CMP14+EGFR   Hypertriglyceridemia       Relevant Medications   amLODipine (NORVASC) 5 MG tablet   hydrochlorothiazide (HYDRODIURIL) 25 MG tablet  pravastatin (PRAVACHOL) 20 MG tablet   omega-3 acid ethyl esters (LOVAZA) 1 g capsule   Other Relevant Orders   Lipid Panel   Hemorrhoids, unspecified hemorrhoid type       Relevant Medications   amLODipine (NORVASC) 5 MG tablet   hydrochlorothiazide (HYDRODIURIL) 25 MG tablet   pravastatin (PRAVACHOL) 20 MG tablet   omega-3 acid ethyl esters (LOVAZA) 1 g capsule   Constipation, unspecified constipation type         Allison Mcintyre was seen today for follow-up.  Diagnoses and all orders for this visit:  Hypertension, unspecified type Counseled on blood pressure goal of less than 130/80, low-sodium, DASH diet, medication compliance, 150 minutes of moderate intensity exercise per week. Discussed medication compliance, adverse effects. -     Lipid Panel; Future -     CBC with Differential; Future -     CMP14+EGFR; Future -     amLODipine (NORVASC) 5 MG tablet; Take 1 tablet (5 mg total) by mouth daily. -     hydrochlorothiazide (HYDRODIURIL) 25 MG tablet; Take 1 tablet (25 mg total) by mouth daily. Take on tablet in the morning.  Hypertriglyceridemia  Healthy lifestyle diet of fruits vegetables fish nuts whole grains and low saturated fat . Foods high in cholesterol or liver, fatty meats,cheese, butter avocados, nuts and seeds, chocolate and fried foods.  -     Lipid Panel; Future  Hemorrhoids, unspecified hemorrhoid type hydrocortisone (ANUSOL-HC) 25 MG suppository; Place 1 suppository (25 mg total) rectally 2 (two) times daily  Constipation, unspecified constipation type Causing increase rectal pain with internal hemorrhoids Prescribed stool softer and laxative  Other orders -     hydrocortisone (ANUSOL-HC) 25 MG suppository; Place 1 suppository (25 mg total) rectally 2 (two) times daily. -     senna (SENOKOT) 8.6 MG TABS tablet;  Take 1 tablet (8.6 mg total) by mouth daily as needed for mild constipation. -     pravastatin (PRAVACHOL) 20 MG tablet; Take 1 tablet (20 mg total) by mouth daily. -     omega-3 acid ethyl esters (LOVAZA) 1 g capsule; Take 2 capsules (2 g total) by mouth 2 (two) times daily.   Meds ordered this encounter  Medications  . hydrocortisone (ANUSOL-HC) 25 MG suppository    Sig: Place 1 suppository (25 mg total) rectally 2 (two) times daily.    Dispense:  12 suppository    Refill:  0  . senna (SENOKOT) 8.6 MG TABS tablet    Sig: Take 1 tablet (8.6 mg total) by mouth daily as needed for mild constipation.    Dispense:  120 tablet    Refill:  0  . amLODipine (NORVASC) 5 MG tablet    Sig: Take 1 tablet (5 mg total) by mouth daily.    Dispense:  90 tablet    Refill:  0  . hydrochlorothiazide (HYDRODIURIL) 25 MG tablet    Sig: Take 1 tablet (25 mg total) by mouth daily. Take on tablet in the morning.    Dispense:  90 tablet    Refill:  0  . pravastatin (PRAVACHOL) 20 MG tablet    Sig: Take 1 tablet (20 mg total) by mouth daily.    Dispense:  90 tablet    Refill:  0  . omega-3 acid ethyl esters (LOVAZA) 1 g capsule    Sig: Take 2 capsules (2 g total) by mouth 2 (two) times daily.    Dispense:  360 capsule    Refill:  1  Follow-up: Return in about 3 months (around 03/22/2019) for Hypertension , chol, weight.    Kerin Perna, NP

## 2018-12-20 NOTE — Patient Instructions (Addendum)
Hyperthyroidism  Hyperthyroidism is when the thyroid gland is too active (overactive). The thyroid gland is a small gland located in the lower front part of the neck, just in front of the windpipe (trachea). This gland makes hormones that help control how the body uses food for energy (metabolism) as well as how the heart and brain function. These hormones also play a role in keeping your bones strong. When the thyroid is overactive, it produces too much of a hormone called thyroxine. What are the causes? This condition may be caused by:  Graves' disease. This is a disorder in which the body's disease-fighting system (immune system) attacks the thyroid gland. This is the most common cause.  Inflammation of the thyroid gland.  A tumor in the thyroid gland.  Use of certain medicines, including: ? Prescription thyroid hormone replacement. ? Herbal supplements that mimic thyroid hormones. ? Amiodarone therapy.  Solid or fluid-filled lumps within your thyroid gland (thyroid nodules).  Taking in a large amount of iodine from foods or medicines. What increases the risk? You are more likely to develop this condition if:  You are female.  You have a family history of thyroid conditions.  You smoke tobacco.  You use a medicine called lithium.  You take medicines that affect the immune system (immunosuppressants). What are the signs or symptoms? Symptoms of this condition include:  Nervousness.  Inability to tolerate heat.  Unexplained weight loss.  Diarrhea.  Change in the texture of hair or skin.  Heart skipping beats or making extra beats.  Rapid heart rate.  Loss of menstruation.  Shaky hands.  Fatigue.  Restlessness.  Sleep problems.  Enlarged thyroid gland or a lump in the thyroid (nodule). You may also have symptoms of Graves' disease, which may include:  Protruding eyes.  Dry eyes.  Red or swollen eyes.  Problems with vision. How is this diagnosed?  This condition may be diagnosed based on:  Your symptoms and medical history.  A physical exam.  Blood tests.  Thyroid ultrasound. This test involves using sound waves to produce images of the thyroid gland.  A thyroid scan. A radioactive substance is injected into a vein, and images show how much iodine is present in the thyroid.  Radioactive iodine uptake test (RAIU). A small amount of radioactive iodine is given by mouth to see how much iodine the thyroid absorbs after a certain amount of time. How is this treated? Treatment depends on the cause and severity of the condition. Treatment may include:  Medicines to reduce the amount of thyroid hormone your body makes.  Radioactive iodine treatment (radioiodine therapy). This involves swallowing a small dose of radioactive iodine, in capsule or liquid form, to kill thyroid cells.  Surgery to remove part or all of your thyroid gland. You may need to take thyroid hormone replacement medicine for the rest of your life after thyroid surgery.  Medicines to help manage your symptoms. Follow these instructions at home:   Take over-the-counter and prescription medicines only as told by your health care provider.  Do not use any products that contain nicotine or tobacco, such as cigarettes and e-cigarettes. If you need help quitting, ask your health care provider.  Follow any instructions from your health care provider about diet. You may be instructed to limit foods that contain iodine.  Keep all follow-up visits as told by your health care provider. This is important. ? You will need to have blood tests regularly so that your health care provider can   monitor your condition. Contact a health care provider if:  Your symptoms do not get better with treatment.  You have a fever.  You are taking thyroid hormone replacement medicine and you: ? Have symptoms of depression. ? Feel like you are tired all the time. ? Gain weight. Get help  right away if:  Hypertension, Adult Hypertension is another name for high blood pressure. High blood pressure forces your heart to work harder to pump blood. This can cause problems over time. There are two numbers in a blood pressure reading. There is a top number (systolic) over a bottom number (diastolic). It is best to have a blood pressure that is below 120/80. Healthy choices can help lower your blood pressure, or you may need medicine to help lower it. What are the causes? The cause of this condition is not known. Some conditions may be related to high blood pressure. What increases the risk?  Smoking.  Having type 2 diabetes mellitus, high cholesterol, or both.  Not getting enough exercise or physical activity.  Being overweight.  Having too much fat, sugar, calories, or salt (sodium) in your diet.  Drinking too much alcohol.  Having long-term (chronic) kidney disease.  Having a family history of high blood pressure.  Age. Risk increases with age.  Race. You may be at higher risk if you are African American.  Gender. Men are at higher risk than women before age 32. After age 70, women are at higher risk than men.  Having obstructive sleep apnea.  Stress. What are the signs or symptoms?  High blood pressure may not cause symptoms. Very high blood pressure (hypertensive crisis) may cause: ? Headache. ? Feelings of worry or nervousness (anxiety). ? Shortness of breath. ? Nosebleed. ? A feeling of being sick to your stomach (nausea). ? Throwing up (vomiting). ? Changes in how you see. ? Very bad chest pain. ? Seizures. How is this treated?  This condition is treated by making healthy lifestyle changes, such as: ? Eating healthy foods. ? Exercising more. ? Drinking less alcohol.  Your health care provider may prescribe medicine if lifestyle changes are not enough to get your blood pressure under control, and if: ? Your top number is above 130. ? Your bottom  number is above 80.  Your personal target blood pressure may vary. Follow these instructions at home: Eating and drinking   If told, follow the DASH eating plan. To follow this plan: ? Fill one half of your plate at each meal with fruits and vegetables. ? Fill one fourth of your plate at each meal with whole grains. Whole grains include whole-wheat pasta, brown rice, and whole-grain bread. ? Eat or drink low-fat dairy products, such as skim milk or low-fat yogurt. ? Fill one fourth of your plate at each meal with low-fat (lean) proteins. Low-fat proteins include fish, chicken without skin, eggs, beans, and tofu. ? Avoid fatty meat, cured and processed meat, or chicken with skin. ? Avoid pre-made or processed food.  Eat less than 1,500 mg of salt each day.  Do not drink alcohol if: ? Your doctor tells you not to drink. ? You are pregnant, may be pregnant, or are planning to become pregnant.  If you drink alcohol: ? Limit how much you use to:  0-1 drink a day for women.  0-2 drinks a day for men. ? Be aware of how much alcohol is in your drink. In the [place] one drink equals one 12 oz bottle of  beer (355 mL), one 5 oz glass of wine (148 mL), or one 1 oz glass of hard liquor (44 mL). Lifestyle   Work with your doctor to stay at a healthy weight or to lose weight. Ask your doctor what the best weight is for you.  Get at least 30 minutes of exercise most days of the week. This may include walking, swimming, or biking.  Get at least 30 minutes of exercise that strengthens your muscles (resistance exercise) at least 3 days a week. This may include lifting weights or doing Pilates.  Do not use any products that contain nicotine or tobacco, such as cigarettes, e-cigarettes, and chewing tobacco. If you need help quitting, ask your doctor.  Check your blood pressure at home as told by your doctor.  Keep all follow-up visits as told by your doctor. This is important. Medicines  Take  over-the-counter and prescription medicines only as told by your doctor. Follow directions carefully.  Do not skip doses of blood pressure medicine. The medicine does not work as well if you skip doses. Skipping doses also puts you at risk for problems.  Ask your doctor about side effects or reactions to medicines that you should watch for. Contact a doctor if you:  Think you are having a reaction to the medicine you are taking.  Have headaches that keep coming back (recurring).  Feel dizzy.  Have swelling in your ankles.  Have trouble with your vision. Get help right away if you:  Get a very bad headache.  Start to feel mixed up (confused).  Feel weak or numb.  Feel faint.  Have very bad pain in your: ? Chest. ? Belly (abdomen).  Throw up more than once.  Have trouble breathing. Summary  Hypertension is another name for high blood pressure.  High blood pressure forces your heart to work harder to pump blood.  For most people, a normal blood pressure is less than 120/80.  Making healthy choices can help lower blood pressure. If your blood pressure does not get lower with healthy choices, you may need to take medicine. This information is not intended to replace advice given to you by your health care provider. Make sure you discuss any questions you have with your health care provider. Document Released: 11/18/2007 Document Revised: 02/09/2018 Document Reviewed: 02/09/2018 Elsevier Patient Education  2020 [employer].

## 2018-12-20 NOTE — Progress Notes (Signed)
Pt would like 90 day supply of medications

## 2018-12-29 ENCOUNTER — Other Ambulatory Visit (INDEPENDENT_AMBULATORY_CARE_PROVIDER_SITE_OTHER): Payer: BC Managed Care – PPO

## 2018-12-29 ENCOUNTER — Other Ambulatory Visit: Payer: Self-pay

## 2018-12-29 DIAGNOSIS — E781 Pure hyperglyceridemia: Secondary | ICD-10-CM

## 2018-12-29 DIAGNOSIS — I10 Essential (primary) hypertension: Secondary | ICD-10-CM

## 2018-12-29 NOTE — Progress Notes (Signed)
Labs collected by onsite [facility] phlebotomist. Nat Christen, CMA

## 2018-12-30 LAB — CBC WITH DIFFERENTIAL/PLATELET
Basophils Absolute: 0 10*3/uL (ref 0.0–0.2)
Basos: 0 %
EOS (ABSOLUTE): 0.3 10*3/uL (ref 0.0–0.4)
Eos: 5 %
Hematocrit: 37.3 % (ref 34.0–46.6)
Hemoglobin: 12.3 g/dL (ref 11.1–15.9)
Immature Grans (Abs): 0 10*3/uL (ref 0.0–0.1)
Immature Granulocytes: 0 %
Lymphocytes Absolute: 2.1 10*3/uL (ref 0.7–3.1)
Lymphs: 33 %
MCH: 31.2 pg (ref 26.6–33.0)
MCHC: 33 g/dL (ref 31.5–35.7)
MCV: 95 fL (ref 79–97)
Monocytes Absolute: 0.4 10*3/uL (ref 0.1–0.9)
Monocytes: 7 %
Neutrophils Absolute: 3.6 10*3/uL (ref 1.4–7.0)
Neutrophils: 55 %
Platelets: 351 10*3/uL (ref 150–450)
RBC: 3.94 x10E6/uL (ref 3.77–5.28)
RDW: 11.5 % — ABNORMAL LOW (ref 11.7–15.4)
WBC: 6.5 10*3/uL (ref 3.4–10.8)

## 2018-12-30 LAB — LIPID PANEL
Chol/HDL Ratio: 2.7 ratio (ref 0.0–4.4)
Cholesterol, Total: 154 mg/dL (ref 100–199)
HDL: 57 mg/dL (ref >39)
LDL Calculated: 70 mg/dL (ref 0–99)
Triglycerides: 137 mg/dL (ref 0–149)
VLDL Cholesterol Cal: 27 mg/dL (ref 5–40)

## 2018-12-30 LAB — CMP14+EGFR
ALT: 15 IU/L (ref 0–32)
AST: 12 IU/L (ref 0–40)
Albumin/Globulin Ratio: 1.5 (ref 1.2–2.2)
Albumin: 4.1 g/dL (ref 3.8–4.9)
Alkaline Phosphatase: 68 IU/L (ref 39–117)
BUN/Creatinine Ratio: 23 (ref 9–23)
BUN: 17 mg/dL (ref 6–24)
Bilirubin Total: 0.3 mg/dL (ref 0.0–1.2)
CO2: 21 mmol/L (ref 20–29)
Calcium: 9.9 mg/dL (ref 8.7–10.2)
Chloride: 104 mmol/L (ref 96–106)
Creatinine, Ser: 0.73 mg/dL (ref 0.57–1.00)
GFR calc Af Amer: 104 mL/min/{1.73_m2} (ref >59)
GFR calc non Af Amer: 90 mL/min/{1.73_m2} (ref >59)
Globulin, Total: 2.8 g/dL (ref 1.5–4.5)
Glucose: 125 mg/dL — ABNORMAL HIGH (ref 65–99)
Potassium: 3.7 mmol/L (ref 3.5–5.2)
Sodium: 139 mmol/L (ref 134–144)
Total Protein: 6.9 g/dL (ref 6.0–8.5)

## 2019-01-02 ENCOUNTER — Telehealth (INDEPENDENT_AMBULATORY_CARE_PROVIDER_SITE_OTHER): Payer: Self-pay

## 2019-01-02 NOTE — Telephone Encounter (Signed)
Patient is aware that all labs results are normal. Nat Christen, CMA

## 2019-01-02 NOTE — Telephone Encounter (Signed)
-----   Message from Kerin Perna, NP sent at 01/01/2019  1:32 PM EDT ----- Complete blood work , kidney and liver functions normal (gluclose slightly elevated no need to be concerned)

## 2019-01-13 ENCOUNTER — Encounter: Payer: Self-pay | Admitting: Internal Medicine

## 2019-01-16 ENCOUNTER — Ambulatory Visit (INDEPENDENT_AMBULATORY_CARE_PROVIDER_SITE_OTHER): Payer: BC Managed Care – PPO | Admitting: Primary Care

## 2019-01-16 ENCOUNTER — Other Ambulatory Visit: Payer: Self-pay

## 2019-01-16 VITALS — BP 162/90 | HR 76 | Ht 65.5 in | Wt 249.6 lb

## 2019-01-16 DIAGNOSIS — I10 Essential (primary) hypertension: Secondary | ICD-10-CM

## 2019-01-16 DIAGNOSIS — R1011 Right upper quadrant pain: Secondary | ICD-10-CM | POA: Diagnosis not present

## 2019-01-16 DIAGNOSIS — F17210 Nicotine dependence, cigarettes, uncomplicated: Secondary | ICD-10-CM | POA: Diagnosis not present

## 2019-01-16 DIAGNOSIS — Z6830 Body mass index (BMI) 30.0-30.9, adult: Secondary | ICD-10-CM

## 2019-01-16 NOTE — Progress Notes (Signed)
Acute Office Visit  Subjective:    Patient ID: Allison Mcintyre, female    DOB: 29-Nov-1960, 58 y.o.   MRN: [id number]  No chief complaint on file.   HPI Patient is in today for an acute visit rib pain she admits to falling a year ago after that she has had intermittent pain. She admits difficulty breathing hurting when taking breaths.   Past Medical History:  Diagnosis Date  . Hypertension   . Menorrhagia     Past Surgical History:  Procedure Laterality Date  . ABDOMINAL HYSTERECTOMY  2003  . CESAREAN SECTION      Family History  Problem Relation Age of Onset  . Cancer Mother   . Bronchitis Father   . Colon polyps Neg Hx     Social History   Socioeconomic History  . Marital status: Single    Spouse name: Not on file  . Number of children: Not on file  . Years of education: Not on file  . Highest education level: Not on file  Occupational History  . Not on file  Social Needs  . Financial resource strain: Not on file  . Food insecurity    Worry: Not on file    Inability: Not on file  . Transportation needs    Medical: Not on file    Non-medical: Not on file  Tobacco Use  . Smoking status: Current Every Day Smoker    Packs/day: 0.25    Types: Cigarettes  . Smokeless tobacco: Never Used  Substance and Sexual Activity  . Alcohol use: Yes    Alcohol/week: 3.0 standard drinks    Types: 3 Cans of beer per week    Comment: occasional , about weekly  . Drug use: No  . Sexual activity: Yes    Birth control/protection: Surgical  Lifestyle  . Physical activity    Days per week: Not on file    Minutes per session: Not on file  . Stress: Not on file  Relationships  . Social Herbalist on phone: Not on file    Gets together: Not on file    Attends religious service: Not on file    Active member of club or organization: Not on file    Attends meetings of clubs or organizations: Not on file    Relationship status: Not on file  . Intimate partner  violence    Fear of current or ex partner: Not on file    Emotionally abused: Not on file    Physically abused: Not on file    Forced sexual activity: Not on file  Other Topics Concern  . Not on file  Social History Narrative  . Not on file    Outpatient Medications Prior to Visit  Medication Sig Dispense Refill  . albuterol (VENTOLIN HFA) 108 (90 Base) MCG/ACT inhaler Inhale 1-2 puffs into the lungs every 6 (six) hours as needed for wheezing or shortness of breath. 6.7 g 1  . amLODipine (NORVASC) 5 MG tablet Take 1 tablet (5 mg total) by mouth daily. 90 tablet 0  . hydrochlorothiazide (HYDRODIURIL) 25 MG tablet Take 1 tablet (25 mg total) by mouth daily. Take on tablet in the morning. 90 tablet 0  . hydrocortisone (ANUSOL-HC) 25 MG suppository Place 1 suppository (25 mg total) rectally 2 (two) times daily. 12 suppository 0  . ibuprofen (ADVIL,MOTRIN) 600 MG tablet Take 1 tablet (600 mg total) by mouth every 8 (eight) hours as needed. 60 tablet 1  .  omega-3 acid ethyl esters (LOVAZA) 1 g capsule Take 2 capsules (2 g total) by mouth 2 (two) times daily. 360 capsule 1  . pravastatin (PRAVACHOL) 20 MG tablet Take 1 tablet (20 mg total) by mouth daily. 90 tablet 0  . senna (SENOKOT) 8.6 MG TABS tablet Take 1 tablet (8.6 mg total) by mouth daily as needed for mild constipation. 120 tablet 0   No facility-administered medications prior to visit.     No Known Allergies  Review of Systems  Respiratory: Positive for cough and shortness of breath.   Gastrointestinal: Positive for abdominal pain.  Musculoskeletal:       Right side rib pain        Objective:    Physical Exam  Constitutional: She is oriented to person, place, and time. She appears well-developed and well-nourished.  Neck: Neck supple.  Cardiovascular: Normal rate and regular rhythm.  Pulmonary/Chest: Effort normal and breath sounds normal.  Abdominal: Soft. Bowel sounds are normal. There is abdominal tenderness.   Musculoskeletal:     Comments: Right side rib pain   Neurological: She is oriented to person, place, and time.  Skin: Skin is warm.  Psychiatric: She has a normal mood and affect.    There were no vitals taken for this visit. Wt Readings from Last 3 Encounters:  12/20/18 243 lb 9.6 oz (110.5 kg)  08/17/18 236 lb 9.6 oz (107.3 kg)  02/22/18 239 lb (108.4 kg)    Health Maintenance Due  Topic Date Due  . Fecal DNA (Cologuard)  04/16/2009  . INFLUENZA VACCINE  01/14/2019    There are no preventive care reminders to display for this patient.   Lab Results  Component Value Date   TSH 1.160 02/22/2018   Lab Results  Component Value Date   WBC 6.5 12/29/2018   HGB 12.3 12/29/2018   HCT 37.3 12/29/2018   MCV 95 12/29/2018   PLT 351 12/29/2018   Lab Results  Component Value Date   NA 139 12/29/2018   K 3.7 12/29/2018   CO2 21 12/29/2018   GLUCOSE 125 (H) 12/29/2018   BUN 17 12/29/2018   CREATININE 0.73 12/29/2018   BILITOT 0.3 12/29/2018   ALKPHOS 68 12/29/2018   AST 12 12/29/2018   ALT 15 12/29/2018   PROT 6.9 12/29/2018   ALBUMIN 4.1 12/29/2018   CALCIUM 9.9 12/29/2018   ANIONGAP 12 08/06/2017   GFR 89.00 03/14/2015   Lab Results  Component Value Date   CHOL 154 12/29/2018   Lab Results  Component Value Date   HDL 57 12/29/2018   Lab Results  Component Value Date   LDLCALC 70 12/29/2018   Lab Results  Component Value Date   TRIG 137 12/29/2018   Lab Results  Component Value Date   CHOLHDL 2.7 12/29/2018   No results found for: HGBA1C     Assessment & Plan:   Problem List Items Addressed This Visit    None    Allison Mcintyre was seen today for flank pain.  Diagnoses and all orders for this visit:  Abdominal pain, right upper quadrant -     Cancel: FAST [place]; Future -     US Abdomen Limited RUQ; Future  Hypertension, unspecified type Patient ate a pork chop sandwich at lunch  Counseled on blood pressure goal of less than 130/80, low-sodium,  DASH diet, medication compliance, 150 minutes of moderate intensity exercise per week. Discussed medication compliance, adverse effects.  Morbid obesity ([employer]) Obesity is 30-39 indicating an excess  in caloric intake or underlining conditions. This may lead to other co-morbidities. Lifestyle modifications of diet and exercise may reduce obesity.     No orders of the defined types were placed in this encounter.    Kerin Perna, NP

## 2019-01-16 NOTE — Patient Instructions (Signed)
Abdominal Pain, Adult    Many things can cause belly (abdominal) pain. Most times, belly pain is not dangerous. Many cases of belly pain can be watched and treated at home. Sometimes belly pain is serious, though. Your doctor will try to find the cause of your belly pain.  Follow these instructions at home:  · Take over-the-counter and prescription medicines only as told by your doctor. Do not take medicines that help you poop (laxatives) unless told to by your doctor.  · Drink enough fluid to keep your pee (urine) clear or pale yellow.  · Watch your belly pain for any changes.  · Keep all follow-up visits as told by your doctor. This is important.  Contact a doctor if:  · Your belly pain changes or gets worse.  · You are not hungry, or you lose weight without trying.  · You are having trouble pooping (constipated) or have watery poop (diarrhea) for more than 2-3 days.  · You have pain when you pee or poop.  · Your belly pain wakes you up at night.  · Your pain gets worse with meals, after eating, or with certain foods.  · You are throwing up and cannot keep anything down.  · You have a fever.  Get help right away if:  · Your pain does not go away as soon as your doctor says it should.  · You cannot stop throwing up.  · Your pain is only in areas of your belly, such as the right side or the left lower part of the belly.  · You have bloody or black poop, or poop that looks like tar.  · You have very bad pain, cramping, or bloating in your belly.  · You have signs of not having enough fluid or water in your body (dehydration), such as:  ? Dark pee, very little pee, or no pee.  ? Cracked lips.  ? Dry mouth.  ? Sunken eyes.  ? Sleepiness.  ? Weakness.  This information is not intended to replace advice given to you by your health care provider. Make sure you discuss any questions you have with your health care provider.  Document Released: 11/18/2007 Document Revised: 12/20/2015 Document Reviewed: 11/13/2015  Elsevier  Interactive Patient Education © 2020 [employer]

## 2019-01-23 ENCOUNTER — Ambulatory Visit (HOSPITAL_COMMUNITY)
Admission: RE | Admit: 2019-01-23 | Discharge: 2019-01-23 | Disposition: A | Discharge status: Home / Self Care | Payer: BC Managed Care – PPO | Source: Ambulatory Visit | Attending: Primary Care | Admitting: Primary Care

## 2019-01-23 ENCOUNTER — Other Ambulatory Visit: Payer: Self-pay

## 2019-01-23 DIAGNOSIS — R1011 Right upper quadrant pain: Secondary | ICD-10-CM | POA: Diagnosis not present

## 2019-01-30 ENCOUNTER — Telehealth (INDEPENDENT_AMBULATORY_CARE_PROVIDER_SITE_OTHER): Payer: Self-pay | Admitting: Primary Care

## 2019-01-30 NOTE — Telephone Encounter (Signed)
Informed patient that results have not been reviewed by provider yet and she verbalized understanding.

## 2019-01-30 NOTE — Telephone Encounter (Signed)
Pt would like results for an abdomen US completed on 01/23/2019. Please follow up

## 2019-02-15 ENCOUNTER — Other Ambulatory Visit (INDEPENDENT_AMBULATORY_CARE_PROVIDER_SITE_OTHER): Payer: Self-pay | Admitting: Primary Care

## 2019-03-01 ENCOUNTER — Ambulatory Visit (INDEPENDENT_AMBULATORY_CARE_PROVIDER_SITE_OTHER): Payer: BC Managed Care – PPO | Admitting: Primary Care

## 2019-03-01 ENCOUNTER — Encounter (INDEPENDENT_AMBULATORY_CARE_PROVIDER_SITE_OTHER): Payer: Self-pay | Admitting: Primary Care

## 2019-03-01 ENCOUNTER — Other Ambulatory Visit: Payer: Self-pay

## 2019-03-01 VITALS — BP 141/74 | HR 67 | Temp 97.2°F | Ht 65.5 in | Wt 251.0 lb

## 2019-03-01 DIAGNOSIS — G4485 Primary stabbing headache: Secondary | ICD-10-CM

## 2019-03-01 DIAGNOSIS — I10 Essential (primary) hypertension: Secondary | ICD-10-CM

## 2019-03-01 DIAGNOSIS — Z713 Dietary counseling and surveillance: Secondary | ICD-10-CM | POA: Diagnosis not present

## 2019-03-01 DIAGNOSIS — F17200 Nicotine dependence, unspecified, uncomplicated: Secondary | ICD-10-CM

## 2019-03-01 DIAGNOSIS — F17210 Nicotine dependence, cigarettes, uncomplicated: Secondary | ICD-10-CM

## 2019-03-01 DIAGNOSIS — R42 Dizziness and giddiness: Secondary | ICD-10-CM | POA: Diagnosis not present

## 2019-03-01 DIAGNOSIS — Z6841 Body Mass Index (BMI) 40.0 and over, adult: Secondary | ICD-10-CM

## 2019-03-01 NOTE — Progress Notes (Signed)
-Severe headaches anxiety 146 [id number]/[id number] Marinus Maw;  Acute Office Visit  Subjective:    Patient ID: Allison Mcintyre, female    DOB: 12-17-1960, 58 y.o.   MRN: [id number]  Chief Complaint  Patient presents with  . Headache    HPI Patient is in today for an acute visit she started having a headache with no relief became lightheaded and dizzy called from work to schedule appointment. After speaking with her the cause of her headache was probably due to her eating "6" pork chops and she had previously cutting down on her pork intake for her Bp. She Denies shortness of breath, chest pain or lower extremity edema. She does endorse headache.  Past Medical History:  Diagnosis Date  . Hypertension   . Menorrhagia     Past Surgical History:  Procedure Laterality Date  . ABDOMINAL HYSTERECTOMY  2003  . CESAREAN SECTION      Family History  Problem Relation Age of Onset  . Cancer Mother   . Bronchitis Father   . Colon polyps Neg Hx     Social History   Socioeconomic History  . Marital status: Single    Spouse name: Not on file  . Number of children: Not on file  . Years of education: Not on file  . Highest education level: Not on file  Occupational History  . Not on file  Social Needs  . Financial resource strain: Not on file  . Food insecurity    Worry: Not on file    Inability: Not on file  . Transportation needs    Medical: Not on file    Non-medical: Not on file  Tobacco Use  . Smoking status: Current Every Day Smoker    Packs/day: 0.25    Types: Cigarettes  . Smokeless tobacco: Never Used  Substance and Sexual Activity  . Alcohol use: Yes    Alcohol/week: 3.0 standard drinks    Types: 3 Cans of beer per week    Comment: occasional , about weekly  . Drug use: No  . Sexual activity: Yes    Birth control/protection: Surgical  Lifestyle  . Physical activity    Days per week: Not on file    Minutes per session: Not on file  . Stress: Not on file  Relationships  .  Social Herbalist on phone: Not on file    Gets together: Not on file    Attends religious service: Not on file    Active member of club or organization: Not on file    Attends meetings of clubs or organizations: Not on file    Relationship status: Not on file  . Intimate partner violence    Fear of current or ex partner: Not on file    Emotionally abused: Not on file    Physically abused: Not on file    Forced sexual activity: Not on file  Other Topics Concern  . Not on file  Social History Narrative  . Not on file    Outpatient Medications Prior to Visit  Medication Sig Dispense Refill  . albuterol (VENTOLIN HFA) 108 (90 Base) MCG/ACT inhaler Inhale 1-2 puffs into the lungs every 6 (six) hours as needed for wheezing or shortness of breath. 6.7 g 1  . amLODipine (NORVASC) 5 MG tablet Take 1 tablet (5 mg total) by mouth daily. 90 tablet 0  . hydrochlorothiazide (HYDRODIURIL) 25 MG tablet Take 1 tablet (25 mg total) by mouth daily. Take on tablet in  the morning. 90 tablet 0  . ibuprofen (ADVIL) 600 MG tablet TAKE 1 TABLET BY MOUTH EVERY 8 HOURS AS NEEDED 60 tablet 0  . pravastatin (PRAVACHOL) 20 MG tablet Take 1 tablet (20 mg total) by mouth daily. 90 tablet 0  . hydrocortisone (ANUSOL-HC) 25 MG suppository Place 1 suppository (25 mg total) rectally 2 (two) times daily. 12 suppository 0  . omega-3 acid ethyl esters (LOVAZA) 1 g capsule Take 2 capsules (2 g total) by mouth 2 (two) times daily. 360 capsule 1  . senna (SENOKOT) 8.6 MG TABS tablet Take 1 tablet (8.6 mg total) by mouth daily as needed for mild constipation. 120 tablet 0   No facility-administered medications prior to visit.     No Known Allergies  Review of Systems  Neurological: Positive for headaches.  All other systems reviewed and are negative.      Objective:    Physical Exam  Constitutional: She is oriented to person, place, and time. She appears well-developed and well-nourished.  obese  HENT:   Head: Normocephalic.  Neck: Normal range of motion. Neck supple.  Cardiovascular: Normal rate and regular rhythm.  Pulmonary/Chest: Effort normal and breath sounds normal.  Abdominal: Soft. Bowel sounds are normal. She exhibits distension.  Musculoskeletal: Normal range of motion.  Neurological: She is oriented to person, place, and time.  Skin: Skin is warm and dry.  Psychiatric: She has a normal mood and affect.    BP (!) 141/74 (BP Location: Left Arm, Patient Position: Sitting, Cuff Size: Large)   Pulse 67   Temp (!) 97.2 F (36.2 C) (Tympanic)   Ht 5' 5.5" (1.664 m)   Wt 251 lb (113.9 kg)   SpO2 94%   BMI 41.13 kg/m  Wt Readings from Last 3 Encounters:  03/01/19 251 lb (113.9 kg)  01/16/19 249 lb 9.6 oz (113.2 kg)  12/20/18 243 lb 9.6 oz (110.5 kg)    Health Maintenance Due  Topic Date Due  . Fecal DNA (Cologuard)  04/16/2009  . INFLUENZA VACCINE  01/14/2019    There are no preventive care reminders to display for this patient.   Lab Results  Component Value Date   TSH 1.160 02/22/2018   Lab Results  Component Value Date   WBC 6.5 12/29/2018   HGB 12.3 12/29/2018   HCT 37.3 12/29/2018   MCV 95 12/29/2018   PLT 351 12/29/2018   Lab Results  Component Value Date   NA 139 12/29/2018   K 3.7 12/29/2018   CO2 21 12/29/2018   GLUCOSE 125 (H) 12/29/2018   BUN 17 12/29/2018   CREATININE 0.73 12/29/2018   BILITOT 0.3 12/29/2018   ALKPHOS 68 12/29/2018   AST 12 12/29/2018   ALT 15 12/29/2018   PROT 6.9 12/29/2018   ALBUMIN 4.1 12/29/2018   CALCIUM 9.9 12/29/2018   ANIONGAP 12 08/06/2017   GFR 89.00 03/14/2015   Lab Results  Component Value Date   CHOL 154 12/29/2018   Lab Results  Component Value Date   HDL 57 12/29/2018   Lab Results  Component Value Date   LDLCALC 70 12/29/2018   Lab Results  Component Value Date   TRIG 137 12/29/2018   Lab Results  Component Value Date   CHOLHDL 2.7 12/29/2018   No results found for: HGBA1C      Assessment & Plan:  Allison Mcintyre was seen today for headache.  Diagnoses and all orders for this visit:  Hypertension, unspecified type Counseled on blood pressure goal of less  than 130/80, low-sodium, DASH diet, medication compliance, 150 minutes of moderate intensity exercise per week. Discussed medication compliance, adverse effects.  Smoker Tobacco products contain nicotine.Triggers, that make the body feel stimulated and works on areas of the brain to make you feel good.  It is very difficult to stop smoking without help or medication.  Smoking can affect every organ in the body.  It is also primary cause of lung cancer and other respiratory diseases  Primary stabbing headache if worsening HA, changes vision/speech, imbalance, weakness go to the ER. Headache Management:   Cool Compress. Lie down and place a cool compress on your head.   Avoid headache triggers. If certain foods or odors seem to have triggered your migraines in the past, avoid them. A headache diary might help you identify triggers.   Include physical activity in your daily routine. Try a daily walk or other moderate aerobic exercise.   Manage stress. Find healthy ways to cope with the stressors, such as delegating tasks on your to-do list.   Practice relaxation techniques. Try deep breathing, yoga, massage and visualization.   Eat regularly. Eating regularly scheduled meals and maintaining a healthy diet might help prevent headaches. Also, drink plenty of fluids.   Follow a regular sleep schedule. Sleep deprivation might contribute to headaches  Consider biofeedback. With this mind-body technique, you learn to control certain bodily functions - such as muscle tension, heart rate and blood pressure - to prevent headaches or reduce headache pain.   Dietary counseling Cut back on carbs. The most important part is to cut back on sugars and starches Eat protein, fat, and vegetables. Meals should include a protein  source, fat source. Try to exercise 30 minutes daily.    No orders of the defined types were placed in this encounter.    Kerin Perna, NP

## 2019-03-02 ENCOUNTER — Ambulatory Visit (INDEPENDENT_AMBULATORY_CARE_PROVIDER_SITE_OTHER): Payer: BC Managed Care – PPO | Admitting: Primary Care

## 2019-03-06 ENCOUNTER — Encounter (INDEPENDENT_AMBULATORY_CARE_PROVIDER_SITE_OTHER): Payer: Self-pay | Admitting: Primary Care

## 2019-04-03 ENCOUNTER — Encounter (INDEPENDENT_AMBULATORY_CARE_PROVIDER_SITE_OTHER): Payer: Self-pay | Admitting: Primary Care

## 2019-04-03 ENCOUNTER — Ambulatory Visit (INDEPENDENT_AMBULATORY_CARE_PROVIDER_SITE_OTHER): Payer: BC Managed Care – PPO | Admitting: Primary Care

## 2019-04-03 ENCOUNTER — Other Ambulatory Visit: Payer: Self-pay

## 2019-04-03 VITALS — BP 122/77 | HR 73 | Temp 97.3°F | Ht 65.5 in | Wt 255.0 lb

## 2019-04-03 DIAGNOSIS — Z1211 Encounter for screening for malignant neoplasm of colon: Secondary | ICD-10-CM | POA: Diagnosis not present

## 2019-04-03 DIAGNOSIS — I10 Essential (primary) hypertension: Secondary | ICD-10-CM | POA: Diagnosis not present

## 2019-04-03 DIAGNOSIS — Z716 Tobacco abuse counseling: Secondary | ICD-10-CM

## 2019-04-03 DIAGNOSIS — Z23 Encounter for immunization: Secondary | ICD-10-CM

## 2019-04-03 NOTE — Progress Notes (Signed)
Established Patient Office Visit  Subjective:  Patient ID: Allison Mcintyre, female    DOB: [birth year]  Age: 58 y.o. MRN: [id number]  CC:  Chief Complaint  Patient presents with  . Blood Pressure Check  . Nicotine Dependence    HPI Allison Mcintyre presents for management of hypertension she denies shortness of breath, headaches, chest pain or lower extremity edema.  Past Medical History:  Diagnosis Date  . Hypertension   . Menorrhagia     Past Surgical History:  Procedure Laterality Date  . ABDOMINAL HYSTERECTOMY  2003  . CESAREAN SECTION      Family History  Problem Relation Age of Onset  . Cancer Mother   . Bronchitis Father   . Colon polyps Neg Hx     Social History   Socioeconomic History  . Marital status: Single    Spouse name: Not on file  . Number of children: Not on file  . Years of education: Not on file  . Highest education level: Not on file  Occupational History  . Not on file  Social Needs  . Financial resource strain: Not on file  . Food insecurity    Worry: Not on file    Inability: Not on file  . Transportation needs    Medical: Not on file    Non-medical: Not on file  Tobacco Use  . Smoking status: Current Every Day Smoker    Packs/day: 0.25    Types: Cigarettes  . Smokeless tobacco: Never Used  Substance and Sexual Activity  . Alcohol use: Yes    Alcohol/week: 3.0 standard drinks    Types: 3 Cans of beer per week    Comment: occasional , about weekly  . Drug use: No  . Sexual activity: Yes    Birth control/protection: Surgical  Lifestyle  . Physical activity    Days per week: Not on file    Minutes per session: Not on file  . Stress: Not on file  Relationships  . Social Herbalist on phone: Not on file    Gets together: Not on file    Attends religious service: Not on file    Active member of club or organization: Not on file    Attends meetings of clubs or organizations: Not on file    Relationship  status: Not on file  . Intimate partner violence    Fear of current or ex partner: Not on file    Emotionally abused: Not on file    Physically abused: Not on file    Forced sexual activity: Not on file  Other Topics Concern  . Not on file  Social History Narrative  . Not on file    Outpatient Medications Prior to Visit  Medication Sig Dispense Refill  . albuterol (VENTOLIN HFA) 108 (90 Base) MCG/ACT inhaler Inhale 1-2 puffs into the lungs every 6 (six) hours as needed for wheezing or shortness of breath. 6.7 g 1  . amLODipine (NORVASC) 5 MG tablet Take 1 tablet (5 mg total) by mouth daily. 90 tablet 0  . hydrochlorothiazide (HYDRODIURIL) 25 MG tablet Take 1 tablet (25 mg total) by mouth daily. Take on tablet in the morning. 90 tablet 0  . ibuprofen (ADVIL) 600 MG tablet TAKE 1 TABLET BY MOUTH EVERY 8 HOURS AS NEEDED 60 tablet 0  . pravastatin (PRAVACHOL) 20 MG tablet Take 1 tablet (20 mg total) by mouth daily. 90 tablet 0   No facility-administered medications prior to  visit.     No Known Allergies  ROS Review of Systems  All other systems reviewed and are negative.     Objective:    Physical Exam  Constitutional: She is oriented to person, place, and time. She appears well-developed and well-nourished.  Neck: Neck supple.  Cardiovascular: Normal rate and regular rhythm.  Pulmonary/Chest: Effort normal and breath sounds normal.  Abdominal: Soft. Bowel sounds are normal. She exhibits distension.  Musculoskeletal: Normal range of motion.  Neurological: She is oriented to person, place, and time.  Psychiatric: She has a normal mood and affect.    BP 122/77 (BP Location: Left Arm, Patient Position: Sitting, Cuff Size: Large)   Pulse 73   Temp (!) 97.3 F (36.3 C) (Temporal)   Ht 5' 5.5" (1.664 m)   Wt 255 lb (115.7 kg)   SpO2 95%   BMI 41.79 kg/m  Wt Readings from Last 3 Encounters:  04/03/19 255 lb (115.7 kg)  03/01/19 251 lb (113.9 kg)  01/16/19 249 lb 9.6 oz  (113.2 kg)     Health Maintenance Due  Topic Date Due  . Fecal DNA (Cologuard)  04/16/2009  . INFLUENZA VACCINE  01/14/2019    There are no preventive care reminders to display for this patient.  Lab Results  Component Value Date   TSH 1.160 02/22/2018   Lab Results  Component Value Date   WBC 6.5 12/29/2018   HGB 12.3 12/29/2018   HCT 37.3 12/29/2018   MCV 95 12/29/2018   PLT 351 12/29/2018   Lab Results  Component Value Date   NA 139 12/29/2018   K 3.7 12/29/2018   CO2 21 12/29/2018   GLUCOSE 125 (H) 12/29/2018   BUN 17 12/29/2018   CREATININE 0.73 12/29/2018   BILITOT 0.3 12/29/2018   ALKPHOS 68 12/29/2018   AST 12 12/29/2018   ALT 15 12/29/2018   PROT 6.9 12/29/2018   ALBUMIN 4.1 12/29/2018   CALCIUM 9.9 12/29/2018   ANIONGAP 12 08/06/2017   GFR 89.00 03/14/2015   Lab Results  Component Value Date   CHOL 154 12/29/2018   Lab Results  Component Value Date   HDL 57 12/29/2018   Lab Results  Component Value Date   LDLCALC 70 12/29/2018   Lab Results  Component Value Date   TRIG 137 12/29/2018   Lab Results  Component Value Date   CHOLHDL 2.7 12/29/2018   No results found for: HGBA1C    Assessment & Plan:  Allison Mcintyre was seen today for blood pressure check and nicotine dependence.  Diagnoses and all orders for this visit:  Hypertension, unspecified type Controlled 122/77 at goal continue  amlodipine 5mg  daily and HCTZ 25mg . Counseled on continuing low-sodium, diet, medication compliance, 150 minutes of moderate intensity exercise per week.  Colon cancer screening -     Fecal occult blood, imunochemical; Future  Tobacco abuse counseling Smoking cessation instruction/counseling given:  Smokes 10 cigarette daily for 10 years. The patient was counseled on the dangers of tobacco use, and was advised to quit.  Reviewed strategies to maximize success, including decreasing stressors   Smoking cessation instruction/counseling given:    No orders  of the defined types were placed in this encounter.   Follow-up: Return in about 6 months (around 10/02/2019) for HTN/labs inperson .    Kerin Perna, NP

## 2019-05-03 ENCOUNTER — Encounter: Payer: Self-pay | Admitting: Internal Medicine

## 2019-05-08 ENCOUNTER — Other Ambulatory Visit: Payer: Self-pay | Admitting: Primary Care

## 2019-05-08 DIAGNOSIS — Z1231 Encounter for screening mammogram for malignant neoplasm of breast: Secondary | ICD-10-CM

## 2019-05-30 ENCOUNTER — Encounter: Payer: Self-pay | Admitting: Internal Medicine

## 2019-05-30 ENCOUNTER — Ambulatory Visit (AMBULATORY_SURGERY_CENTER): Payer: BC Managed Care – PPO | Admitting: *Deleted

## 2019-05-30 ENCOUNTER — Other Ambulatory Visit: Payer: Self-pay

## 2019-05-30 VITALS — Temp 96.8°F | Ht 65.5 in | Wt 258.0 lb

## 2019-05-30 DIAGNOSIS — Z1159 Encounter for screening for other viral diseases: Secondary | ICD-10-CM

## 2019-05-30 DIAGNOSIS — Z86010 Personal history of colonic polyps: Secondary | ICD-10-CM

## 2019-05-30 MED ORDER — SUPREP BOWEL PREP KIT 17.5-3.13-1.6 GM/177ML PO SOLN: 1.0000 | Freq: Once | ORAL | 0 refills | Status: AC

## 2019-05-30 NOTE — Progress Notes (Signed)
No egg or soy allergy known to patient  No issues with past sedation with any surgeries  or procedures, no intubation problems  No diet pills per patient No home 02 use per patient  No blood thinners per patient  Pt denies issues with constipation  No A fib or A flutter  EMMI video sent to pt's e mail  Sup $15 coupon  Pt states she has someone coming to drop her off and come back to get her- explained To her the CP has to be here 2-3 hours with her- she said she will work it out if possible    Due to the COVID-19 pandemic we are asking patients to follow these guidelines. Please only bring one care partner. Please be aware that your care partner may wait in the car in the parking lot or if they feel like they will be too hot to wait in the car, they may wait in the lobby on the 4th floor. All care partners are required to wear a mask the entire time (we do not have any that we can provide them), they need to practice social distancing, and we will do a Covid check for all patient's and care partners when you arrive. Also we will check their temperature and your temperature. If the care partner waits in their car they need to stay in the parking lot the entire time and we will call them on their cell phone when the patient is ready for discharge so they can bring the car to the front of the building. Also all patient's will need to wear a mask into building.

## 2019-06-02 ENCOUNTER — Ambulatory Visit (INDEPENDENT_AMBULATORY_CARE_PROVIDER_SITE_OTHER): Payer: BC Managed Care – PPO

## 2019-06-02 DIAGNOSIS — Z1159 Encounter for screening for other viral diseases: Secondary | ICD-10-CM

## 2019-06-05 LAB — SARS CORONAVIRUS 2 (TAT 6-24 HRS): SARS Coronavirus 2: NEGATIVE

## 2019-06-07 ENCOUNTER — Encounter: Payer: BC Managed Care – PPO | Admitting: Internal Medicine

## 2019-06-07 ENCOUNTER — Telehealth: Payer: Self-pay | Admitting: Internal Medicine

## 2019-06-07 NOTE — Telephone Encounter (Signed)
Hi Dr. Hilarie Fredrickson this patient just canceled her colonoscopy that is scheduled for today at 11:00am. She states she has issues with her transportation and has rescheduled her procedure for 07/10/19. Thank you

## 2019-06-13 ENCOUNTER — Other Ambulatory Visit (INDEPENDENT_AMBULATORY_CARE_PROVIDER_SITE_OTHER): Payer: Self-pay | Admitting: Family Medicine

## 2019-06-13 ENCOUNTER — Other Ambulatory Visit (INDEPENDENT_AMBULATORY_CARE_PROVIDER_SITE_OTHER): Payer: Self-pay | Admitting: Primary Care

## 2019-06-13 DIAGNOSIS — I10 Essential (primary) hypertension: Secondary | ICD-10-CM

## 2019-06-14 ENCOUNTER — Other Ambulatory Visit (INDEPENDENT_AMBULATORY_CARE_PROVIDER_SITE_OTHER): Payer: Self-pay | Admitting: Primary Care

## 2019-06-14 ENCOUNTER — Telehealth (INDEPENDENT_AMBULATORY_CARE_PROVIDER_SITE_OTHER): Payer: Self-pay | Admitting: Primary Care

## 2019-06-14 DIAGNOSIS — I10 Essential (primary) hypertension: Secondary | ICD-10-CM

## 2019-06-14 MED ORDER — AMLODIPINE BESYLATE 5 MG PO TABS: 5.0000 mg | ORAL_TABLET | Freq: Every day | ORAL | 1 refills | Status: DC

## 2019-06-14 MED ORDER — HYDROCHLOROTHIAZIDE 25 MG PO TABS: ORAL_TABLET | ORAL | 1 refills | Status: DC

## 2019-06-14 MED ORDER — IBUPROFEN 600 MG PO TABS: 600.0000 mg | ORAL_TABLET | Freq: Three times a day (TID) | ORAL | 1 refills | Status: DC | PRN

## 2019-06-14 NOTE — Telephone Encounter (Signed)
Pt would like additional refills of her BP meds be sent to her pharmacy if possible, she would like to avoid calling our office every month, her next follow up is 10/03/2018. Please follow up

## 2019-06-20 NOTE — Progress Notes (Unsigned)
Pt called for updated instructions for her colonoscopy scheduled 1/25.  Printed and mailed pt new instructions.

## 2019-06-21 ENCOUNTER — Ambulatory Visit: Payer: BC Managed Care – PPO | Admitting: Internal Medicine

## 2019-06-28 ENCOUNTER — Ambulatory Visit: Payer: BC Managed Care – PPO

## 2019-06-29 ENCOUNTER — Telehealth: Payer: Self-pay | Admitting: Internal Medicine

## 2019-06-29 NOTE — Telephone Encounter (Signed)
I confirmed pt's address and mailed new prep instructions to the patient today! Pt is aware.

## 2019-06-30 ENCOUNTER — Ambulatory Visit
Admission: RE | Admit: 2019-06-30 | Discharge: 2019-06-30 | Disposition: A | Discharge status: Home / Self Care | Payer: BC Managed Care – PPO | Source: Ambulatory Visit | Attending: Primary Care | Admitting: Primary Care

## 2019-06-30 ENCOUNTER — Other Ambulatory Visit: Payer: Self-pay

## 2019-06-30 DIAGNOSIS — Z1231 Encounter for screening mammogram for malignant neoplasm of breast: Secondary | ICD-10-CM

## 2019-07-05 ENCOUNTER — Other Ambulatory Visit: Payer: Self-pay | Admitting: Internal Medicine

## 2019-07-05 ENCOUNTER — Ambulatory Visit (INDEPENDENT_AMBULATORY_CARE_PROVIDER_SITE_OTHER): Payer: BC Managed Care – PPO

## 2019-07-05 DIAGNOSIS — Z1159 Encounter for screening for other viral diseases: Secondary | ICD-10-CM

## 2019-07-05 LAB — SARS CORONAVIRUS 2 (TAT 6-24 HRS): SARS Coronavirus 2: NEGATIVE

## 2019-07-07 ENCOUNTER — Telehealth (INDEPENDENT_AMBULATORY_CARE_PROVIDER_SITE_OTHER): Payer: Self-pay

## 2019-07-07 NOTE — Telephone Encounter (Signed)
Advised patient to contact [facility] for mammogram results.

## 2019-07-07 NOTE — Telephone Encounter (Signed)
Patient called wanting to know if her mammograph results have been address. If so patient would like a call back with results.  Please advice [phone]

## 2019-07-10 ENCOUNTER — Other Ambulatory Visit: Payer: Self-pay

## 2019-07-10 ENCOUNTER — Encounter: Payer: Self-pay | Admitting: Internal Medicine

## 2019-07-10 ENCOUNTER — Ambulatory Visit (AMBULATORY_SURGERY_CENTER): Payer: BC Managed Care – PPO | Admitting: Internal Medicine

## 2019-07-10 VITALS — BP 145/83 | HR 51 | Temp 96.9°F | Resp 19 | Ht 60.5 in | Wt 258.0 lb

## 2019-07-10 DIAGNOSIS — Z86010 Personal history of colonic polyps: Secondary | ICD-10-CM

## 2019-07-10 MED ORDER — SODIUM CHLORIDE 0.9 % IV SOLN: 500.0000 mL | Freq: Once | INTRAVENOUS | Status: DC

## 2019-07-10 NOTE — Progress Notes (Signed)
Temp-jb  V/s-dt

## 2019-07-10 NOTE — Patient Instructions (Signed)

## 2019-07-10 NOTE — Progress Notes (Signed)
Report given to PACU, vss 

## 2019-07-10 NOTE — Op Note (Signed)
[facility] Patient Name: Allison Mcintyre Procedure Date: 07/10/2019 2:03 PM MRN: [id number] Endoscopist: Jerene Bears , MD Age: 59 Referring MD:  Date of Birth: 11/11/1960 Gender: Female Account #: [id number] Procedure:                Colonoscopy Indications:              High risk colon cancer surveillance: Personal                            history of non-advanced adenoma, Last colonoscopy:                            September 2015 Medicines:                Monitored Anesthesia Care Procedure:                Pre-Anesthesia Assessment:                           - Prior to the procedure, a History and Physical                            was performed, and patient medications and                            allergies were reviewed. The patient's tolerance of                            previous anesthesia was also reviewed. The risks                            and benefits of the procedure and the sedation                            options and risks were discussed with the patient.                            All questions were answered, and informed consent                            was obtained. Prior Anticoagulants: The patient has                            taken no previous anticoagulant or antiplatelet                            agents. ASA Grade Assessment: III - A patient with                            severe systemic disease. After reviewing the risks                            and benefits, the patient was deemed in  satisfactory condition to undergo the procedure.                           After obtaining informed consent, the colonoscope                            was passed under direct vision. Throughout the                            procedure, the patient's blood pressure, pulse, and                            oxygen saturations were monitored continuously. The                            Colonoscope was introduced through the anus  and                            advanced to the cecum, identified by appendiceal                            orifice and ileocecal valve. The colonoscopy was                            performed without difficulty. The patient tolerated                            the procedure well. The quality of the bowel                            preparation was good. The ileocecal valve,                            appendiceal orifice, and rectum were photographed. Scope In: 2:06:37 PM Scope Out: 2:27:15 PM Scope Withdrawal Time: 0 hours 16 minutes 22 seconds  Total Procedure Duration: 0 hours 20 minutes 38 seconds  Findings:                 The digital rectal exam was normal.                           The entire examined colon appeared normal.                           Internal hemorrhoids were found during                            retroflexion. The hemorrhoids were small. Complications:            No immediate complications. Estimated Blood Loss:     Estimated blood loss: none. Impression:               - The entire examined colon is normal.                           - Internal hemorrhoids.                           -  No specimens collected. Recommendation:           - Patient has a contact number available for                            emergencies. The signs and symptoms of potential                            delayed complications were discussed with the                            patient. Return to normal activities tomorrow.                            Written discharge instructions were provided to the                            patient.                           - Resume previous diet.                           - Continue present medications.                           - Repeat colonoscopy in 10 years for surveillance. Jerene Bears, MD 07/10/2019 2:30:07 PM This report has been signed electronically.

## 2019-07-12 ENCOUNTER — Telehealth: Payer: Self-pay | Admitting: *Deleted

## 2019-07-12 ENCOUNTER — Telehealth: Payer: Self-pay

## 2019-07-12 NOTE — Telephone Encounter (Signed)
  Follow up Call-  Call back number 07/10/2019  Post procedure Call Back phone  # [phone]  Permission to leave phone message Yes  Some recent data might be hidden     Patient questions:  Do you have a fever, pain , or abdominal swelling? No. Pain Score  0 *  Have you tolerated food without any problems? Yes.    Have you been able to return to your normal activities? Yes.    Do you have any questions about your discharge instructions: Diet   No. Medications  No. Follow up visit  No.  Do you have questions or concerns about your Care? No.  Actions: * If pain score is 4 or above: No action needed, pain <4.  1. Have you developed a fever since your procedure? no  2.   Have you had an respiratory symptoms (SOB or cough) since your procedure? no  3.   Have you tested positive for COVID 19 since your procedure no  4.   Have you had any family members/close contacts diagnosed with the COVID 19 since your procedure?  no   If yes to any of these questions please route to Joylene John, RN and Alphonsa Gin, Therapist, sports.

## 2019-07-12 NOTE — Telephone Encounter (Signed)
  Follow up Call-  Call back number 07/10/2019  Post procedure Call Back phone  # [phone]  Permission to leave phone message Yes  Some recent data might be hidden     Patient questions:  Do you have a fever, pain , or abdominal swelling? No. Pain Score  0 *  Have you tolerated food without any problems? Yes.    Have you been able to return to your normal activities? Yes.    Do you have any questions about your discharge instructions: Diet   No. Medications  No. Follow up visit  No.  Do you have questions or concerns about your Care? No.  Actions: * If pain score is 4 or above: No action needed, pain <4.  1. Have you developed a fever since your procedure? no  2.   Have you had an respiratory symptoms (SOB or cough) since your procedure? no  3.   Have you tested positive for COVID 19 since your procedure no  4.   Have you had any family members/close contacts diagnosed with the COVID 19 since your procedure?  no   If yes to any of these questions please route to Joylene John, RN and Alphonsa Gin, Therapist, sports.

## 2019-07-15 ENCOUNTER — Other Ambulatory Visit (INDEPENDENT_AMBULATORY_CARE_PROVIDER_SITE_OTHER): Payer: Self-pay | Admitting: Primary Care

## 2019-08-24 ENCOUNTER — Ambulatory Visit: Payer: BC Managed Care – PPO

## 2019-09-05 ENCOUNTER — Other Ambulatory Visit (INDEPENDENT_AMBULATORY_CARE_PROVIDER_SITE_OTHER): Payer: Self-pay | Admitting: Primary Care

## 2019-09-05 DIAGNOSIS — Z760 Encounter for issue of repeat prescription: Secondary | ICD-10-CM

## 2019-10-03 ENCOUNTER — Encounter (INDEPENDENT_AMBULATORY_CARE_PROVIDER_SITE_OTHER): Payer: Self-pay | Admitting: Primary Care

## 2019-10-03 ENCOUNTER — Ambulatory Visit (INDEPENDENT_AMBULATORY_CARE_PROVIDER_SITE_OTHER): Payer: BC Managed Care – PPO | Admitting: Primary Care

## 2019-10-03 ENCOUNTER — Other Ambulatory Visit: Payer: Self-pay

## 2019-10-03 VITALS — BP 125/72 | HR 73 | Temp 97.3°F | Ht 65.5 in | Wt 256.8 lb

## 2019-10-03 DIAGNOSIS — Z760 Encounter for issue of repeat prescription: Secondary | ICD-10-CM | POA: Diagnosis not present

## 2019-10-03 DIAGNOSIS — M25561 Pain in right knee: Secondary | ICD-10-CM

## 2019-10-03 DIAGNOSIS — Z716 Tobacco abuse counseling: Secondary | ICD-10-CM | POA: Diagnosis not present

## 2019-10-03 DIAGNOSIS — I10 Essential (primary) hypertension: Secondary | ICD-10-CM

## 2019-10-03 DIAGNOSIS — G8929 Other chronic pain: Secondary | ICD-10-CM

## 2019-10-03 DIAGNOSIS — M25562 Pain in left knee: Secondary | ICD-10-CM

## 2019-10-03 MED ORDER — ALBUTEROL SULFATE HFA 108 (90 BASE) MCG/ACT IN AERS: INHALATION_SPRAY | RESPIRATORY_TRACT | 2 refills | Status: DC

## 2019-10-03 MED ORDER — AMLODIPINE BESYLATE 5 MG PO TABS: 5.0000 mg | ORAL_TABLET | Freq: Every day | ORAL | 1 refills | Status: DC

## 2019-10-03 MED ORDER — HYDROCHLOROTHIAZIDE 25 MG PO TABS: ORAL_TABLET | ORAL | 1 refills | Status: DC

## 2019-10-03 MED ORDER — IBUPROFEN 800 MG PO TABS: 600.0000 mg | ORAL_TABLET | Freq: Three times a day (TID) | ORAL | 1 refills | Status: DC

## 2019-10-03 MED ORDER — PRAVASTATIN SODIUM 20 MG PO TABS: 20.0000 mg | ORAL_TABLET | Freq: Every day | ORAL | 1 refills | Status: DC

## 2019-10-03 NOTE — Progress Notes (Signed)
Established Patient Office Visit  Subjective:  Patient ID: Allison Mcintyre, female    DOB: 1960-06-22  Age: 59 y.o. MRN: [id number]  CC:  Chief Complaint  Patient presents with  . Hypertension  . Knee Pain    HPI [name] presents for management of hypertension and complaints of knee pain. Denies shortness of breath, headaches, chest pain or lower extremity edema, sudden onset, vision changes, unilateral weakness, dizziness, paresthesias Past Medical History:  Diagnosis Date  . Anemia   . Hemorrhoids   . Hyperlipidemia   . Hypertension   . Menorrhagia     Past Surgical History:  Procedure Laterality Date  . ABDOMINAL HYSTERECTOMY  2003  . CESAREAN SECTION    . COLONOSCOPY    . POLYPECTOMY      Family History  Problem Relation Age of Onset  . Cancer Mother   . Pancreatic cancer Mother   . Bronchitis Father   . Colon polyps Neg Hx   . Esophageal cancer Neg Hx   . Stomach cancer Neg Hx   . Rectal cancer Neg Hx   . Colon cancer Neg Hx     Social History   Socioeconomic History  . Marital status: Single    Spouse name: Not on file  . Number of children: Not on file  . Years of education: Not on file  . Highest education level: Not on file  Occupational History  . Not on file  Tobacco Use  . Smoking status: Current Every Day Smoker    Packs/day: 0.25    Types: Cigarettes  . Smokeless tobacco: Never Used  Substance and Sexual Activity  . Alcohol use: Yes    Alcohol/week: 3.0 standard drinks    Types: 3 Cans of beer per week    Comment: occasional , about weekly  . Drug use: No  . Sexual activity: Yes    Birth control/protection: Surgical  Other Topics Concern  . Not on file  Social History Narrative  . Not on file   Social Determinants of Health   Financial Resource Strain:   . Difficulty of Paying Living Expenses:   Food Insecurity:   . Worried About Charity fundraiser in the Last Year:   . Arboriculturist in the Last Year:    Transportation Needs:   . Film/video editor (Medical):   Marland Kitchen Lack of Transportation (Non-Medical):   Physical Activity:   . Days of Exercise per Week:   . Minutes of Exercise per Session:   Stress:   . Feeling of Stress :   Social Connections:   . Frequency of Communication with Friends and Family:   . Frequency of Social Gatherings with Friends and Family:   . Attends Religious Services:   . Active Member of Clubs or Organizations:   . Attends Archivist Meetings:   Marland Kitchen Marital Status:   Intimate Partner Violence:   . Fear of Current or Ex-Partner:   . Emotionally Abused:   Marland Kitchen Physically Abused:   . Sexually Abused:     Outpatient Medications Prior to Visit  Medication Sig Dispense Refill  . albuterol (VENTOLIN HFA) 108 (90 Base) MCG/ACT inhaler INHALE 1 TO 2 PUFFS BY MOUTH EVERY 6 HOURS AS NEEDED FOR WHEEZING FOR SHORTNESS OF BREATH 9 g 0  . amLODipine (NORVASC) 5 MG tablet Take 1 tablet (5 mg total) by mouth daily. 90 tablet 1  . hydrochlorothiazide (HYDRODIURIL) 25 MG tablet TAKE 1 TABLET BY MOUTH  DAILY IN THE MORNING 90 tablet 1  . ibuprofen (ADVIL) 600 MG tablet Take 1 tablet (600 mg total) by mouth every 8 (eight) hours as needed. 60 tablet 1  . pravastatin (PRAVACHOL) 20 MG tablet Take 1 tablet by mouth once daily 90 tablet 1   No facility-administered medications prior to visit.    No Known Allergies  ROS Review of Systems  Musculoskeletal:       Bilateral knee pain  All other systems reviewed and are negative.     Objective:    Physical Exam  Constitutional: She is oriented to person, place, and time. She appears well-developed and well-nourished.  HENT:  Head: Normocephalic.  Eyes: Pupils are equal, round, and reactive to light. EOM are normal.  Cardiovascular: Normal rate and regular rhythm.  Pulmonary/Chest: Effort normal and breath sounds normal.  Abdominal: Bowel sounds are normal.  Musculoskeletal:        General: Normal range of  motion.     Cervical back: Normal range of motion and neck supple.  Neurological: She is oriented to person, place, and time.  Skin: Skin is warm and dry.  Psychiatric: She has a normal mood and affect. Her behavior is normal. Judgment and thought content normal.    BP 125/72 (BP Location: Left Arm, Patient Position: Sitting, Cuff Size: Large)   Pulse 73   Temp (!) 97.3 F (36.3 C) (Temporal)   Ht 5' 5.5" (1.664 m)   Wt 256 lb 12.8 oz (116.5 kg)   SpO2 98%   BMI 42.08 kg/m  Wt Readings from Last 3 Encounters:  10/03/19 256 lb 12.8 oz (116.5 kg)  07/10/19 258 lb (117 kg)  05/30/19 258 lb (117 kg)     Health Maintenance Due  Topic Date Due  . COVID-19 Vaccine (1) Never done  . Fecal DNA (Cologuard)  Never done    There are no preventive care reminders to display for this patient.  Lab Results  Component Value Date   TSH 1.160 02/22/2018   Lab Results  Component Value Date   WBC 6.5 12/29/2018   HGB 12.3 12/29/2018   HCT 37.3 12/29/2018   MCV 95 12/29/2018   PLT 351 12/29/2018   Lab Results  Component Value Date   NA 139 12/29/2018   K 3.7 12/29/2018   CO2 21 12/29/2018   GLUCOSE 125 (H) 12/29/2018   BUN 17 12/29/2018   CREATININE 0.73 12/29/2018   BILITOT 0.3 12/29/2018   ALKPHOS 68 12/29/2018   AST 12 12/29/2018   ALT 15 12/29/2018   PROT 6.9 12/29/2018   ALBUMIN 4.1 12/29/2018   CALCIUM 9.9 12/29/2018   ANIONGAP 12 08/06/2017   GFR 89.00 03/14/2015   Lab Results  Component Value Date   CHOL 154 12/29/2018   Lab Results  Component Value Date   HDL 57 12/29/2018   Lab Results  Component Value Date   LDLCALC 70 12/29/2018   Lab Results  Component Value Date   TRIG 137 12/29/2018   Lab Results  Component Value Date   CHOLHDL 2.7 12/29/2018   No results found for: HGBA1C    Assessment & Plan:  Ilze was seen today for hypertension and knee pain.  Diagnoses and all orders for this visit:  Chronic pain of both knees Work on losing  weight to help reduce joint pain. May alternate with heat and ice application for pain relief. May also alternate with acetaminophen and Ibuprofen 800 mg 3 times a day as needed prescribed  for pain relief. Other alternatives include massage, acupuncture and water aerobics.  You must stay active and avoid a sedentary lifestyle. -     ibuprofen (ADVIL) 800 MG tablet; Take 1 tablet (800 mg total) by mouth 3 (three) times daily.  Hypertension, unspecified type Blood pressure is at goal less than 130/80 today's blood pressure is 125/72 managed with -     amLODipine (NORVASC) 5 MG tablet; Take 1 tablet (5 mg total) by mouth daily. -     hydrochlorothiazide (HYDRODIURIL) 25 MG tablet; TAKE 1 TABLET BY MOUTH DAILY IN THE MORNING Medications refilled  Medication refill -     albuterol (VENTOLIN HFA) 108 (90 Base) MCG/ACT inhaler; INHALE 1 TO 2 PUFFS BY MOUTH EVERY 6 HOURS AS NEEDED FOR WHEEZING FOR SHORTNESS OF BREATH  Tobacco abuse counseling She is aware of the increased risk for lung cancer and other respiratory diseases recommend cessation.  This will be reminded at each clinical visit.    Other orders -     pravastatin (PRAVACHOL) 20 MG tablet; Take 1 tablet (20 mg total) by mouth daily.    No orders of the defined types were placed in this encounter.   Follow-up: No follow-ups on file.    Kerin Perna, NP

## 2019-10-03 NOTE — Patient Instructions (Signed)

## 2019-10-05 IMAGING — DX DG CHEST 2V
2 series · 2 of 2 positions shown · non-contrast
Comparison: Chest x-ray dated December 08, 2016.

CLINICAL DATA: Cough and right-sided chest pain with deep breathing
for the past week.

EXAM:
CHEST - 2 VIEW

[chest pa]
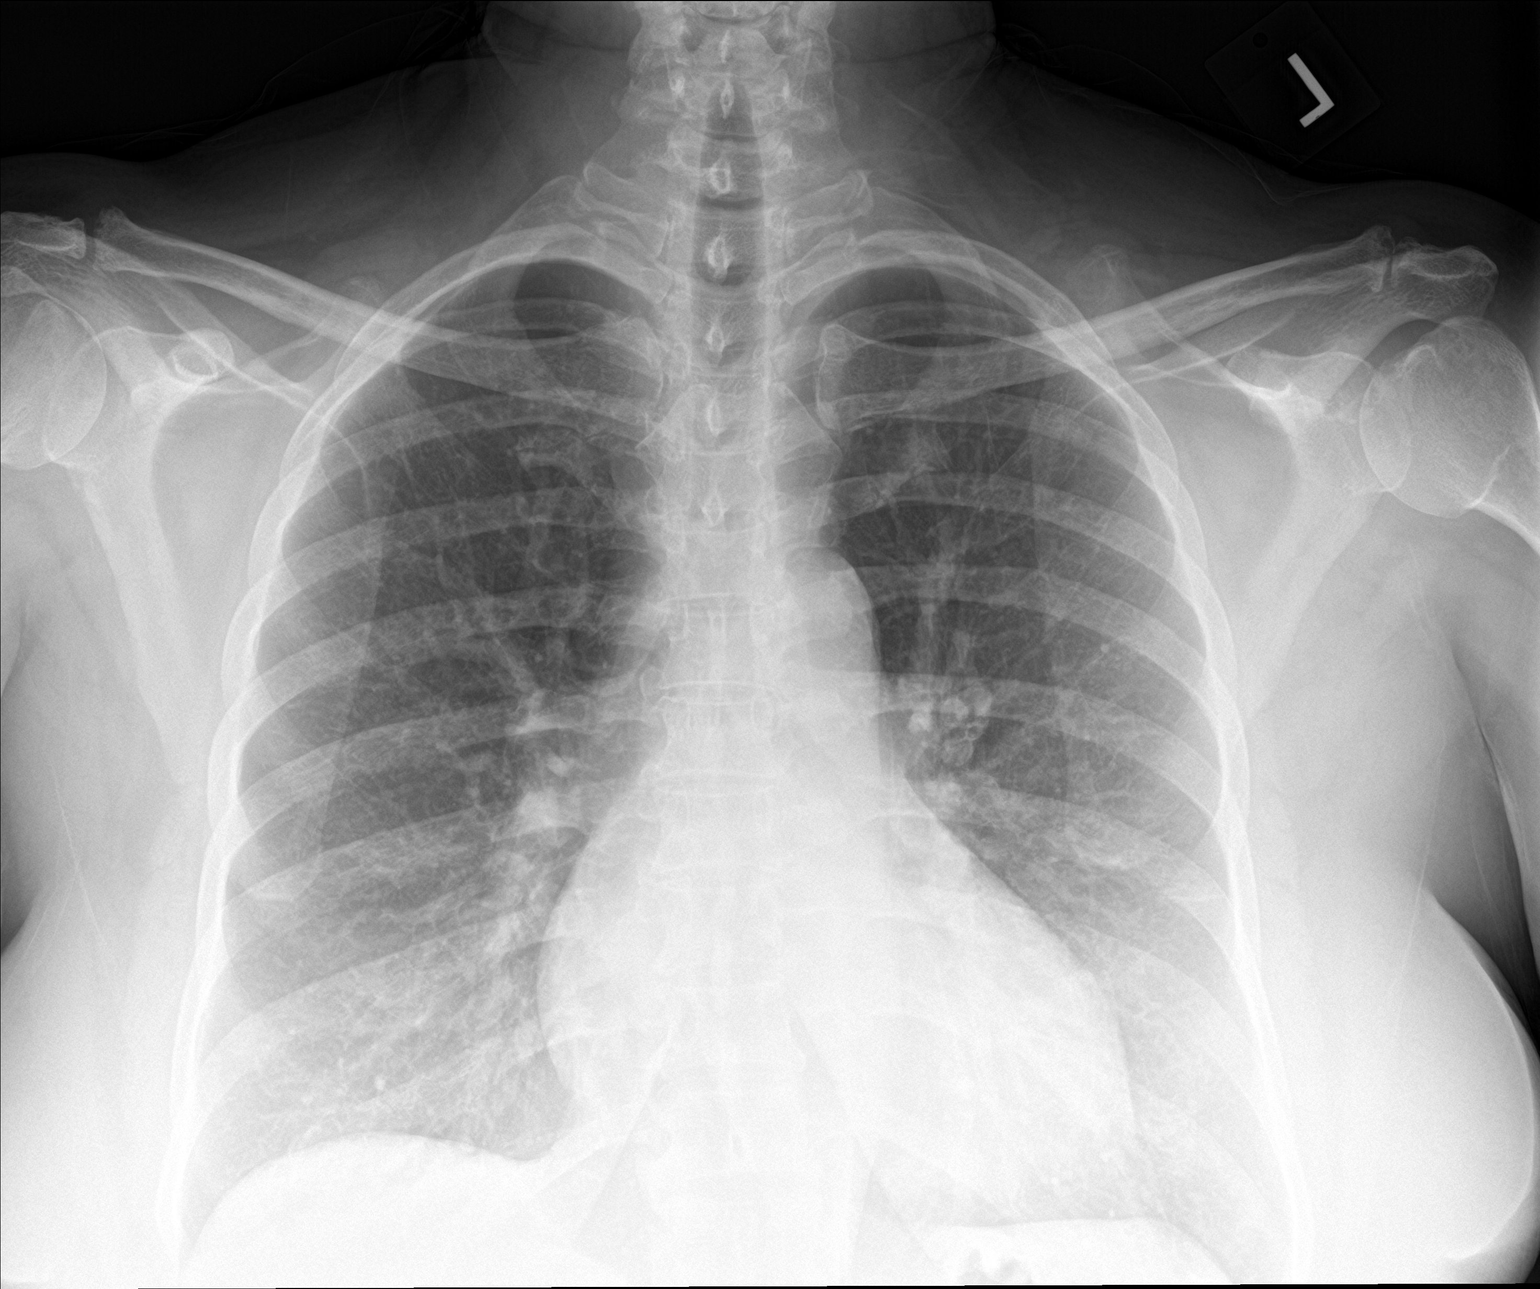

[chest lat]
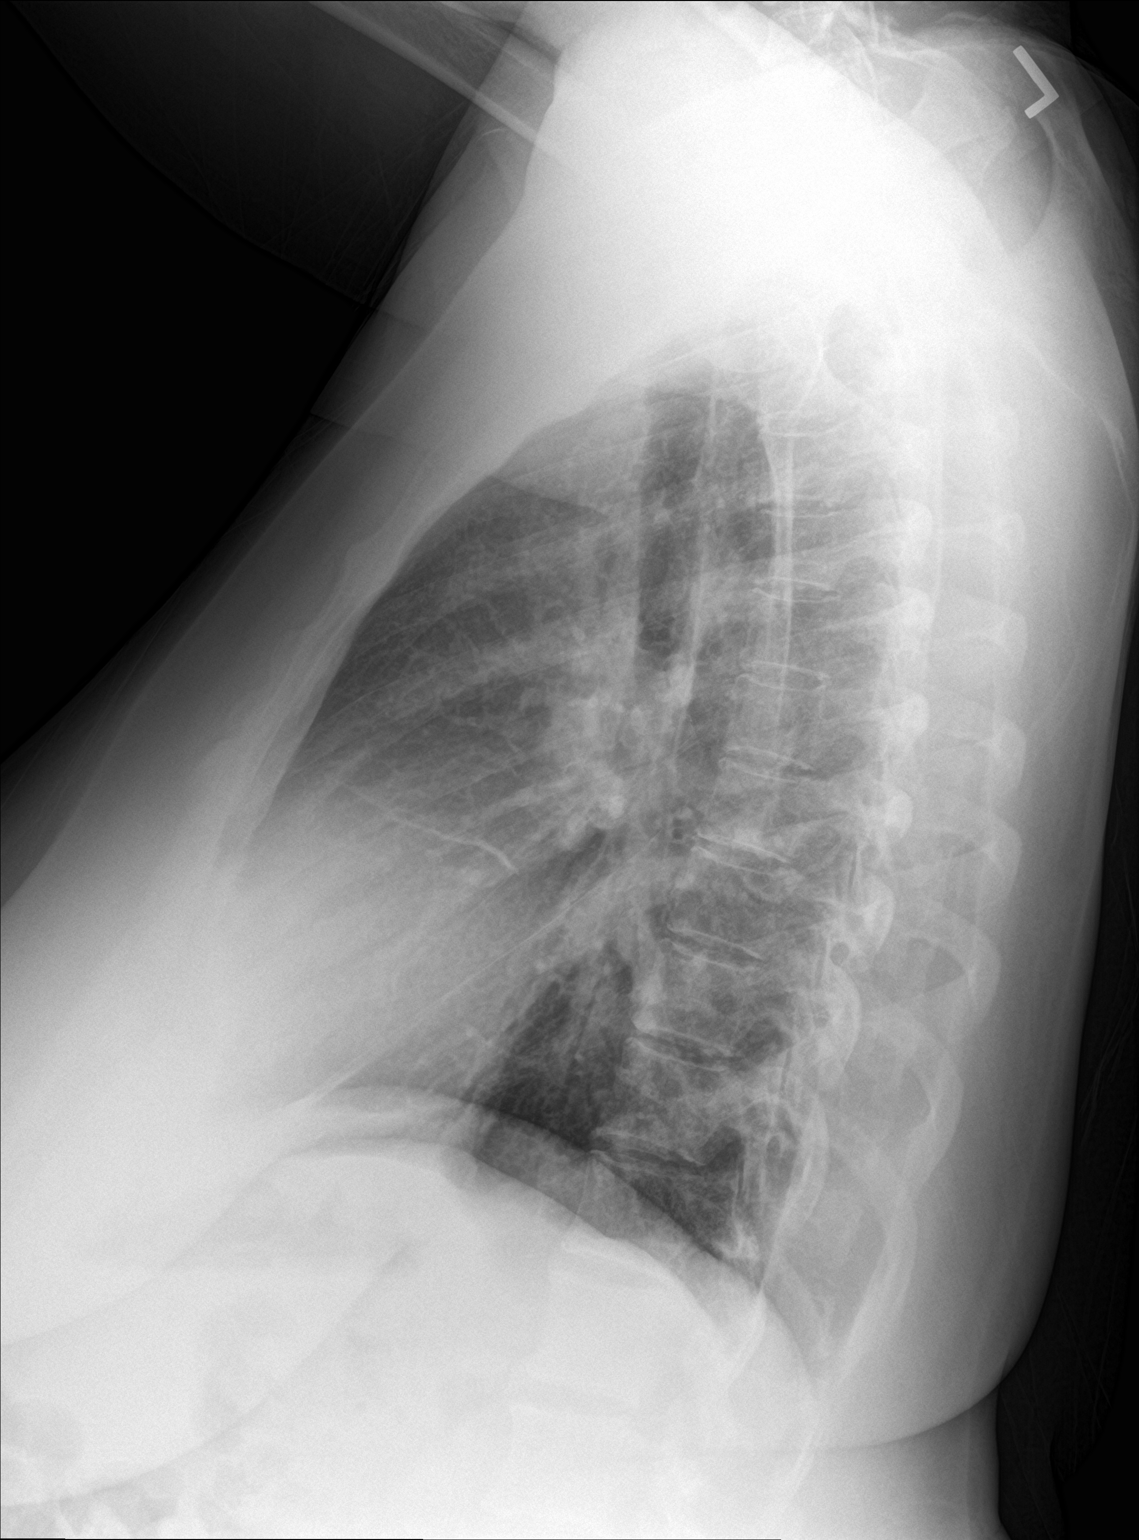

[2 of 2 positions shown; findings below may reference images not displayed]

FINDINGS: The heart size and mediastinal contours are within normal limits.
Both lungs are clear. The visualized skeletal structures are
unremarkable.
IMPRESSION: No active cardiopulmonary disease.

## 2019-12-29 ENCOUNTER — Other Ambulatory Visit (INDEPENDENT_AMBULATORY_CARE_PROVIDER_SITE_OTHER): Payer: Self-pay | Admitting: Primary Care

## 2019-12-29 DIAGNOSIS — Z760 Encounter for issue of repeat prescription: Secondary | ICD-10-CM

## 2019-12-29 DIAGNOSIS — G8929 Other chronic pain: Secondary | ICD-10-CM

## 2019-12-29 DIAGNOSIS — I10 Essential (primary) hypertension: Secondary | ICD-10-CM

## 2019-12-29 MED ORDER — AMLODIPINE BESYLATE 5 MG PO TABS: 5.0000 mg | ORAL_TABLET | Freq: Every day | ORAL | 1 refills | Status: DC

## 2019-12-29 MED ORDER — ALBUTEROL SULFATE HFA 108 (90 BASE) MCG/ACT IN AERS: INHALATION_SPRAY | RESPIRATORY_TRACT | 2 refills | Status: DC

## 2019-12-29 MED ORDER — HYDROCHLOROTHIAZIDE 25 MG PO TABS: ORAL_TABLET | ORAL | 1 refills | Status: DC

## 2019-12-29 MED ORDER — PRAVASTATIN SODIUM 20 MG PO TABS: 20.0000 mg | ORAL_TABLET | Freq: Every day | ORAL | 1 refills | Status: DC

## 2019-12-29 NOTE — Telephone Encounter (Signed)
Medication Refill - Medication: albuterol (VENTOLIN HFA) 108 (90 Base) MCG/ACT inhaler, amLODipine (NORVASC) 5 MG table,hydrochlorothiazide (HYDRODIURIL) 25 MG tablet ,ibuprofen (ADVIL) 800 MG tablet,pravastatin (PRAVACHOL) 20 MG tablet   Has the patient contacted their pharmacy?yes (Agent: If no, request that the patient contact the pharmacy for the refill.) (Agent: If yes, when and what did the pharmacy advise?)Contact PCP  Preferred Pharmacy (with phone number or street name): [facility] [phone]  Agent: Please be advised that RX refills may take up to 3 business days. We ask that you follow-up with your pharmacy.

## 2019-12-29 NOTE — Addendum Note (Signed)
Addended by: Jefferson Fuel on: 12/29/2019 01:01 PM   Modules accepted: Orders

## 2019-12-29 NOTE — Addendum Note (Signed)
Addended by: Jefferson Fuel on: 12/29/2019 12:44 PM   Modules accepted: Orders

## 2020-01-01 ENCOUNTER — Encounter (INDEPENDENT_AMBULATORY_CARE_PROVIDER_SITE_OTHER): Payer: Self-pay | Admitting: Primary Care

## 2020-01-01 ENCOUNTER — Other Ambulatory Visit: Payer: Self-pay

## 2020-01-01 ENCOUNTER — Ambulatory Visit (INDEPENDENT_AMBULATORY_CARE_PROVIDER_SITE_OTHER): Payer: BC Managed Care – PPO | Admitting: Primary Care

## 2020-01-01 VITALS — BP 143/74 | HR 64 | Temp 98.4°F | Ht 65.5 in | Wt 264.0 lb

## 2020-01-01 DIAGNOSIS — W57XXXA Bitten or stung by nonvenomous insect and other nonvenomous arthropods, initial encounter: Secondary | ICD-10-CM | POA: Diagnosis not present

## 2020-01-01 DIAGNOSIS — E781 Pure hyperglyceridemia: Secondary | ICD-10-CM

## 2020-01-01 DIAGNOSIS — I10 Essential (primary) hypertension: Secondary | ICD-10-CM

## 2020-01-01 NOTE — Patient Instructions (Signed)

## 2020-01-01 NOTE — Progress Notes (Signed)
Acute Office Visit  Subjective:    Patient ID: Allison Mcintyre, female    DOB: 12-11-1960, 60 y.o.   MRN: [id number]  Chief Complaint  Patient presents with  . tick bite    Pt. stated she had a tick bite on left foot and she removed the tick out of her left foot. Pt. stated now it gave her a big blister.     HPI [name] is in today for a tick bite on her left foot she removed it with the head on it but now has a blister present. Displays no symptoms   Past Medical History:  Diagnosis Date  . Anemia   . Hemorrhoids   . Hyperlipidemia   . Hypertension   . Menorrhagia     Past Surgical History:  Procedure Laterality Date  . ABDOMINAL HYSTERECTOMY  2003  . CESAREAN SECTION    . COLONOSCOPY    . POLYPECTOMY      Family History  Problem Relation Age of Onset  . Cancer Mother   . Pancreatic cancer Mother   . Bronchitis Father   . Colon polyps Neg Hx   . Esophageal cancer Neg Hx   . Stomach cancer Neg Hx   . Rectal cancer Neg Hx   . Colon cancer Neg Hx     Social History   Socioeconomic History  . Marital status: Single    Spouse name: Not on file  . Number of children: Not on file  . Years of education: Not on file  . Highest education level: Not on file  Occupational History  . Not on file  Tobacco Use  . Smoking status: Current Every Day Smoker    Packs/day: 0.25    Types: Cigarettes  . Smokeless tobacco: Never Used  Substance and Sexual Activity  . Alcohol use: Yes    Alcohol/week: 3.0 standard drinks    Types: 3 Cans of beer per week    Comment: occasional , about weekly  . Drug use: No  . Sexual activity: Yes    Birth control/protection: Surgical  Other Topics Concern  . Not on file  Social History Narrative  . Not on file   Social Determinants of Health   Financial Resource Strain:   . Difficulty of Paying Living Expenses:   Food Insecurity:   . Worried About Charity fundraiser in the Last Year:   . Arboriculturist in  the Last Year:   Transportation Needs:   . Film/video editor (Medical):   Marland Kitchen Lack of Transportation (Non-Medical):   Physical Activity:   . Days of Exercise per Week:   . Minutes of Exercise per Session:   Stress:   . Feeling of Stress :   Social Connections:   . Frequency of Communication with Friends and Family:   . Frequency of Social Gatherings with Friends and Family:   . Attends Religious Services:   . Active Member of Clubs or Organizations:   . Attends Archivist Meetings:   Marland Kitchen Marital Status:   Intimate Partner Violence:   . Fear of Current or Ex-Partner:   . Emotionally Abused:   Marland Kitchen Physically Abused:   . Sexually Abused:     Outpatient Medications Prior to Visit  Medication Sig Dispense Refill  . albuterol (VENTOLIN HFA) 108 (90 Base) MCG/ACT inhaler INHALE 1 TO 2 PUFFS BY MOUTH EVERY 6 HOURS AS NEEDED FOR WHEEZING FOR SHORTNESS OF BREATH 8 g 2  .  amLODipine (NORVASC) 5 MG tablet Take 1 tablet (5 mg total) by mouth daily. 90 tablet 1  . hydrochlorothiazide (HYDRODIURIL) 25 MG tablet TAKE 1 TABLET BY MOUTH DAILY IN THE MORNING 90 tablet 1  . ibuprofen (ADVIL) 800 MG tablet Take 1 tablet (800 mg total) by mouth 3 (three) times daily. 90 tablet 1  . pravastatin (PRAVACHOL) 20 MG tablet Take 1 tablet (20 mg total) by mouth daily. 90 tablet 1   No facility-administered medications prior to visit.    No Known Allergies  Review of Systems  All other systems reviewed and are negative.      Objective:    Physical Exam Vitals reviewed.  Constitutional:      Appearance: She is obese.  Cardiovascular:     Rate and Rhythm: Normal rate and regular rhythm.  Pulmonary:     Effort: Pulmonary effort is normal.     Breath sounds: Normal breath sounds.  Abdominal:     General: Bowel sounds are normal.     Palpations: Abdomen is soft.  Musculoskeletal:        General: Normal range of motion.     Cervical back: Normal range of motion.  Skin:    General:  Skin is warm and dry.  Neurological:     Mental Status: She is alert and oriented to person, place, and time.  Psychiatric:        Mood and Affect: Mood normal.        Behavior: Behavior normal.        Thought Content: Thought content normal.        Judgment: Judgment normal.     BP (!) 143/74 (BP Location: Left Arm, Patient Position: Sitting, Cuff Size: Normal)   Pulse 64   Temp 98.4 F (36.9 C) (Oral)   Ht 5' 5.5" (1.664 m)   Wt 264 lb (119.7 kg)   SpO2 95%   BMI 43.26 kg/m  Wt Readings from Last 3 Encounters:  01/01/20 264 lb (119.7 kg)  10/03/19 256 lb 12.8 oz (116.5 kg)  07/10/19 258 lb (117 kg)    Health Maintenance Due  Topic Date Due  . COVID-19 Vaccine (1) Never done  . Fecal DNA (Cologuard)  Never done    There are no preventive care reminders to display for this patient.   Lab Results  Component Value Date   TSH 1.160 02/22/2018   Lab Results  Component Value Date   WBC 6.5 12/29/2018   HGB 12.3 12/29/2018   HCT 37.3 12/29/2018   MCV 95 12/29/2018   PLT 351 12/29/2018   Lab Results  Component Value Date   NA 139 12/29/2018   K 3.7 12/29/2018   CO2 21 12/29/2018   GLUCOSE 125 (H) 12/29/2018   BUN 17 12/29/2018   CREATININE 0.73 12/29/2018   BILITOT 0.3 12/29/2018   ALKPHOS 68 12/29/2018   AST 12 12/29/2018   ALT 15 12/29/2018   PROT 6.9 12/29/2018   ALBUMIN 4.1 12/29/2018   CALCIUM 9.9 12/29/2018   ANIONGAP 12 08/06/2017   GFR 89.00 03/14/2015   Lab Results  Component Value Date   CHOL 154 12/29/2018   Lab Results  Component Value Date   HDL 57 12/29/2018   Lab Results  Component Value Date   LDLCALC 70 12/29/2018   Lab Results  Component Value Date   TRIG 137 12/29/2018   Lab Results  Component Value Date   CHOLHDL 2.7 12/29/2018   No results found for: HGBA1C  Assessment & Plan:   Allison Mcintyre was seen today for tick bite.  Diagnoses and all orders for this visit:  Tick bite, initial encounter   Patient  removed the head from tick. Consulted with Infectious disease as long as asymptomatic no abt needed treat with warm press and do not burst. Instruction given on AVS for s/s  Hypertension, unspecified type Noncompliant with diet Bp elevated ate pig feet and pig tails. Endorses takes medication daily may not eat Counseled on blood pressure goal of less than 130/80, low-sodium, DASH diet, medication compliance, 150 minutes of moderate intensity exercise per week. Discussed medication compliance, adverse effects.  No orders of the defined types were placed in this encounter.    Kerin Perna, NP

## 2020-01-05 ENCOUNTER — Ambulatory Visit (INDEPENDENT_AMBULATORY_CARE_PROVIDER_SITE_OTHER): Payer: BC Managed Care – PPO | Admitting: Primary Care

## 2020-01-09 ENCOUNTER — Ambulatory Visit: Payer: BC Managed Care – PPO | Attending: Family

## 2020-01-09 DIAGNOSIS — Z23 Encounter for immunization: Secondary | ICD-10-CM

## 2020-01-09 NOTE — Progress Notes (Signed)
   Covid-19 Vaccination Clinic  Name:  Allison Mcintyre    MRN: [id number] DOB: 04/19/1961  01/09/2020  [name] was observed post Covid-19 immunization for 15 minutes without incident. She was provided with Vaccine Information Sheet and instruction to access the V-Safe system.   [name] was instructed to call 911 with any severe reactions post vaccine: Marland Kitchen Difficulty breathing  . Swelling of face and throat  . A fast heartbeat  . A bad rash all over body  . Dizziness and weakness   Immunizations Administered    Name Date Dose VIS Date Route   Pfizer COVID-19 Vaccine 01/09/2020  1:24 PM 0.3 mL 08/09/2018 Intramuscular   Manufacturer: [place]   Lot: [id number]   [place]: [id number]

## 2020-01-15 ENCOUNTER — Other Ambulatory Visit (INDEPENDENT_AMBULATORY_CARE_PROVIDER_SITE_OTHER): Payer: Self-pay | Admitting: Primary Care

## 2020-01-15 DIAGNOSIS — M25562 Pain in left knee: Secondary | ICD-10-CM

## 2020-01-15 DIAGNOSIS — G8929 Other chronic pain: Secondary | ICD-10-CM

## 2020-01-15 DIAGNOSIS — I10 Essential (primary) hypertension: Secondary | ICD-10-CM

## 2020-01-15 NOTE — Telephone Encounter (Signed)
Requested medication (s) are due for refill today:  yes  Requested medication (s) are on the active medication list:  Yes  Future visit scheduled:  Yes  Last Refill: 10/03/19; #90; refill x 1  Note to Clinic:  Failed protocol since lab work is outdated; unsure if Juluis Mire wants pt. To continue this higher dose of Ibuprofen.  Please advise.   Requested Prescriptions  Pending Prescriptions Disp Refills   ibuprofen (ADVIL) 800 MG tablet [Pharmacy Med Name: Ibuprofen 800 MG Oral Tablet] 90 tablet 0    Sig: TAKE 1 TABLET BY MOUTH THREE TIMES DAILY      Analgesics:  NSAIDS Failed - 01/15/2020  3:45 PM      Failed - Cr in normal range and within 360 days    Creatinine, Ser  Date Value Ref Range Status  12/29/2018 0.73 0.57 - 1.00 mg/dL Final          Failed - HGB in normal range and within 360 days    Hemoglobin  Date Value Ref Range Status  12/29/2018 12.3 11.1 - 15.9 g/dL Final          Passed - Patient is not pregnant      Passed - Valid encounter within last 12 months    Recent Outpatient Visits           2 weeks ago Tick bite, initial encounter   [place], Michelle P, NP   3 months ago Chronic pain of both knees   [place], Michelle P, NP   9 months ago Hypertension, unspecified type   [place] Juluis Mire P, NP   10 months ago Hypertension, unspecified type   [place] Juluis Mire P, NP   12 months ago Abdominal pain, right upper quadrant   [place], Michelle P, NP       Future Appointments             In 5 months [place], Milford Cage, NP [facility]             Refused Prescriptions Disp Refills   hydrochlorothiazide (HYDRODIURIL) 25 MG tablet [Pharmacy Med Name: hydroCHLOROthiazide 25 MG Oral Tablet] 90 tablet 0    Sig: TAKE 1 TABLET BY MOUTH ONCE DAILY IN THE  MORNING      Cardiovascular: Diuretics - Thiazide Failed - 01/15/2020  3:45 PM      Failed - Ca in normal range and within 360 days    Calcium  Date Value Ref Range Status  12/29/2018 9.9 8.7 - 10.2 mg/dL Final   Calcium, Ion  Date Value Ref Range Status  08/06/2017 1.18 1.15 - 1.40 mmol/L Final          Failed - Cr in normal range and within 360 days    Creatinine, Ser  Date Value Ref Range Status  12/29/2018 0.73 0.57 - 1.00 mg/dL Final          Failed - K in normal range and within 360 days    Potassium  Date Value Ref Range Status  12/29/2018 3.7 3.5 - 5.2 mmol/L Final          Failed - Na in normal range and within 360 days    Sodium  Date Value Ref Range Status  12/29/2018 139 134 - 144 mmol/L Final          Failed -  Last BP in normal range    BP Readings from Last 1 Encounters:  01/01/20 (!) 143/74          Passed - Valid encounter within last 6 months    Recent Outpatient Visits           2 weeks ago Tick bite, initial encounter   [place], Michelle P, NP   3 months ago Chronic pain of both knees   [place] Juluis Mire P, NP   9 months ago Hypertension, unspecified type   [place] Juluis Mire P, NP   10 months ago Hypertension, unspecified type   [place] Juluis Mire P, NP   12 months ago Abdominal pain, right upper quadrant   [place], [place], NP       Future Appointments             In 5 months [place], Milford Cage, NP [facility]              amLODipine ([place]) 5 MG tablet [Pharmacy Med Name: amLODIPine [place] 5 MG Oral Tablet] 90 tablet 0    Sig: Take 1 tablet by mouth once daily      Cardiovascular:  Calcium Channel Blockers Failed - 01/15/2020  3:45 PM      Failed - Last BP in normal range    BP Readings from Last 1 Encounters:  01/01/20 (!)  143/74          Passed - Valid encounter within last 6 months    Recent Outpatient Visits           2 weeks ago Tick bite, initial encounter   [place], Michelle P, NP   3 months ago Chronic pain of both knees   [place] Kerin Perna, NP   9 months ago Hypertension, unspecified type   [place] Kerin Perna, NP   10 months ago Hypertension, unspecified type   [place] Juluis Mire P, NP   12 months ago Abdominal pain, right upper quadrant   [place] Kerin Perna, NP       Future Appointments             In 5 months Oletta Lamas, Milford Cage, NP [place]

## 2020-01-15 NOTE — Telephone Encounter (Signed)
Requested Prescriptions  Pending Prescriptions Disp Refills   ibuprofen (ADVIL) 800 MG tablet [Pharmacy Med Name: Ibuprofen 800 MG Oral Tablet] 90 tablet 0    Sig: TAKE 1 TABLET BY MOUTH THREE TIMES DAILY     Analgesics:  NSAIDS Failed - 01/15/2020  3:45 PM      Failed - Cr in normal range and within 360 days    Creatinine, Ser  Date Value Ref Range Status  12/29/2018 0.73 0.57 - 1.00 mg/dL Final         Failed - HGB in normal range and within 360 days    Hemoglobin  Date Value Ref Range Status  12/29/2018 12.3 11.1 - 15.9 g/dL Final         Passed - Patient is not pregnant      Passed - Valid encounter within last 12 months    Recent Outpatient Visits          2 weeks ago Tick bite, initial encounter   [place], Michelle P, NP   3 months ago Chronic pain of both knees   [place], Michelle P, NP   9 months ago Hypertension, unspecified type   [place] Juluis Mire P, NP   10 months ago Hypertension, unspecified type   [place] Juluis Mire P, NP   12 months ago Abdominal pain, right upper quadrant   [place], Michelle P, NP      Future Appointments            In 5 months [place], Milford Cage, NP [facility]           Refused Prescriptions Disp Refills   hydrochlorothiazide (HYDRODIURIL) 25 MG tablet [Pharmacy Med Name: hydroCHLOROthiazide 25 MG Oral Tablet] 90 tablet 0    Sig: TAKE 1 TABLET BY MOUTH ONCE DAILY IN THE MORNING     Cardiovascular: Diuretics - Thiazide Failed - 01/15/2020  3:45 PM      Failed - Ca in normal range and within 360 days    Calcium  Date Value Ref Range Status  12/29/2018 9.9 8.7 - 10.2 mg/dL Final   Calcium, Ion  Date Value Ref Range Status  08/06/2017 1.18 1.15 - 1.40 mmol/L Final         Failed - Cr in normal range and within 360 days     Creatinine, Ser  Date Value Ref Range Status  12/29/2018 0.73 0.57 - 1.00 mg/dL Final         Failed - K in normal range and within 360 days    Potassium  Date Value Ref Range Status  12/29/2018 3.7 3.5 - 5.2 mmol/L Final         Failed - Na in normal range and within 360 days    Sodium  Date Value Ref Range Status  12/29/2018 139 134 - 144 mmol/L Final         Failed - Last BP in normal range    BP Readings from Last 1 Encounters:  01/01/20 (!) 143/74         Passed - Valid encounter within last 6 months    Recent Outpatient Visits          2 weeks ago Tick bite, initial encounter   [place], Michelle P, NP   3 months ago Chronic pain of both knees  [facility] Juluis Mire P, NP   9 months ago Hypertension, unspecified type   [place] Juluis Mire P, NP   10 months ago Hypertension, unspecified type   [place] Kerin Perna, NP   12 months ago Abdominal pain, right upper quadrant   [place], [place], NP      Future Appointments            In 5 months [place], Milford Cage, NP [facility]            amLODipine ([place]) 5 MG tablet [Pharmacy Med Name: amLODIPine Besylate 5 MG Oral Tablet] 90 tablet 0    Sig: Take 1 tablet by mouth once daily     Cardiovascular:  Calcium Channel Blockers Failed - 01/15/2020  3:45 PM      Failed - Last BP in normal range    BP Readings from Last 1 Encounters:  01/01/20 (!) 143/74         Passed - Valid encounter within last 6 months    Recent Outpatient Visits          2 weeks ago Tick bite, initial encounter   [place], Michelle P, NP   3 months ago Chronic pain of both knees   [place] Kerin Perna, NP   9 months ago Hypertension, unspecified type   [place] Kerin Perna, NP   10 months ago Hypertension, unspecified type   [place] Juluis Mire P, NP   12 months ago Abdominal pain, right upper quadrant   [place] Kerin Perna, NP      Future Appointments            In 5 months Oletta Lamas, Milford Cage, NP [place]

## 2020-01-24 ENCOUNTER — Ambulatory Visit (INDEPENDENT_AMBULATORY_CARE_PROVIDER_SITE_OTHER): Payer: BC Managed Care – PPO | Admitting: Primary Care

## 2020-01-30 ENCOUNTER — Ambulatory Visit: Payer: BC Managed Care – PPO | Attending: Internal Medicine

## 2020-01-30 DIAGNOSIS — Z23 Encounter for immunization: Secondary | ICD-10-CM

## 2020-01-30 NOTE — Progress Notes (Signed)
   Covid-19 Vaccination Clinic  Name:  NAVEYA ELLERMAN    MRN: [id number] DOB: 07-10-1960  01/30/2020  Ms. Klich was observed post Covid-19 immunization for 15 minutes without incident. She was provided with Vaccine Information Sheet and instruction to access the V-Safe system.   Ms. Cygan was instructed to call 911 with any severe reactions post vaccine: Marland Kitchen Difficulty breathing  . Swelling of face and throat  . A fast heartbeat  . A bad rash all over body  . Dizziness and weakness   Immunizations Administered    Name Date Dose VIS Date Route   Pfizer COVID-19 Vaccine 01/30/2020 12:44 PM 0.3 mL 08/09/2018 Intramuscular   Manufacturer: [place]   Lot: [id number]   [place]: [id number]

## 2020-02-07 ENCOUNTER — Ambulatory Visit (INDEPENDENT_AMBULATORY_CARE_PROVIDER_SITE_OTHER): Payer: BC Managed Care – PPO | Admitting: Primary Care

## 2020-02-07 ENCOUNTER — Encounter (INDEPENDENT_AMBULATORY_CARE_PROVIDER_SITE_OTHER): Payer: Self-pay | Admitting: Primary Care

## 2020-02-07 ENCOUNTER — Other Ambulatory Visit: Payer: Self-pay

## 2020-02-07 DIAGNOSIS — Z716 Tobacco abuse counseling: Secondary | ICD-10-CM | POA: Diagnosis not present

## 2020-02-07 DIAGNOSIS — J069 Acute upper respiratory infection, unspecified: Secondary | ICD-10-CM | POA: Diagnosis not present

## 2020-02-07 MED ORDER — GUAIFENESIN 100 MG/5ML PO SOLN
5.0000 mL | ORAL | 0 refills | Status: DC | PRN
Start: 2020-02-07 — End: 2021-01-16

## 2020-02-07 MED ORDER — HYDROCOD POLST-CPM POLST ER 10-8 MG/5ML PO SUER: 5.0000 mL | Freq: Every evening | ORAL | 0 refills | Status: DC | PRN

## 2020-02-07 NOTE — Patient Instructions (Signed)
Guaifenesin oral solution and syrup What is this medicine? GUAIFENESIN (gwye FEN e sin) is an expectorant. It helps to thin mucous and make coughs more productive. This medicine is used to treat coughs caused by colds or the flu. It is not intended to treat chronic cough caused by smoking, asthma, emphysema, or heart failure. This medicine may be used for other purposes; ask your health care provider or pharmacist if you have questions. COMMON BRAND NAME(S): Altarussin, Altorant, Chest Congestion Relief, Cough, Diabetic Tussin, Diabetic Tussin EX, Diabetic Tussin Mucus Relief, ElixSure EX, Ganidin NR, GERI-TUSSIN, Guiatuss, Iophen-NR, Miltuss EX, Mucinex Children's, Mucinex Fast-Max Chest Congestion, Mucus + Chest Congestion, Mucus Relief Children's, Naldecon, Organidin NR, Q-Tussin, Robafen, Robafen Congestion, Robitussin, Robitussin Mucus + Chest Congestion, Scot-Tussin Expectorant, Siltussin DAS, Siltussin Diabetic DAS-Na, Siltussin SA What should I tell my health care provider before I take this medicine? They need to know if you have any of these conditions:  diabetes  fever  kidney disease  an unusual or allergic reaction to guaifenesin, other medicines, foods, dyes, or preservatives  pregnant or trying to get pregnant  breast-feeding How should I use this medicine? Take this medicine by mouth. Follow the directions on the prescription label. Use a specially marked spoon or container to measure your dose. Household spoons are not accurate. Take your medicine at regular intervals. Do not take it more often than directed. Talk to your pediatrician regarding the use of this medicine in children. Special care may be needed. Overdosage: If you think you have taken too much of this medicine contact a poison control center or emergency room at once. NOTE: This medicine is only for you. Do not share this medicine with others. What if I miss a dose? If you miss a dose, take it as soon as you  can. If it is almost time for your next dose, take only that dose. Do not take double or extra doses. What may interact with this medicine? Interactions are not expected. This list may not describe all possible interactions. Give your health care provider a list of all the medicines, herbs, non-prescription drugs, or dietary supplements you use. Also tell them if you smoke, drink alcohol, or use illegal drugs. Some items may interact with your medicine. What should I watch for while using this medicine? Do not treat a cough for more than 1 week without consulting your doctor or health care professional. If you also have a high fever, skin rash, continuing headache, or sore throat, see your doctor. For best results, drink 6 to 8 glasses water daily while you are taking this medicine. What side effects may I notice from receiving this medicine? Side effects that you should report to your doctor or health care professional as soon as possible:  allergic reactions like skin rash, itching or hives, swelling of the face, lips, or tongue Side effects that usually do not require medical attention (report to your doctor or health care professional if they continue or are bothersome):  dizziness  headache  stomach upset This list may not describe all possible side effects. Call your doctor for medical advice about side effects. You may report side effects to FDA at 1-800-FDA-1088. Where should I keep my medicine? Keep out of the reach of children. Store at room temperature between 20 and 25 degrees C (68 and 77 degrees F). Do not freeze. Keep container tightly closed. Throw away any unused medicine after the expiration date. NOTE: This sheet is a summary. It may not  cover all possible information. If you have questions about this medicine, talk to your doctor, pharmacist, or health care provider.  2020 Elsevier/Gold Standard (2007-10-12 11:48:29)

## 2020-02-07 NOTE — Progress Notes (Signed)
Established Patient Office Visit  Subjective:  Patient ID: Allison Mcintyre, female    DOB: 1961/04/23  Age: 59 y.o. MRN: [id number]  CC: Cough and rattling in her chest    HPI [name] presents for cold and congestion with phelm clear now 2 days ago she reported it being yellow. Asked do you continue to smoke YES. She also informed PCP she has completed her COVID vaccine (:))  Past Medical History:  Diagnosis Date  . Anemia   . Hemorrhoids   . Hyperlipidemia   . Hypertension   . Menorrhagia     Past Surgical History:  Procedure Laterality Date  . ABDOMINAL HYSTERECTOMY  2003  . CESAREAN SECTION    . COLONOSCOPY    . POLYPECTOMY      Family History  Problem Relation Age of Onset  . Cancer Mother   . Pancreatic cancer Mother   . Bronchitis Father   . Colon polyps Neg Hx   . Esophageal cancer Neg Hx   . Stomach cancer Neg Hx   . Rectal cancer Neg Hx   . Colon cancer Neg Hx     Social History   Socioeconomic History  . Marital status: Single    Spouse name: Not on file  . Number of children: Not on file  . Years of education: Not on file  . Highest education level: Not on file  Occupational History  . Not on file  Tobacco Use  . Smoking status: Current Every Day Smoker    Packs/day: 0.25    Types: Cigarettes  . Smokeless tobacco: Never Used  Substance and Sexual Activity  . Alcohol use: Yes    Alcohol/week: 3.0 standard drinks    Types: 3 Cans of beer per week    Comment: occasional , about weekly  . Drug use: No  . Sexual activity: Yes    Birth control/protection: Surgical  Other Topics Concern  . Not on file  Social History Narrative  . Not on file   Social Determinants of Health   Financial Resource Strain:   . Difficulty of Paying Living Expenses: Not on file  Food Insecurity:   . Worried About Charity fundraiser in the Last Year: Not on file  . Ran Out of Food in the Last Year: Not on file  Transportation Needs:   .  Lack of Transportation (Medical): Not on file  . Lack of Transportation (Non-Medical): Not on file  Physical Activity:   . Days of Exercise per Week: Not on file  . Minutes of Exercise per Session: Not on file  Stress:   . Feeling of Stress : Not on file  Social Connections:   . Frequency of Communication with Friends and Family: Not on file  . Frequency of Social Gatherings with Friends and Family: Not on file  . Attends Religious Services: Not on file  . Active Member of Clubs or Organizations: Not on file  . Attends Archivist Meetings: Not on file  . Marital Status: Not on file  Intimate Partner Violence:   . Fear of Current or Ex-Partner: Not on file  . Emotionally Abused: Not on file  . Physically Abused: Not on file  . Sexually Abused: Not on file    Outpatient Medications Prior to Visit  Medication Sig Dispense Refill  . albuterol (VENTOLIN HFA) 108 (90 Base) MCG/ACT inhaler INHALE 1 TO 2 PUFFS BY MOUTH EVERY 6 HOURS AS NEEDED FOR WHEEZING FOR SHORTNESS  OF BREATH 8 g 2  . amLODipine (NORVASC) 5 MG tablet Take 1 tablet (5 mg total) by mouth daily. 90 tablet 1  . hydrochlorothiazide (HYDRODIURIL) 25 MG tablet TAKE 1 TABLET BY MOUTH DAILY IN THE MORNING 90 tablet 1  . ibuprofen (ADVIL) 800 MG tablet TAKE 1 TABLET BY MOUTH THREE TIMES DAILY 90 tablet 0  . pravastatin (PRAVACHOL) 20 MG tablet Take 1 tablet (20 mg total) by mouth daily. 90 tablet 1   No facility-administered medications prior to visit.    No Known Allergies  ROS Review of Systems  HENT: Positive for congestion.   Respiratory: Positive for cough and shortness of breath.   All other systems reviewed and are negative.     Objective:    Physical Exam Vitals reviewed.  Constitutional:      Appearance: She is obese.  HENT:     Head: Normocephalic.     Right Ear: Tympanic membrane normal.     Left Ear: Tympanic membrane normal.  Cardiovascular:     Rate and Rhythm: Normal rate and regular  rhythm.     Pulses: Normal pulses.     Heart sounds: Normal heart sounds.  Pulmonary:     Effort: Pulmonary effort is normal.     Breath sounds: Normal breath sounds.  Abdominal:     General: Bowel sounds are normal. There is distension.  Musculoskeletal:        General: Normal range of motion.  Skin:    General: Skin is warm and dry.  Neurological:     Mental Status: She is oriented to person, place, and time.  Psychiatric:        Mood and Affect: Mood normal.        Behavior: Behavior normal.        Thought Content: Thought content normal.        Judgment: Judgment normal.     BP 117/70   Pulse 78   Temp 98.6 F (37 C)   Resp 16   Wt 263 lb 3.2 oz (119.4 kg)   SpO2 97%   BMI 43.13 kg/m  Wt Readings from Last 3 Encounters:  02/07/20 263 lb 3.2 oz (119.4 kg)  01/01/20 264 lb (119.7 kg)  10/03/19 256 lb 12.8 oz (116.5 kg)     Health Maintenance Due  Topic Date Due  . Fecal DNA (Cologuard)  Never done  . INFLUENZA VACCINE  01/14/2020    There are no preventive care reminders to display for this patient.  Lab Results  Component Value Date   TSH 1.160 02/22/2018   Lab Results  Component Value Date   WBC 6.5 12/29/2018   HGB 12.3 12/29/2018   HCT 37.3 12/29/2018   MCV 95 12/29/2018   PLT 351 12/29/2018   Lab Results  Component Value Date   NA 139 12/29/2018   K 3.7 12/29/2018   CO2 21 12/29/2018   GLUCOSE 125 (H) 12/29/2018   BUN 17 12/29/2018   CREATININE 0.73 12/29/2018   BILITOT 0.3 12/29/2018   ALKPHOS 68 12/29/2018   AST 12 12/29/2018   ALT 15 12/29/2018   PROT 6.9 12/29/2018   ALBUMIN 4.1 12/29/2018   CALCIUM 9.9 12/29/2018   ANIONGAP 12 08/06/2017   GFR 89.00 03/14/2015   Lab Results  Component Value Date   CHOL 154 12/29/2018   Lab Results  Component Value Date   HDL 57 12/29/2018   Lab Results  Component Value Date   LDLCALC 70 12/29/2018  Lab Results  Component Value Date   TRIG 137 12/29/2018   Lab Results  Component  Value Date   CHOLHDL 2.7 12/29/2018   No results found for: HGBA1C    Assessment & Plan:  Allison Mcintyre was seen today for knee pain.  Diagnoses and all orders for this visit:  Upper respiratory infection with cough and congestion This happen after taking her second Covid vaccine- consistent coughing unable to rest at night . Advised to treat symptoms guaifenesin expectant and will prescribe tussinex  Fever or chills tylenol or ibuprofen  Tobacco abuse counseling This can cause symptom to become worst. Smoking cessation is recommended . When ready we can explore options   Meds ordered this encounter  Medications  . guaiFENesin (ROBITUSSIN) 100 MG/5ML SOLN    Sig: Take 5 mLs (100 mg total) by mouth every 4 (four) hours as needed for cough or to loosen phlegm.    Dispense:  236 mL    Refill:  0  . chlorpheniramine-HYDROcodone (TUSSIONEX PENNKINETIC ER) 10-8 MG/5ML SUER    Sig: Take 5 mLs by mouth at bedtime as needed for cough.    Dispense:  115 mL    Refill:  0    Follow-up: Return for keep scheduled visit.    Kerin Perna, NP

## 2020-02-12 ENCOUNTER — Other Ambulatory Visit (INDEPENDENT_AMBULATORY_CARE_PROVIDER_SITE_OTHER): Payer: Self-pay | Admitting: Primary Care

## 2020-02-12 NOTE — Telephone Encounter (Signed)
Requested medication (s) are due for refill today:no  Requested medication (s) are on the active medication list: yes  Last refill: 02/07/2020  Future visit scheduled: yes   Notes to clinic:  medication not assigned to protocol, review manually    Requested Prescriptions  Pending Prescriptions Disp Refills   chlorpheniramine-HYDROcodone (TUSSIONEX PENNKINETIC ER) 10-8 MG/5ML SUER 115 mL 0    Sig: Take 5 mLs by mouth at bedtime as needed for cough.      Off-Protocol Failed - 02/12/2020 12:26 PM      Failed - Medication not assigned to a protocol, review manually.      Passed - Valid encounter within last 12 months    Recent Outpatient Visits           5 days ago Upper respiratory infection with cough and congestion   [facility] Kerin Perna, NP   1 month ago Tick bite, initial encounter   [place], Michelle P, NP   4 months ago Chronic pain of both knees   [facility] Kerin Perna, NP   10 months ago Hypertension, unspecified type   [place] Kerin Perna, NP   11 months ago Hypertension, unspecified type   [place] Kerin Perna, NP       Future Appointments             In 4 months Oletta Lamas, Milford Cage, NP [place]

## 2020-02-12 NOTE — Telephone Encounter (Signed)
chlorpheniramine-HYDROcodone ([place] ER) 10-8 MG/5ML SUER  This med was called into [facility] and pt states that they (Pharmacy ) said it would need to be called into [id number] - [place], [place] - [address] Phone:  [phone]  Fax:  [phone]

## 2020-02-14 ENCOUNTER — Other Ambulatory Visit (INDEPENDENT_AMBULATORY_CARE_PROVIDER_SITE_OTHER): Payer: Self-pay | Admitting: Primary Care

## 2020-02-14 ENCOUNTER — Telehealth (INDEPENDENT_AMBULATORY_CARE_PROVIDER_SITE_OTHER): Payer: Self-pay | Admitting: Primary Care

## 2020-02-14 MED ORDER — HYDROCOD POLST-CPM POLST ER 10-8 MG/5ML PO SUER: 5.0000 mL | Freq: Every evening | ORAL | 0 refills | Status: DC | PRN

## 2020-02-14 NOTE — Telephone Encounter (Signed)
Pt states [facility] does not carry the chlorpheniramine-HYDROcodone (TUSSIONEX PENNKINETIC ER) 10-8 MG/5ML SUER  So she has never picked it up.  Requesting to resend to  [id number] - [place], [address] Phone:  [phone]  Fax:  [phone]

## 2020-02-14 NOTE — Telephone Encounter (Signed)
Pt not requesting refill, she never picked this up.  [facility] does not [facility].  Please see other message

## 2020-02-22 ENCOUNTER — Telehealth (INDEPENDENT_AMBULATORY_CARE_PROVIDER_SITE_OTHER): Payer: Self-pay

## 2020-02-22 NOTE — Telephone Encounter (Signed)
Copied from [place] [id number]. Topic: General - Other >> Feb 22, 2020  9:58 AM Celene Kras wrote: Reason for CRM: Pt called stating that she has bite marks on her foot. She states that she is itching x2 days. Pt is requesting to have a nurse give her a call back. Please advise.    Spoke with patient and advised she go to urgent care to have bites examined. As there is no provider in office today and no available appointments for tomorrow. Nat Christen, CMA

## 2020-03-22 ENCOUNTER — Ambulatory Visit: Payer: Self-pay

## 2020-03-22 NOTE — Telephone Encounter (Signed)
Pt. Reports she has had left knee pain x 6 months. Knee is swollen. "Motrin is not helping." Next available appointment made for 04/01/20. Please advise pt. If she can be seen sooner.  Reason for Disposition  [1] MODERATE pain (e.g., interferes with normal activities, limping) AND [2] present > 3 days  Answer Assessment - Initial Assessment Questions 1. LOCATION: "Where is the swelling located?"  (e.g., left, right, both knees)     Left knee 2. SIZE and DESCRIPTION: "What does the swelling look like?"  (e.g., entire knee, localized)     Entire knee 3. ONSET: "When did the swelling start?" "Does it come and go, or is it there all the time?"     6 months 4. PAIN: "Is there any pain?" If Yes, ask: "How bad is it?" (Scale 1-10; or mild, moderate, severe)     8 5. SETTING: "Has there been any recent work, exercise or other activity that involved that part of the body?"      No 6. AGGRAVATING FACTORS: "What makes the knee swelling worse?" (e.g., walking, climbing stairs, running)     Walking 7. ASSOCIATED SYMPTOMS: "Is there any pain or redness?"     Pain 8. OTHER SYMPTOMS: "Do you have any other symptoms?" (e.g., chest pain, difficulty breathing, fever, calf pain)     No 9. PREGNANCY: "Is there any chance you are pregnant?" "When was your last menstrual period?"     No  Protocols used: KNEE SWELLING-A-AH

## 2020-04-01 ENCOUNTER — Ambulatory Visit (INDEPENDENT_AMBULATORY_CARE_PROVIDER_SITE_OTHER): Payer: BC Managed Care – PPO | Admitting: Primary Care

## 2020-04-09 ENCOUNTER — Ambulatory Visit (INDEPENDENT_AMBULATORY_CARE_PROVIDER_SITE_OTHER): Payer: BC Managed Care – PPO | Admitting: Primary Care

## 2020-04-11 ENCOUNTER — Other Ambulatory Visit: Payer: Self-pay

## 2020-04-11 ENCOUNTER — Encounter (HOSPITAL_COMMUNITY): Payer: Self-pay | Admitting: Emergency Medicine

## 2020-04-11 ENCOUNTER — Ambulatory Visit (INDEPENDENT_AMBULATORY_CARE_PROVIDER_SITE_OTHER): Payer: BC Managed Care – PPO

## 2020-04-11 ENCOUNTER — Ambulatory Visit (HOSPITAL_COMMUNITY)
Admission: EM | Admit: 2020-04-11 | Discharge: 2020-04-11 | Disposition: A | Discharge status: Home / Self Care | Payer: BC Managed Care – PPO | Attending: Family Medicine | Admitting: Family Medicine

## 2020-04-11 DIAGNOSIS — R52 Pain, unspecified: Secondary | ICD-10-CM | POA: Diagnosis not present

## 2020-04-11 DIAGNOSIS — R609 Edema, unspecified: Secondary | ICD-10-CM | POA: Diagnosis not present

## 2020-04-11 DIAGNOSIS — M25562 Pain in left knee: Secondary | ICD-10-CM | POA: Diagnosis not present

## 2020-04-11 DIAGNOSIS — G8929 Other chronic pain: Secondary | ICD-10-CM | POA: Diagnosis not present

## 2020-04-11 MED ORDER — MELOXICAM 15 MG PO TABS
15.0000 mg | ORAL_TABLET | Freq: Every day | ORAL | 0 refills | Status: DC
Start: 2020-04-11 — End: 2020-04-24

## 2020-04-11 NOTE — ED Triage Notes (Signed)
Pt presents with left knee pain. States has gotten worse over last 3 months.

## 2020-04-11 NOTE — Discharge Instructions (Addendum)
Your x ray did not show anything concerning.  This is most likely some arthritis or tendinitis in the knee.  Meloxicam daily for pain, inflammation and swelling.  Ace wrap for compression and rest, ice and elevate.  Recommend seeing sports medicine for follow up  I have referred you there.

## 2020-04-12 NOTE — ED Provider Notes (Signed)
Allison Mcintyre    CSN: [id number] Arrival date & time: 04/11/20  1242      History   Chief Complaint Chief Complaint  Patient presents with  . Knee Pain    HPI Allison Mcintyre is a 59 y.o. female.   Patient is a 39-year-old female who presents today with left knee pain.  This is been a chronic issue for her.  Worsening over the past 3 months.  Does have a history of chronic knee pain.  Has not been take anything for the symptoms.  Denies any injuries or falls.  Mild swelling.     Past Medical History:  Diagnosis Date  . Anemia   . Hemorrhoids   . Hyperlipidemia   . Hypertension   . Menorrhagia     Patient Active Problem List   Diagnosis Date Noted  . Mild peripheral edema 11/19/2016  . Other fatigue 11/19/2016  . Daytime somnolence 11/19/2016  . Anemia 04/03/2015  . Palpitations 10/04/2014  . Venous (peripheral) insufficiency 10/04/2014  . Mass of right thigh 12/11/2013  . Elevated blood pressure 12/11/2013    Past Surgical History:  Procedure Laterality Date  . ABDOMINAL HYSTERECTOMY  2003  . CESAREAN SECTION    . COLONOSCOPY    . POLYPECTOMY      OB History    Gravida  4   Para  1   Term  1   Preterm      AB  3   Living        SAB      TAB  3   Ectopic      Multiple      Live Births               Home Medications    Prior to Admission medications   Medication Sig Start Date End Date Taking? Authorizing Provider  albuterol (VENTOLIN HFA) 108 (90 Base) MCG/ACT inhaler INHALE 1 TO 2 PUFFS BY MOUTH EVERY 6 HOURS AS NEEDED FOR WHEEZING FOR SHORTNESS OF BREATH 12/29/19   Kerin Perna, NP  amLODipine (NORVASC) 5 MG tablet Take 1 tablet (5 mg total) by mouth daily. 12/29/19   Kerin Perna, NP  chlorpheniramine-HYDROcodone (TUSSIONEX PENNKINETIC ER) 10-8 MG/5ML SUER Take 5 mLs by mouth at bedtime as needed for cough. 02/14/20   Kerin Perna, NP  guaiFENesin (ROBITUSSIN) 100 MG/5ML SOLN Take 5 mLs (100 mg  total) by mouth every 4 (four) hours as needed for cough or to loosen phlegm. 02/07/20   Kerin Perna, NP  hydrochlorothiazide (HYDRODIURIL) 25 MG tablet TAKE 1 TABLET BY MOUTH DAILY IN THE MORNING 12/29/19   Kerin Perna, NP  meloxicam (MOBIC) 15 MG tablet Take 1 tablet (15 mg total) by mouth daily. 04/11/20   Loura Halt A, NP  pravastatin (PRAVACHOL) 20 MG tablet Take 1 tablet (20 mg total) by mouth daily. 12/29/19   Kerin Perna, NP    Family History Family History  Problem Relation Age of Onset  . Cancer Mother   . Pancreatic cancer Mother   . Bronchitis Father   . Colon polyps Neg Hx   . Esophageal cancer Neg Hx   . Stomach cancer Neg Hx   . Rectal cancer Neg Hx   . Colon cancer Neg Hx     Social History Social History   Tobacco Use  . Smoking status: Current Every Day Smoker    Packs/day: 0.25    Types: Cigarettes  . Smokeless  tobacco: Never Used  Substance Use Topics  . Alcohol use: Yes    Alcohol/week: 3.0 standard drinks    Types: 3 Cans of beer per week    Comment: occasional , about weekly  . Drug use: No     Allergies   Patient has no known allergies.   Review of Systems Review of Systems   Physical Exam Triage Vital Signs ED Triage Vitals [04/11/20 1415]  Enc Vitals Group     BP (!) 130/105     Pulse Rate 77     Resp 17     Temp 98.2 F (36.8 C)     Temp Source Oral     SpO2 98 %     Weight      Height      Head Circumference      Peak Flow      Pain Score 5     Pain Loc      Pain Edu?      Excl. in [place]?    No data found.  Updated Vital Signs BP (!) 130/105 (BP Location: Left Arm)   Pulse 77   Temp 98.2 F (36.8 C) (Oral)   Resp 17   SpO2 98%   Visual Acuity Right Eye Distance:   Left Eye Distance:   Bilateral Distance:    Right Eye Near:   Left Eye Near:    Bilateral Near:     Physical Exam Vitals and nursing note reviewed.  Constitutional:      General: She is not in acute distress.    Appearance:  Normal appearance. She is not ill-appearing, toxic-appearing or diaphoretic.  HENT:     Head: Normocephalic.     Nose: Nose normal.  Eyes:     Conjunctiva/sclera: Conjunctivae normal.  Pulmonary:     Effort: Pulmonary effort is normal.  Musculoskeletal:     Cervical back: Normal range of motion.     Left knee: Swelling present. Decreased range of motion. Tenderness present.  Skin:    General: Skin is warm and dry.     Findings: No rash.  Neurological:     Mental Status: She is alert.  Psychiatric:        Mood and Affect: Mood normal.      UC Treatments / Results  Labs (all labs ordered are listed, but only abnormal results are displayed) Labs Reviewed - No data to display  EKG   Radiology DG Knee Complete 4 Views Left  Result Date: 04/11/2020 CLINICAL DATA:  Pain and swelling EXAM: LEFT KNEE - COMPLETE 4+ VIEW COMPARISON:  None. FINDINGS: Frontal, lateral, bilateral oblique, and sunrise patellar images were obtained. There is no fracture or dislocation. No joint effusion. There is mild to moderate joint space narrowing medially. Other joint spaces appear normal. There is a small spur arising from the anterior superior patella. IMPRESSION: Joint space narrowing medially. Other joint spaces appear unremarkable. Small spur along the anterior superior patella likely represents a degree of distal quadriceps tendinosis. No fracture, dislocation, or joint effusion. Electronically Signed   By: Lowella Grip III M.D.   On: 04/11/2020 14:56    Procedures Procedures (including critical care time)  Medications Ordered in UC Medications - No data to display  Initial Impression / Assessment and Plan / UC Course  I have reviewed the triage vital signs and the nursing notes.  Pertinent labs & imaging results that were available during my care of the patient were reviewed by me and  considered in my medical decision making (see chart for details).     Chronic left knee pain Most  likely due to arthritis. Will start on meloxicam daily. Ace wrap for compression, rest, ice, elevate.  Sports medicine for Follow-up Final Clinical Impressions(s) / UC Diagnoses   Final diagnoses:  Chronic pain of left knee     Discharge Instructions     Your x ray did not show anything concerning.  This is most likely some arthritis or tendinitis in the knee.  Meloxicam daily for pain, inflammation and swelling.  Ace wrap for compression and rest, ice and elevate.  Recommend seeing sports medicine for follow up  I have referred you there.      ED Prescriptions    Medication Sig Dispense Auth. Provider   meloxicam (MOBIC) 15 MG tablet Take 1 tablet (15 mg total) by mouth daily. 30 tablet Loura Halt A, NP     PDMP not reviewed this encounter.   Orvan July, NP 04/12/20 [phone]

## 2020-04-16 ENCOUNTER — Ambulatory Visit: Payer: BC Managed Care – PPO | Admitting: Family Medicine

## 2020-04-17 ENCOUNTER — Ambulatory Visit (INDEPENDENT_AMBULATORY_CARE_PROVIDER_SITE_OTHER): Payer: BC Managed Care – PPO | Admitting: Primary Care

## 2020-04-24 ENCOUNTER — Encounter: Payer: Self-pay | Admitting: Family Medicine

## 2020-04-24 ENCOUNTER — Other Ambulatory Visit: Payer: Self-pay

## 2020-04-24 ENCOUNTER — Ambulatory Visit (INDEPENDENT_AMBULATORY_CARE_PROVIDER_SITE_OTHER): Payer: BC Managed Care – PPO | Admitting: Family Medicine

## 2020-04-24 VITALS — BP 131/62 | Ht 65.0 in | Wt 265.0 lb

## 2020-04-24 DIAGNOSIS — M76899 Other specified enthesopathies of unspecified lower limb, excluding foot: Secondary | ICD-10-CM

## 2020-04-24 MED ORDER — MELOXICAM 15 MG PO TABS: 15.0000 mg | ORAL_TABLET | Freq: Every day | ORAL | 0 refills | Status: DC

## 2020-04-24 NOTE — Progress Notes (Addendum)
    SUBJECTIVE:   CHIEF COMPLAINT / HPI:   Chronic left knee pain: -Ms. Culbreth is a 59 year old female who presents today for follow-up after visiting the urgent care on 10/28 with chronic left knee pain. At the urgent care visit the patient was started on meloxicam and was provided an Ace wrap for compression as well as recommendation for rest ice and elevation. X-rays performed of the left knee at that time showed joint space narrowing on the medial aspect with small spur on the anterior superior patella which may represent a degree of distal quadriceps tendinosis.  There were no acute abnormalities noted.  Today, patient states that she continues to have pain mostly over the medial aspect of her knee.  It has been ongoing for the last 4 to 5 months.  It had an insidious onset without any obvious aggravating factors.  She did not have any specific injury prior to the onset of symptoms.  No new activities prior to onset of symptoms.  She describes as a stiffness.  It is worse when she stands from a seated position after prolonged sitting.  It is painful at the beginning of walking but improves after walking for a short time.  She locates the pain in the medial aspect of her knee extending into the proximal shin.   PERTINENT  PMH / PSH: Noted history of asthma, diabetes, hyperlipidemia, obesity.  No known history of knee osteoarthritis or knee issues.  No surgical history on the knees.  OBJECTIVE:   BP 131/62   Ht 5\' 5"  (1.651 m)   Wt 265 lb (120.2 kg)   BMI 44.10 kg/m    L Knee  Inspection: Bilateral knees without evidence of erythema, ecchymosis, swelling, edema. No effusion present  Active ROM: Intact. 0-160d. Strength: 5/5 strength to resisted flexion/extension.  She does have pain with resisted flexion, which is exacerbated by flexion with inverted toe. Patella: Negative patellar grind. No patellar facet tenderness. No apprehension. No proximal or distal patellar tendon tenderness to  palpation. No quad tendon tenderness to palpation.  Tibia: No tibial plateau, tibial tuberosity tenderness.  She does have tenderness over the pes anserine area. Joint line: No joint line tenderness.  Popliteal: No popliteal tenderness to the insertional gastroc. No insertional biceps femoris tenderness.  She does have tenderness to palpation of the medial hamstrings. Lachmans: Stable bilaterally with firm endpoint  Anterior/Posterior drawer: Stable bilaterally Varus/valgus stress at 0, 15d: Negative for pain, laxity   ASSESSMENT/PLAN:   Medial hamstrings tendinopathy Patient with exam findings and history consistent with hamstring tendinopathy.  Specifically, of the medial hamstring likely semimembranosus.  We will plan to start with escalating exercises, meloxicam daily as needed for pain.  Follow-up in 3 to 4 weeks for reevaluation.   Dagoberto Ligas, MD [place]    This note was prepared using Dragon voice recognition software and may include unintentional dictation errors due to the inherent limitations of voice recognition software.  Addendum:  Patient seen in the office by fellow and resident.  Their history, exam, plan of care were precepted with me.  Karlton Lemon MD [place]

## 2020-04-24 NOTE — Patient Instructions (Signed)
Thank you for coming in to see [place] today!  You have hamstrings tendinopathy.  Please see below to review our plan for today's visit:   1.   Please start taking meloxicam 15 mg/day for the next 2 weeks and then as needed after that. 2.   Please start doing the [place] hamstring exercises you are shown today. 3.   Please follow-up in 3 to 4 weeks for reevaluation.   Please call the clinic at [phone] if your symptoms worsen or you have any concerns. It was our pleasure to serve you.       Dr. Dagoberto Ligas [facility]

## 2020-04-29 ENCOUNTER — Ambulatory Visit (INDEPENDENT_AMBULATORY_CARE_PROVIDER_SITE_OTHER): Payer: BC Managed Care – PPO | Admitting: Primary Care

## 2020-05-12 ENCOUNTER — Other Ambulatory Visit (INDEPENDENT_AMBULATORY_CARE_PROVIDER_SITE_OTHER): Payer: Self-pay | Admitting: Primary Care

## 2020-05-12 DIAGNOSIS — I10 Essential (primary) hypertension: Secondary | ICD-10-CM

## 2020-06-02 ENCOUNTER — Other Ambulatory Visit (INDEPENDENT_AMBULATORY_CARE_PROVIDER_SITE_OTHER): Payer: Self-pay | Admitting: Primary Care

## 2020-06-02 DIAGNOSIS — I10 Essential (primary) hypertension: Secondary | ICD-10-CM

## 2020-06-02 NOTE — Telephone Encounter (Signed)
Requested Prescriptions  Pending Prescriptions Disp Refills   hydrochlorothiazide (HYDRODIURIL) 25 MG tablet [Pharmacy Med Name: hydroCHLOROthiazide 25 MG Oral Tablet] 90 tablet 0    Sig: TAKE 1 TABLET BY MOUTH ONCE DAILY IN THE MORNING     Cardiovascular: Diuretics - Thiazide Failed - 06/02/2020 10:24 AM      Failed - Ca in normal range and within 360 days    Calcium  Date Value Ref Range Status  12/29/2018 9.9 8.7 - 10.2 mg/dL Final   Calcium, Ion  Date Value Ref Range Status  08/06/2017 1.18 1.15 - 1.40 mmol/L Final         Failed - Cr in normal range and within 360 days    Creatinine, Ser  Date Value Ref Range Status  12/29/2018 0.73 0.57 - 1.00 mg/dL Final         Failed - K in normal range and within 360 days    Potassium  Date Value Ref Range Status  12/29/2018 3.7 3.5 - 5.2 mmol/L Final         Failed - Na in normal range and within 360 days    Sodium  Date Value Ref Range Status  12/29/2018 139 134 - 144 mmol/L Final         Passed - Last BP in normal range    BP Readings from Last 1 Encounters:  04/24/20 131/62         Passed - Valid encounter within last 6 months    Recent Outpatient Visits          3 months ago Upper respiratory infection with cough and congestion   [facility] Kerin Perna, NP   5 months ago Tick bite, initial encounter   [place], Michelle P, NP   8 months ago Chronic pain of both knees   [place] Kerin Perna, NP   1 year ago Hypertension, unspecified type   [place] Kerin Perna, NP   1 year ago Hypertension, unspecified type   [place] Kerin Perna, NP      Future Appointments            In 1 month Edwards, Milford Cage, NP [facility]            pravastatin (PRAVACHOL) 20 MG tablet [Pharmacy Med Name: Pravastatin Sodium 20 MG Oral  Tablet] 90 tablet     Sig: Take 1 tablet by mouth once daily     Cardiovascular:  Antilipid - Statins Failed - 06/02/2020 10:24 AM      Failed - Total Cholesterol in normal range and within 360 days    Cholesterol, Total  Date Value Ref Range Status  12/29/2018 154 100 - 199 mg/dL Final         Failed - LDL in normal range and within 360 days    LDL Calculated  Date Value Ref Range Status  12/29/2018 70 0 - 99 mg/dL Final         Failed - HDL in normal range and within 360 days    HDL  Date Value Ref Range Status  12/29/2018 57 >39 mg/dL Final         Failed - Triglycerides in normal range and within 360 days    Triglycerides  Date Value Ref Range Status  12/29/2018 137 0 - 149 mg/dL Final  Passed - Patient is not pregnant      Passed - Valid encounter within last 12 months    Recent Outpatient Visits          3 months ago Upper respiratory infection with cough and congestion   [facility] Kerin Perna, NP   5 months ago Tick bite, initial encounter   [place], Michelle P, NP   8 months ago Chronic pain of both knees   [facility] Kerin Perna, NP   1 year ago Hypertension, unspecified type   [place] Kerin Perna, NP   1 year ago Hypertension, unspecified type   [place] Kerin Perna, NP      Future Appointments            In 1 month Oletta Lamas, Milford Cage, NP [place]

## 2020-06-02 NOTE — Telephone Encounter (Signed)
Requested medication (s) are due for refill today: yes  Requested medication (s) are on the active medication list: yes  Last refill:  12/29/19  Future visit scheduled: yes  Notes to clinic:  overdue lab work   Requested Prescriptions  Pending Prescriptions Disp Refills   pravastatin (PRAVACHOL) 20 MG tablet [Pharmacy Med Name: Pravastatin Sodium 20 MG Oral Tablet] 90 tablet     Sig: Take 1 tablet by mouth once daily      Cardiovascular:  Antilipid - Statins Failed - 06/02/2020 10:24 AM      Failed - Total Cholesterol in normal range and within 360 days    Cholesterol, Total  Date Value Ref Range Status  12/29/2018 154 100 - 199 mg/dL Final          Failed - LDL in normal range and within 360 days    LDL Calculated  Date Value Ref Range Status  12/29/2018 70 0 - 99 mg/dL Final          Failed - HDL in normal range and within 360 days    HDL  Date Value Ref Range Status  12/29/2018 57 >39 mg/dL Final          Failed - Triglycerides in normal range and within 360 days    Triglycerides  Date Value Ref Range Status  12/29/2018 137 0 - 149 mg/dL Final          Passed - Patient is not pregnant      Passed - Valid encounter within last 12 months    Recent Outpatient Visits           3 months ago Upper respiratory infection with cough and congestion   [facility] Kerin Perna, NP   5 months ago Tick bite, initial encounter   [place], Michelle P, NP   8 months ago Chronic pain of both knees   [place] Kerin Perna, NP   1 year ago Hypertension, unspecified type   [facility] Kerin Perna, NP   1 year ago Hypertension, unspecified type   [facility] Kerin Perna, NP       Future Appointments             In 1 month Edwards, Milford Cage, NP [facility]              Signed  Prescriptions Disp Refills   hydrochlorothiazide (HYDRODIURIL) 25 MG tablet 90 tablet 0    Sig: TAKE 1 TABLET BY MOUTH ONCE DAILY IN THE MORNING      Cardiovascular: Diuretics - Thiazide Failed - 06/02/2020 10:24 AM      Failed - Ca in normal range and within 360 days    Calcium  Date Value Ref Range Status  12/29/2018 9.9 8.7 - 10.2 mg/dL Final   Calcium, Ion  Date Value Ref Range Status  08/06/2017 1.18 1.15 - 1.40 mmol/L Final          Failed - Cr in normal range and within 360 days    Creatinine, Ser  Date Value Ref Range Status  12/29/2018 0.73 0.57 - 1.00 mg/dL Final          Failed - K in normal range and within 360 days    Potassium  Date Value Ref Range Status  12/29/2018 3.7 3.5 - 5.2 mmol/L Final  Failed - Na in normal range and within 360 days    Sodium  Date Value Ref Range Status  12/29/2018 139 134 - 144 mmol/L Final          Passed - Last BP in normal range    BP Readings from Last 1 Encounters:  04/24/20 131/62          Passed - Valid encounter within last 6 months    Recent Outpatient Visits           3 months ago Upper respiratory infection with cough and congestion   [facility] Kerin Perna, NP   5 months ago Tick bite, initial encounter   [place], Michelle P, NP   8 months ago Chronic pain of both knees   [place] Kerin Perna, NP   1 year ago Hypertension, unspecified type   [place] Kerin Perna, NP   1 year ago Hypertension, unspecified type   [place] Kerin Perna, NP       Future Appointments             In 1 month Oletta Lamas, Milford Cage, NP [place]

## 2020-06-24 ENCOUNTER — Ambulatory Visit: Payer: Self-pay | Attending: Family

## 2020-06-24 DIAGNOSIS — Z23 Encounter for immunization: Secondary | ICD-10-CM

## 2020-07-03 ENCOUNTER — Other Ambulatory Visit: Payer: Self-pay

## 2020-07-03 ENCOUNTER — Ambulatory Visit (INDEPENDENT_AMBULATORY_CARE_PROVIDER_SITE_OTHER): Payer: BC Managed Care – PPO | Admitting: Certified Nurse Midwife

## 2020-07-03 ENCOUNTER — Encounter: Payer: Self-pay | Admitting: Certified Nurse Midwife

## 2020-07-03 ENCOUNTER — Other Ambulatory Visit (HOSPITAL_COMMUNITY)
Admission: RE | Admit: 2020-07-03 | Discharge: 2020-07-03 | Disposition: A | Discharge status: Home / Self Care | Payer: BC Managed Care – PPO | Source: Ambulatory Visit | Attending: Certified Nurse Midwife | Admitting: Certified Nurse Midwife

## 2020-07-03 VITALS — BP 128/89 | HR 72 | Wt 252.0 lb

## 2020-07-03 DIAGNOSIS — Z01419 Encounter for gynecological examination (general) (routine) without abnormal findings: Secondary | ICD-10-CM

## 2020-07-03 DIAGNOSIS — Z113 Encounter for screening for infections with a predominantly sexual mode of transmission: Secondary | ICD-10-CM

## 2020-07-03 DIAGNOSIS — A5901 Trichomonal vulvovaginitis: Secondary | ICD-10-CM | POA: Insufficient documentation

## 2020-07-03 DIAGNOSIS — B9689 Other specified bacterial agents as the cause of diseases classified elsewhere: Secondary | ICD-10-CM | POA: Diagnosis not present

## 2020-07-03 DIAGNOSIS — N760 Acute vaginitis: Secondary | ICD-10-CM | POA: Diagnosis not present

## 2020-07-03 DIAGNOSIS — B373 Candidiasis of vulva and vagina: Secondary | ICD-10-CM | POA: Insufficient documentation

## 2020-07-03 NOTE — Progress Notes (Signed)
Patient reports having a sticky discharge x 3 months.

## 2020-07-03 NOTE — Progress Notes (Signed)
GYNECOLOGY CLINIC ANNUAL PREVENTATIVE CARE ENCOUNTER NOTE  Subjective:   Allison Mcintyre is a 60 y.o. G12P1030 female here for a routine annual gynecologic exam.  Current complaints: sticky discharge x3 mo, says it is often on her leg in the morning when she wakes up. She desires STI testing. Denies abnormal vaginal bleeding, pelvic pain, problems with intercourse or other gynecologic concerns.    Gynecologic History No LMP recorded. Patient has had a hysterectomy. Contraception: none Last Pap: 2015, normal, no additional pap screenings necessary.  Last mammogram: 06/30/19. Results were: normal  Obstetric History OB History  Gravida Para Term Preterm AB Living  4 1 1   3     SAB IAB Ectopic Multiple Live Births    3          # Outcome Date GA Lbr Len/2nd Weight Sex Delivery Anes PTL Lv  4 IAB           3 IAB           2 IAB           1 Term      CS-Unspec       Past Medical History:  Diagnosis Date  . Anemia   . Hemorrhoids   . Hyperlipidemia   . Hypertension   . Menorrhagia     Past Surgical History:  Procedure Laterality Date  . ABDOMINAL HYSTERECTOMY  2003  . CESAREAN SECTION    . COLONOSCOPY    . POLYPECTOMY      Current Outpatient Medications on File Prior to Visit  Medication Sig Dispense Refill  . hydrochlorothiazide (HYDRODIURIL) 25 MG tablet TAKE 1 TABLET BY MOUTH ONCE DAILY IN THE MORNING 90 tablet 0  . meloxicam (MOBIC) 15 MG tablet Take 1 tablet (15 mg total) by mouth daily. 30 tablet 0  . albuterol (VENTOLIN HFA) 108 (90 Base) MCG/ACT inhaler INHALE 1 TO 2 PUFFS BY MOUTH EVERY 6 HOURS AS NEEDED FOR WHEEZING FOR SHORTNESS OF BREATH (Patient not taking: Reported on 07/03/2020) 8 g 2  . amLODipine (NORVASC) 5 MG tablet Take 1 tablet (5 mg total) by mouth daily. (Patient not taking: Reported on 07/03/2020) 90 tablet 1  . chlorpheniramine-HYDROcodone (TUSSIONEX PENNKINETIC ER) 10-8 MG/5ML SUER Take 5 mLs by mouth at bedtime as needed for cough. (Patient  not taking: Reported on 07/03/2020) 115 mL 0  . guaiFENesin (ROBITUSSIN) 100 MG/5ML SOLN Take 5 mLs (100 mg total) by mouth every 4 (four) hours as needed for cough or to loosen phlegm. (Patient not taking: Reported on 07/03/2020) 236 mL 0  . pravastatin (PRAVACHOL) 20 MG tablet Take 1 tablet by mouth once daily 90 tablet 0   No current facility-administered medications on file prior to visit.    No Known Allergies  Social History   Socioeconomic History  . Marital status: Single    Spouse name: Not on file  . Number of children: Not on file  . Years of education: Not on file  . Highest education level: Not on file  Occupational History  . Not on file  Tobacco Use  . Smoking status: Current Every Day Smoker    Packs/day: 0.25    Types: Cigarettes  . Smokeless tobacco: Never Used  Substance and Sexual Activity  . Alcohol use: Yes    Alcohol/week: 3.0 standard drinks    Types: 3 Cans of beer per week    Comment: occasional , about weekly  . Drug use: No  . Sexual activity:  Yes    Birth control/protection: Surgical  Other Topics Concern  . Not on file  Social History Narrative  . Not on file   Social Determinants of Health   Financial Resource Strain: Not on file  Food Insecurity: Not on file  Transportation Needs: Not on file  Physical Activity: Not on file  Stress: Not on file  Social Connections: Not on file  Intimate Partner Violence: Not on file    Family History  Problem Relation Age of Onset  . Cancer Mother   . Pancreatic cancer Mother   . Bronchitis Father   . Colon polyps Neg Hx   . Esophageal cancer Neg Hx   . Stomach cancer Neg Hx   . Rectal cancer Neg Hx   . Colon cancer Neg Hx     The following portions of the patient's history were reviewed and updated as appropriate: allergies, current medications, past family history, past medical history, past social history, past surgical history and problem list.  Review of Systems Pertinent items noted in  HPI and remainder of comprehensive ROS otherwise negative.   Objective:  BP 128/89   Pulse 72   Wt 252 lb (114.3 kg)   BMI 41.93 kg/m  CONSTITUTIONAL: Well-developed, well-nourished female in no acute distress.  HENT:  Normocephalic, atraumatic, External right and left ear normal. Oropharynx is clear and moist EYES: Conjunctivae and EOM are normal. Pupils are equal, round, and reactive to light. No scleral icterus.  NECK: Normal range of motion, supple, no masses.  Normal thyroid.  SKIN: Skin is warm and dry. No rash noted. Not diaphoretic. No erythema. No pallor. [place]: Alert and oriented to person, place, and time. Normal reflexes, muscle tone coordination. No cranial nerve deficit noted. PSYCHIATRIC: Normal mood and affect. Normal behavior. Normal judgment and thought content. CARDIOVASCULAR: Normal heart rate noted, regular rhythm RESPIRATORY: Clear to auscultation bilaterally. Effort and breath sounds normal, no problems with respiration noted. BREASTS: Symmetric in size. No masses, skin changes, nipple drainage, or lymphadenopathy. ABDOMEN: Soft, normal bowel sounds, no distention noted.  No tenderness, rebound or guarding.  PELVIC: Normal appearing external genitalia; normal appearing vaginal mucosa and cervix.  No abnormal discharge noted.  Pap smear obtained.  Normal uterine size, no other palpable masses, no uterine or adnexal tenderness. MUSCULOSKELETAL: Normal range of motion. No tenderness.  No cyanosis, clubbing, or edema.  2+ distal pulses.   Assessment:  Annual gynecologic examination with STI testing   Plan:  Will follow up results of testing and manage accordingly Mammogram scheduled Routine preventative health maintenance measures emphasized. Please refer to After Visit Summary for other counseling recommendations.   Gaylan Gerold, CNM, MSN, [place] Certified Nurse Midwife, [facility]

## 2020-07-03 NOTE — Patient Instructions (Signed)
Healthy Eating Following a healthy eating pattern may help you to achieve and maintain a healthy body weight, reduce the risk of chronic disease, and live a long and productive life. It is important to follow a healthy eating pattern at an appropriate calorie level for your body. Your nutritional needs should be met primarily through food by choosing a variety of nutrient-rich foods. What are tips for following this plan? Reading food labels  Read labels and choose the following: ? Reduced or low sodium. ? Juices with 100% fruit juice. ? Foods with low saturated fats and high polyunsaturated and monounsaturated fats. ? Foods with whole grains, such as whole wheat, cracked wheat, brown rice, and wild rice. ? Whole grains that are fortified with folic acid. This is recommended for women who are pregnant or who want to become pregnant.  Read labels and avoid the following: ? Foods with a lot of added sugars. These include foods that contain brown sugar, corn sweetener, corn syrup, dextrose, fructose, glucose, high-fructose corn syrup, honey, invert sugar, lactose, malt syrup, maltose, molasses, raw sugar, sucrose, trehalose, or turbinado sugar.  Do not eat more than the following amounts of added sugar per day:  6 teaspoons (25 g) for women.  9 teaspoons (38 g) for men. ? Foods that contain processed or refined starches and grains. ? Refined grain products, such as white flour, degermed cornmeal, white bread, and white rice. Shopping  Choose nutrient-rich snacks, such as vegetables, whole fruits, and nuts. Avoid high-calorie and high-sugar snacks, such as potato chips, fruit snacks, and candy.  Use oil-based dressings and spreads on foods instead of solid fats such as butter, stick margarine, or cream cheese.  Limit pre-made sauces, mixes, and "instant" products such as flavored rice, instant noodles, and ready-made pasta.  Try more plant-protein sources, such as tofu, tempeh, black beans,  edamame, lentils, nuts, and seeds.  Explore eating plans such as the Mediterranean diet or vegetarian diet. Cooking  Use oil to saut or stir-fry foods instead of solid fats such as butter, stick margarine, or lard.  Try baking, boiling, grilling, or broiling instead of frying.  Remove the fatty part of meats before cooking.  Steam vegetables in water or broth. Meal planning  At meals, imagine dividing your plate into fourths: ? One-half of your plate is fruits and vegetables. ? One-fourth of your plate is whole grains. ? One-fourth of your plate is protein, especially lean meats, poultry, eggs, tofu, beans, or nuts.  Include low-fat dairy as part of your daily diet.   Lifestyle  Choose healthy options in all settings, including home, work, school, restaurants, or stores.  Prepare your food safely: ? Wash your hands after handling raw meats. ? Keep food preparation surfaces clean by regularly washing with hot, soapy water. ? Keep raw meats separate from ready-to-eat foods, such as fruits and vegetables. ? Cook seafood, meat, poultry, and eggs to the recommended internal temperature. ? Store foods at safe temperatures. In general:  Keep cold foods at 7F (4.4C) or below.  Keep hot foods at 17F (60C) or above.  Keep your freezer at [facility] (-17.8C) or below.  Foods are no longer safe to eat when they have been between the temperatures of 40-17F (4.4-60C) for more than 2 hours. What foods should I eat? Fruits Aim to eat 2 cup-equivalents of fresh, canned (in natural juice), or frozen fruits each day. Examples of 1 cup-equivalent of fruit include 1 small apple, 8 large strawberries, 1 cup canned fruit,  cup dried fruit, or 1 cup 100% juice. Vegetables Aim to eat 2-3 cup-equivalents of fresh and frozen vegetables each day, including different varieties and colors. Examples of 1 cup-equivalent of vegetables include 2 medium carrots, 2 cups raw, leafy greens, 1 cup chopped  vegetable (raw or cooked), or 1 medium baked potato. Grains Aim to eat 6 ounce-equivalents of whole grains each day. Examples of 1 ounce-equivalent of grains include 1 slice of bread, 1 cup ready-to-eat cereal, 3 cups popcorn, or  cup cooked rice, pasta, or cereal. Meats and other proteins Aim to eat 5-6 ounce-equivalents of protein each day. Examples of 1 ounce-equivalent of protein include 1 egg, 1/2 cup nuts or seeds, or 1 tablespoon (16 g) peanut butter. A cut of meat or fish that is the size of a deck of cards is about 3-4 ounce-equivalents.  Of the protein you eat each week, try to have at least 8 ounces come from seafood. This includes salmon, trout, herring, and anchovies. Dairy Aim to eat 3 cup-equivalents of fat-free or low-fat dairy each day. Examples of 1 cup-equivalent of dairy include 1 cup (240 mL) milk, 8 ounces (250 g) yogurt, 1 ounces (44 g) natural cheese, or 1 cup (240 mL) fortified soy milk. Fats and oils  Aim for about 5 teaspoons (21 g) per day. Choose monounsaturated fats, such as canola and olive oils, avocados, peanut butter, and most nuts, or polyunsaturated fats, such as sunflower, corn, and soybean oils, walnuts, pine nuts, sesame seeds, sunflower seeds, and flaxseed. Beverages  Aim for six 8-oz glasses of water per day. Limit coffee to three to five 8-oz cups per day.  Limit caffeinated beverages that have added calories, such as soda and energy drinks.  Limit alcohol intake to no more than 1 drink a day for nonpregnant women and 2 drinks a day for men. One drink equals 12 oz of beer (355 mL), 5 oz of wine (148 mL), or 1 oz of hard liquor (44 mL). Seasoning and other foods  Avoid adding excess amounts of salt to your foods. Try flavoring foods with herbs and spices instead of salt.  Avoid adding sugar to foods.  Try using oil-based dressings, sauces, and spreads instead of solid fats. This information is based on general [place] nutrition guidelines. For more  information, visit choosemyplate.gov. Exact amounts may vary based on your nutrition needs. Summary  A healthy eating plan may help you to maintain a healthy weight, reduce the risk of chronic diseases, and stay active throughout your life.  Plan your meals. Make sure you eat the right portions of a variety of nutrient-rich foods.  Try baking, boiling, grilling, or broiling instead of frying.  Choose healthy options in all settings, including home, work, school, restaurants, or stores. This information is not intended to replace advice given to you by your health care provider. Make sure you discuss any questions you have with your health care provider. Document Revised: 09/13/2017 Document Reviewed: 09/13/2017 Elsevier Patient Education  2021 Elsevier Inc.  

## 2020-07-04 LAB — HIV ANTIBODY (ROUTINE TESTING W REFLEX): HIV Screen 4th Generation wRfx: NONREACTIVE

## 2020-07-04 LAB — HEPATITIS C ANTIBODY: Hep C Virus Ab: 0.1 s/co ratio (ref 0.0–0.9)

## 2020-07-04 LAB — RPR: RPR Ser Ql: NONREACTIVE

## 2020-07-05 LAB — CERVICOVAGINAL ANCILLARY ONLY
Bacterial Vaginitis (gardnerella): POSITIVE — AB
Candida Glabrata: NEGATIVE
Candida Vaginitis: POSITIVE — AB
Chlamydia: NEGATIVE
Comment: NEGATIVE
Comment: NEGATIVE
Comment: NEGATIVE
Comment: NEGATIVE
Comment: NEGATIVE
Comment: NORMAL
Neisseria Gonorrhea: NEGATIVE
Trichomonas: POSITIVE — AB

## 2020-07-08 ENCOUNTER — Ambulatory Visit (INDEPENDENT_AMBULATORY_CARE_PROVIDER_SITE_OTHER): Payer: BC Managed Care – PPO | Admitting: Primary Care

## 2020-07-10 ENCOUNTER — Telehealth (INDEPENDENT_AMBULATORY_CARE_PROVIDER_SITE_OTHER): Payer: Self-pay | Admitting: Primary Care

## 2020-07-10 DIAGNOSIS — B9689 Other specified bacterial agents as the cause of diseases classified elsewhere: Secondary | ICD-10-CM

## 2020-07-10 DIAGNOSIS — N760 Acute vaginitis: Secondary | ICD-10-CM

## 2020-07-10 DIAGNOSIS — A599 Trichomoniasis, unspecified: Secondary | ICD-10-CM

## 2020-07-10 DIAGNOSIS — B3731 Acute candidiasis of vulva and vagina: Secondary | ICD-10-CM

## 2020-07-10 DIAGNOSIS — B373 Candidiasis of vulva and vagina: Secondary | ICD-10-CM

## 2020-07-10 MED ORDER — FLUCONAZOLE 150 MG PO TABS
150.0000 mg | ORAL_TABLET | Freq: Once | ORAL | 0 refills | Status: AC
Start: 2020-07-10 — End: 2020-07-10

## 2020-07-10 MED ORDER — METRONIDAZOLE 500 MG PO TABS: 500.0000 mg | ORAL_TABLET | Freq: Two times a day (BID) | ORAL | 0 refills | Status: DC

## 2020-07-10 NOTE — Telephone Encounter (Signed)
Patient inquiring about 07/03/2020 lab results, please advise.

## 2020-07-10 NOTE — Telephone Encounter (Signed)
Left voice message for patient to return nurse call regarding test results.  Ranie Chinchilla L, RN  

## 2020-07-10 NOTE — Telephone Encounter (Signed)
Please provide results to patient.

## 2020-07-10 NOTE — Telephone Encounter (Signed)
Patient called to discuss lab results from 07/03/2020 visit. Pt verified DO. Patient +BV/Yeast/Trich. HIV/RPR/Hep C negative. Metronidazole 500 mg 1 tab by PO BID x 7 days with food and Diflucan 150 mg 1 tab PO x 1 by standing order, Gaylan Gerold, CNM. Advised patient to call clinic in 3-4 weeks for re-screening or she could schedule with PCP.  Derl Barrow, RN

## 2020-08-08 ENCOUNTER — Other Ambulatory Visit: Payer: Self-pay | Admitting: Family Medicine

## 2020-08-08 ENCOUNTER — Other Ambulatory Visit (INDEPENDENT_AMBULATORY_CARE_PROVIDER_SITE_OTHER): Payer: Self-pay | Admitting: Primary Care

## 2020-08-08 DIAGNOSIS — I10 Essential (primary) hypertension: Secondary | ICD-10-CM

## 2020-08-08 NOTE — Telephone Encounter (Signed)
Requested medication (s) are due for refill today:yes  Requested medication (s) are on the active medication list: yes  Last refill:  06/04/2021  Future visit scheduled: no  Notes to clinic:  Patient canceled appt on 07/08/2020 Overdue for follow up    Requested Prescriptions  Pending Prescriptions Disp Refills   amLODipine (NORVASC) 5 MG tablet [Pharmacy Med Name: amLODIPine Besylate 5 MG Oral Tablet] 90 tablet 0    Sig: Take 1 tablet by mouth once daily      Cardiovascular:  Calcium Channel Blockers Failed - 08/08/2020  9:31 AM      Failed - Valid encounter within last 6 months    Recent Outpatient Visits           6 months ago Upper respiratory infection with cough and congestion   [facility] Kerin Perna, NP   7 months ago Tick bite, initial encounter   [place] Juluis Mire P, NP   10 months ago Chronic pain of both knees   [facility] Kerin Perna, NP   1 year ago Hypertension, unspecified type   [place] Kerin Perna, NP   1 year ago Hypertension, unspecified type   [place] Kerin Perna, NP                Passed - Last BP in normal range    BP Readings from Last 1 Encounters:  07/03/20 128/89

## 2020-09-12 ENCOUNTER — Other Ambulatory Visit (INDEPENDENT_AMBULATORY_CARE_PROVIDER_SITE_OTHER): Payer: Self-pay | Admitting: Primary Care

## 2020-09-12 DIAGNOSIS — I10 Essential (primary) hypertension: Secondary | ICD-10-CM

## 2020-09-12 NOTE — Telephone Encounter (Signed)
Requested medication (s) are due for refill today: yes  Requested medication (s) are on the active medication list: yes  Last refill:  06/04/2020  Future visit scheduled:no  Notes to clinic:  overdue for follow up appointment  Left vm for patient to callback    Requested Prescriptions  Pending Prescriptions Disp Refills   amLODipine (NORVASC) 5 MG tablet [Pharmacy Med Name: amLODIPine Besylate 5 MG Oral Tablet] 90 tablet 0    Sig: Take 1 tablet by mouth once daily      Cardiovascular:  Calcium Channel Blockers Failed - 09/12/2020  9:07 AM      Failed - Valid encounter within last 6 months    Recent Outpatient Visits           7 months ago Upper respiratory infection with cough and congestion   [facility] Kerin Perna, NP   8 months ago Tick bite, initial encounter   [place] Juluis Mire P, NP   11 months ago Chronic pain of both knees   [place] Kerin Perna, NP   1 year ago Hypertension, unspecified type   [place] Kerin Perna, NP   1 year ago Hypertension, unspecified type   [place] Kerin Perna, NP                Passed - Last BP in normal range    BP Readings from Last 1 Encounters:  07/03/20 128/89

## 2020-10-10 ENCOUNTER — Other Ambulatory Visit (INDEPENDENT_AMBULATORY_CARE_PROVIDER_SITE_OTHER): Payer: Self-pay | Admitting: Primary Care

## 2020-10-10 DIAGNOSIS — I10 Essential (primary) hypertension: Secondary | ICD-10-CM

## 2020-10-10 NOTE — Telephone Encounter (Signed)
Courtesy refill. Future appt 10/29/20.

## 2020-10-15 ENCOUNTER — Ambulatory Visit (INDEPENDENT_AMBULATORY_CARE_PROVIDER_SITE_OTHER): Payer: Self-pay | Admitting: *Deleted

## 2020-10-15 NOTE — Telephone Encounter (Signed)
Patient called to request appt today with PCP. Called in earlier today with c/o chest pain. Attempted to review if patient continues to have chest pain. Patient reports she is not having chest pain at this time and refused to answer questions. Recommended patient go to UC if chest pain returns and lasting longer than 5 minutes go to ED. Patient refused and only wants appt with PCP . Attempted to contact FC in clinic , no answer. Encouraged patient to keep appt scheduled for 10/29/20 and NT will send encounter for PCP to review. Reviewed with patient she would be contacted if any available appt came open , if not please be seen at [facility] or ED if symptoms worsen.   Answer Assessment - Initial Assessment Questions 1. LOCATION: "Where does it hurt?"       Patient would not answer question and reports she does not have chest pain at this time  2. RADIATION: "Does the pain go anywhere else?" (e.g., into neck, jaw, arms, back)     na 3. ONSET: "When did the chest pain begin?" (Minutes, hours or days)      na 4. PATTERN "Does the pain come and go, or has it been constant since it started?"  "Does it get worse with exertion?"      na 5. DURATION: "How long does it last" (e.g., seconds, minutes, hours)     na 6. SEVERITY: "How bad is the pain?"  (e.g., Scale 1-10; mild, moderate, or severe)    - MILD (1-3): doesn't interfere with normal activities     - MODERATE (4-7): interferes with normal activities or awakens from sleep    - SEVERE (8-10): excruciating pain, unable to do any normal activities       na 7. CARDIAC RISK FACTORS: "Do you have any history of heart problems or risk factors for heart disease?" (e.g., angina, prior heart attack; diabetes, high blood pressure, high cholesterol, smoker, or strong family history of heart disease)     na 8. PULMONARY RISK FACTORS: "Do you have any history of lung disease?"  (e.g., blood clots in lung, asthma, emphysema, birth control pills)     na 9. CAUSE: "What do  you think is causing the chest pain?"     na 10. OTHER SYMPTOMS: "Do you have any other symptoms?" (e.g., dizziness, nausea, vomiting, sweating, fever, difficulty breathing, cough)       na 11. PREGNANCY: "Is there any chance you are pregnant?" "When was your last menstrual period?"       na  Protocols used: CHEST PAIN-A-AH

## 2020-10-15 NOTE — Telephone Encounter (Signed)
Patient is calling to report R sided chest pain that comes and goes- lasting several hours and causes pain to breath when occurring. Patient missed work today because of it. Patient states it has been going on since last Friday. Patient is requesting office appointment because she needs a note for work- advised ED/UC- be seen today. She states she will go to UC.   Reason for Disposition . [1] Chest pain (or "angina") comes and goes AND [2] is happening more often (increasing in frequency) or getting worse (increasing in severity) (Exception: chest pains that last only a few seconds)  Answer Assessment - Initial Assessment Questions 1. LOCATION: "Where does it hurt?"       Shoulder to chest- right,  2. RADIATION: "Does the pain go anywhere else?" (e.g., into neck, jaw, arms, back)     Neck pain 3. ONSET: "When did the chest pain begin?" (Minutes, hours or days)      Started last Friday-  4. PATTERN "Does the pain come and go, or has it been constant since it started?"  "Does it get worse with exertion?"      Comes and goes 5. DURATION: "How long does it last" (e.g., seconds, minutes, hours)     4-5 hours 6. SEVERITY: "How bad is the pain?"  (e.g., Scale 1-10; mild, moderate, or severe)    - MILD (1-3): doesn't interfere with normal activities     - MODERATE (4-7): interferes with normal activities or awakens from sleep    - SEVERE (8-10): excruciating pain, unable to do any normal activities       severe 7. CARDIAC RISK FACTORS: "Do you have any history of heart problems or risk factors for heart disease?" (e.g., angina, prior heart attack; diabetes, high blood pressure, high cholesterol, smoker, or strong family history of heart disease)     no 8. PULMONARY RISK FACTORS: "Do you have any history of lung disease?"  (e.g., blood clots in lung, asthma, emphysema, birth control pills)     no 9. CAUSE: "What do you think is causing the chest pain?"     no 10. OTHER SYMPTOMS: "Do you have any  other symptoms?" (e.g., dizziness, nausea, vomiting, sweating, fever, difficulty breathing, cough)       Difficultly breathing when pian occurs 11. PREGNANCY: "Is there any chance you are pregnant?" "When was your last menstrual period?"      n/a  Protocols used: CHEST PAIN-A-AH

## 2020-10-16 NOTE — Telephone Encounter (Signed)
Called patient refused to go ED chest pain on right side and radiates to right shoulder- Denies shortness of breath but when she breaths exhale and inhales hurts some times- she admits to smoking cigarettes denies h/a , no swelling unable to take Bp. Told PCP to call her back in AM she was sleep and I woke her up

## 2020-10-24 NOTE — Progress Notes (Signed)
   Covid-19 Vaccination Clinic  Name:  Allison Mcintyre    MRN: [id number] DOB: 27-Apr-1961  10/24/2020  [name] was observed post Covid-19 immunization for 15 minutes without incident. She was provided with Vaccine Information Sheet and instruction to access the V-Safe system.   [name] was instructed to call 911 with any severe reactions post vaccine: Marland Kitchen Difficulty breathing  . Swelling of face and throat  . A fast heartbeat  . A bad rash all over body  . Dizziness and weakness   Immunizations Administered    Name Date Dose VIS Date Route   Moderna Covid-19 Booster Vaccine 06/24/2020 10:00 AM 0.25 mL 04/03/2020 Intramuscular   Manufacturer: Moderna   Lot: [id number]   [place]: 64403-474-25

## 2020-10-28 ENCOUNTER — Ambulatory Visit: Payer: BC Managed Care – PPO

## 2020-10-29 ENCOUNTER — Telehealth (INDEPENDENT_AMBULATORY_CARE_PROVIDER_SITE_OTHER): Payer: BC Managed Care – PPO | Admitting: Primary Care

## 2020-11-14 ENCOUNTER — Ambulatory Visit (INDEPENDENT_AMBULATORY_CARE_PROVIDER_SITE_OTHER): Payer: BC Managed Care – PPO | Admitting: Primary Care

## 2020-12-09 ENCOUNTER — Ambulatory Visit (INDEPENDENT_AMBULATORY_CARE_PROVIDER_SITE_OTHER): Payer: BC Managed Care – PPO | Admitting: Primary Care

## 2020-12-20 ENCOUNTER — Inpatient Hospital Stay: Admission: RE | Admit: 2020-12-20 | Payer: BC Managed Care – PPO | Source: Ambulatory Visit

## 2021-01-04 ENCOUNTER — Telehealth (INDEPENDENT_AMBULATORY_CARE_PROVIDER_SITE_OTHER): Payer: Self-pay | Admitting: Family Medicine

## 2021-01-04 ENCOUNTER — Other Ambulatory Visit (INDEPENDENT_AMBULATORY_CARE_PROVIDER_SITE_OTHER): Payer: Self-pay | Admitting: Primary Care

## 2021-01-04 ENCOUNTER — Other Ambulatory Visit: Payer: Self-pay | Admitting: Family Medicine

## 2021-01-04 DIAGNOSIS — I10 Essential (primary) hypertension: Secondary | ICD-10-CM

## 2021-01-04 DIAGNOSIS — Z760 Encounter for issue of repeat prescription: Secondary | ICD-10-CM

## 2021-01-04 NOTE — Telephone Encounter (Signed)
HCTZ: 05/15/20 #90   amlodipine: 10/10/20 #90  Needs appt. Sent pt message via MyChart to make appt. Requested Prescriptions  Pending Prescriptions Disp Refills   hydrochlorothiazide (HYDRODIURIL) 25 MG tablet [Pharmacy Med Name: hydroCHLOROthiazide 25 MG Oral Tablet] 90 tablet 0    Sig: TAKE 1 TABLET BY MOUTH ONCE DAILY IN THE MORNING      Cardiovascular: Diuretics - Thiazide Failed - 01/04/2021 12:10 PM      Failed - Ca in normal range and within 360 days    Calcium  Date Value Ref Range Status  12/29/2018 9.9 8.7 - 10.2 mg/dL Final   Calcium, Ion  Date Value Ref Range Status  08/06/2017 1.18 1.15 - 1.40 mmol/L Final          Failed - Cr in normal range and within 360 days    Creatinine, Ser  Date Value Ref Range Status  12/29/2018 0.73 0.57 - 1.00 mg/dL Final          Failed - K in normal range and within 360 days    Potassium  Date Value Ref Range Status  12/29/2018 3.7 3.5 - 5.2 mmol/L Final          Failed - Na in normal range and within 360 days    Sodium  Date Value Ref Range Status  12/29/2018 139 134 - 144 mmol/L Final          Failed - Valid encounter within last 6 months    Recent Outpatient Visits           11 months ago Upper respiratory infection with cough and congestion   [facility] Kerin Perna, NP   1 year ago Tick bite, initial encounter   [place], [place], NP   1 year ago Chronic pain of both knees   [place] Kerin Perna, NP   1 year ago Hypertension, unspecified type   [place], Michelle P, NP   1 year ago Hypertension, unspecified type   [place] Kerin Perna, NP                Passed - Last BP in normal range    BP Readings from Last 1 Encounters:  07/03/20 128/89            amLODipine (NORVASC) 5 MG tablet [Pharmacy Med Name: amLODIPine Besylate 5 MG Oral  Tablet] 90 tablet 0    Sig: Take 1 tablet by mouth once daily      Cardiovascular:  Calcium Channel Blockers Failed - 01/04/2021 12:10 PM      Failed - Valid encounter within last 6 months    Recent Outpatient Visits           11 months ago Upper respiratory infection with cough and congestion   [facility] Kerin Perna, NP   1 year ago Tick bite, initial encounter   [place], [place], NP   1 year ago Chronic pain of both knees   [place] Kerin Perna, NP   1 year ago Hypertension, unspecified type   [place] Kerin Perna, NP   1 year ago Hypertension, unspecified type   [place] Kerin Perna, NP  Passed - Last BP in normal range    BP Readings from Last 1 Encounters:  07/03/20 128/89            albuterol (VENTOLIN HFA) 108 (90 Base) MCG/ACT inhaler [Pharmacy Med Name: Albuterol Sulfate HFA 108 (90 Base) MCG/ACT Inhalation Aerosol Solution] 9 g 0    Sig: INHALE 1 TO 2 PUFFS BY MOUTH EVERY 6 HOURS AS NEEDED FOR WHEEZING OR SHORTNESS OF BREATH      Pulmonology:  Beta Agonists Failed - 01/04/2021 12:10 PM      Failed - One inhaler should last at least one month. If the patient is requesting refills earlier, contact the patient to check for uncontrolled symptoms.      Passed - Valid encounter within last 12 months    Recent Outpatient Visits           11 months ago Upper respiratory infection with cough and congestion   [facility] Kerin Perna, NP   1 year ago Tick bite, initial encounter   [place], [place], NP   1 year ago Chronic pain of both knees   [place] Kerin Perna, NP   1 year ago Hypertension, unspecified type   [place] Kerin Perna, NP   1 year ago  Hypertension, unspecified type   [place] Kerin Perna, NP

## 2021-01-04 NOTE — Telephone Encounter (Signed)
Requested medication (s) are due for refill today: yes  Requested medication (s) are on the active medication list: yes  Last refill:  01/02/20 #90  Future visit scheduled: no  Notes to clinic:  Sent a message via MyChart to pt to make appt.   Requested Prescriptions  Pending Prescriptions Disp Refills   pravastatin (PRAVACHOL) 20 MG tablet [Pharmacy Med Name: Pravastatin Sodium 20 MG Oral Tablet] 90 tablet 0    Sig: Take 1 tablet by mouth once daily      Cardiovascular:  Antilipid - Statins Failed - 01/04/2021 12:10 PM      Failed - Total Cholesterol in normal range and within 360 days    Cholesterol, Total  Date Value Ref Range Status  12/29/2018 154 100 - 199 mg/dL Final          Failed - LDL in normal range and within 360 days    LDL Calculated  Date Value Ref Range Status  12/29/2018 70 0 - 99 mg/dL Final          Failed - HDL in normal range and within 360 days    HDL  Date Value Ref Range Status  12/29/2018 57 >39 mg/dL Final          Failed - Triglycerides in normal range and within 360 days    Triglycerides  Date Value Ref Range Status  12/29/2018 137 0 - 149 mg/dL Final          Passed - Patient is not pregnant      Passed - Valid encounter within last 12 months    Recent Outpatient Visits           11 months ago Upper respiratory infection with cough and congestion   [facility] Kerin Perna, NP   1 year ago Tick bite, initial encounter   [place], Michelle P, NP   1 year ago Chronic pain of both knees   [place] Kerin Perna, NP   1 year ago Hypertension, unspecified type   [place] Kerin Perna, NP   1 year ago Hypertension, unspecified type   [place] Kerin Perna, NP

## 2021-01-07 ENCOUNTER — Other Ambulatory Visit (INDEPENDENT_AMBULATORY_CARE_PROVIDER_SITE_OTHER): Payer: Self-pay | Admitting: Primary Care

## 2021-01-07 DIAGNOSIS — Z760 Encounter for issue of repeat prescription: Secondary | ICD-10-CM

## 2021-01-07 NOTE — Telephone Encounter (Signed)
Requested medications are due for refill today.  yes  Requested medications are on the active medications list.  yes  Last refill. 12/29/2019  Future visit scheduled.   yes  Notes to clinic.  Prescription is expired.

## 2021-01-09 NOTE — Telephone Encounter (Addendum)
Patient disappointed her blood pressure medication has not been filled. Patient would like a follow up call as soon as possible   [place] - [address]  [address] [facility] ([place]) [place] [address]  Phone:  [phone]  Fax:  [phone]

## 2021-01-10 ENCOUNTER — Other Ambulatory Visit (INDEPENDENT_AMBULATORY_CARE_PROVIDER_SITE_OTHER): Payer: Self-pay | Admitting: Primary Care

## 2021-01-10 DIAGNOSIS — I10 Essential (primary) hypertension: Secondary | ICD-10-CM

## 2021-01-10 NOTE — Telephone Encounter (Signed)
Patient has not ben seen in a year. Pt is under the care of [facility]. Refill will have to be approved by PCP if appropriate, or she'll have to schedule an appointment.

## 2021-01-10 NOTE — Telephone Encounter (Signed)
Per PCP, no refills until patient is seen.

## 2021-01-10 NOTE — Telephone Encounter (Signed)
Notes to clinic:  Patient has appt for 01/16/2021 Review for refill    Requested Prescriptions  Pending Prescriptions Disp Refills   amLODipine (NORVASC) 5 MG tablet [Pharmacy Med Name: amLODIPine Besylate 5 MG Oral Tablet] 90 tablet 0    Sig: Take 1 tablet by mouth once daily      Cardiovascular:  Calcium Channel Blockers Failed - 01/10/2021 11:12 AM      Failed - Valid encounter within last 6 months    Recent Outpatient Visits           11 months ago Upper respiratory infection with cough and congestion   [facility] Kerin Perna, NP   1 year ago Tick bite, initial encounter   [place] Kerin Perna, NP   1 year ago Chronic pain of both knees   [place] Kerin Perna, NP   1 year ago Hypertension, unspecified type   [place] Kerin Perna, NP   1 year ago Hypertension, unspecified type   [place], [place], NP       Future Appointments             In 6 days Kerin Perna, NP [place] - Last BP in normal range    BP Readings from Last 1 Encounters:  07/03/20 128/89            hydrochlorothiazide (HYDRODIURIL) 25 MG tablet [Pharmacy Med Name: hydroCHLOROthiazide 25 MG Oral Tablet] 90 tablet 0    Sig: TAKE 1 TABLET BY MOUTH ONCE DAILY IN THE MORNING      Cardiovascular: Diuretics - Thiazide Failed - 01/10/2021 11:12 AM      Failed - Ca in normal range and within 360 days    Calcium  Date Value Ref Range Status  12/29/2018 9.9 8.7 - 10.2 mg/dL Final   Calcium, Ion  Date Value Ref Range Status  08/06/2017 1.18 1.15 - 1.40 mmol/L Final          Failed - Cr in normal range and within 360 days    Creatinine, Ser  Date Value Ref Range Status  12/29/2018 0.73 0.57 - 1.00 mg/dL Final          Failed - K in normal range and within 360 days     Potassium  Date Value Ref Range Status  12/29/2018 3.7 3.5 - 5.2 mmol/L Final          Failed - Na in normal range and within 360 days    Sodium  Date Value Ref Range Status  12/29/2018 139 134 - 144 mmol/L Final          Failed - Valid encounter within last 6 months    Recent Outpatient Visits           11 months ago Upper respiratory infection with cough and congestion   [facility] Kerin Perna, NP   1 year ago Tick bite, initial encounter   [place], [place], NP   1 year ago Chronic pain of both knees   [place] Kerin Perna, NP   1 year ago Hypertension, unspecified type   [place] Kerin Perna, NP   1 year ago Hypertension, unspecified  type   [facility] Kerin Perna, NP       Future Appointments             In 6 days Kerin Perna, NP [place] BP in normal range    BP Readings from Last 1 Encounters:  07/03/20 128/89

## 2021-01-13 ENCOUNTER — Other Ambulatory Visit (INDEPENDENT_AMBULATORY_CARE_PROVIDER_SITE_OTHER): Payer: Self-pay | Admitting: Primary Care

## 2021-01-13 DIAGNOSIS — I10 Essential (primary) hypertension: Secondary | ICD-10-CM

## 2021-01-14 ENCOUNTER — Other Ambulatory Visit (INDEPENDENT_AMBULATORY_CARE_PROVIDER_SITE_OTHER): Payer: Self-pay | Admitting: Primary Care

## 2021-01-14 DIAGNOSIS — Z760 Encounter for issue of repeat prescription: Secondary | ICD-10-CM

## 2021-01-14 NOTE — Telephone Encounter (Signed)
Requested medication (s) are due for refill today:   Yes  Requested medication (s) are on the active medication list:   Yes  Future visit scheduled:   Yes on 01/16/2021   Last ordered: 12/29/2019 8 g, 2 refills  Returned for provider to review for refills since courtesy dose been given.   Has appt on 8/4   Requested Prescriptions  Pending Prescriptions Disp Refills   albuterol (VENTOLIN HFA) 108 (90 Base) MCG/ACT inhaler [Pharmacy Med Name: Albuterol Sulfate HFA 108 (90 Base) MCG/ACT Inhalation Aerosol Solution] 9 g 0    Sig: INHALE 1 TO 2 PUFFS BY MOUTH EVERY 6 HOURS AS NEEDED FOR WHEEZING OR SHORTNESS OF BREATH      Pulmonology:  Beta Agonists Failed - 01/14/2021 10:18 AM      Failed - One inhaler should last at least one month. If the patient is requesting refills earlier, contact the patient to check for uncontrolled symptoms.      Passed - Valid encounter within last 12 months    Recent Outpatient Visits           11 months ago Upper respiratory infection with cough and congestion   [facility] Kerin Perna, NP   1 year ago Tick bite, initial encounter   [place], Michelle P, NP   1 year ago Chronic pain of both knees   [place] Kerin Perna, NP   1 year ago Hypertension, unspecified type   [place] Kerin Perna, NP   1 year ago Hypertension, unspecified type   [place] Kerin Perna, NP       Future Appointments             In 2 days Kerin Perna, NP [place]

## 2021-01-16 ENCOUNTER — Encounter (INDEPENDENT_AMBULATORY_CARE_PROVIDER_SITE_OTHER): Payer: Self-pay | Admitting: Primary Care

## 2021-01-16 ENCOUNTER — Ambulatory Visit (INDEPENDENT_AMBULATORY_CARE_PROVIDER_SITE_OTHER): Payer: BC Managed Care – PPO | Admitting: Primary Care

## 2021-01-16 ENCOUNTER — Other Ambulatory Visit: Payer: Self-pay

## 2021-01-16 VITALS — BP 121/79 | HR 67 | Temp 97.5°F | Ht 65.5 in | Wt 265.4 lb

## 2021-01-16 DIAGNOSIS — F17210 Nicotine dependence, cigarettes, uncomplicated: Secondary | ICD-10-CM | POA: Diagnosis not present

## 2021-01-16 DIAGNOSIS — J9801 Acute bronchospasm: Secondary | ICD-10-CM

## 2021-01-16 DIAGNOSIS — I10 Essential (primary) hypertension: Secondary | ICD-10-CM

## 2021-01-16 DIAGNOSIS — Z23 Encounter for immunization: Secondary | ICD-10-CM

## 2021-01-16 DIAGNOSIS — F17200 Nicotine dependence, unspecified, uncomplicated: Secondary | ICD-10-CM

## 2021-01-16 DIAGNOSIS — Z760 Encounter for issue of repeat prescription: Secondary | ICD-10-CM | POA: Diagnosis not present

## 2021-01-16 MED ORDER — AMLODIPINE BESYLATE 5 MG PO TABS: 5.0000 mg | ORAL_TABLET | Freq: Every day | ORAL | 1 refills | Status: DC

## 2021-01-16 MED ORDER — PRAVASTATIN SODIUM 20 MG PO TABS: 20.0000 mg | ORAL_TABLET | Freq: Every day | ORAL | 0 refills | Status: DC

## 2021-01-16 MED ORDER — ALBUTEROL SULFATE HFA 108 (90 BASE) MCG/ACT IN AERS: INHALATION_SPRAY | RESPIRATORY_TRACT | 2 refills | Status: DC

## 2021-01-16 MED ORDER — HYDROCHLOROTHIAZIDE 25 MG PO TABS: ORAL_TABLET | ORAL | 0 refills | Status: DC

## 2021-01-20 NOTE — Progress Notes (Signed)
Established Patient Office Visit  Subjective:  Patient ID: Allison Mcintyre, female    DOB: [birth year]  Age: 60 y.o. MRN: [id number]  CC:  Chief Complaint  Patient presents with   Medication Refill   Hypertension    HPI Allison Mcintyre presents for management of hypertension and medication refill. Denies shortness of breath, headaches, chest pain or lower extremity edema   Past Medical History:  Diagnosis Date   Anemia    Hemorrhoids    Hyperlipidemia    Hypertension    Menorrhagia     Past Surgical History:  Procedure Laterality Date   ABDOMINAL HYSTERECTOMY  2003   CESAREAN SECTION     COLONOSCOPY     POLYPECTOMY      Family History  Problem Relation Age of Onset   Cancer Mother    Pancreatic cancer Mother    Bronchitis Father    Colon polyps Neg Hx    Esophageal cancer Neg Hx    Stomach cancer Neg Hx    Rectal cancer Neg Hx    Colon cancer Neg Hx     Social History   Socioeconomic History   Marital status: Single    Spouse name: Not on file   Number of children: Not on file   Years of education: Not on file   Highest education level: Not on file  Occupational History   Not on file  Tobacco Use   Smoking status: Every Day    Packs/day: 0.25    Types: Cigarettes   Smokeless tobacco: Never  Substance and Sexual Activity   Alcohol use: Yes    Alcohol/week: 3.0 standard drinks    Types: 3 Cans of beer per week    Comment: occasional , about weekly   Drug use: No   Sexual activity: Yes    Birth control/protection: Surgical  Other Topics Concern   Not on file  Social History Narrative   Not on file   Social Determinants of Health   Financial Resource Strain: Not on file  Food Insecurity: Not on file  Transportation Needs: Not on file  Physical Activity: Not on file  Stress: Not on file  Social Connections: Not on file  Intimate Partner Violence: Not on file    Outpatient Medications Prior to Visit  Medication Sig Dispense Refill    amLODipine (NORVASC) 5 MG tablet Take 1 tablet by mouth once daily 30 tablet 0   pravastatin (PRAVACHOL) 20 MG tablet Take 1 tablet by mouth once daily 90 tablet 0   meloxicam (MOBIC) 15 MG tablet Take 1 tablet (15 mg total) by mouth daily as needed. 30 tablet 0   albuterol (VENTOLIN HFA) 108 (90 Base) MCG/ACT inhaler INHALE 1 TO 2 PUFFS BY MOUTH EVERY 6 HOURS AS NEEDED FOR WHEEZING FOR SHORTNESS OF BREATH (Patient not taking: No sig reported) 8 g 2   chlorpheniramine-HYDROcodone (TUSSIONEX PENNKINETIC ER) 10-8 MG/5ML SUER Take 5 mLs by mouth at bedtime as needed for cough. (Patient not taking: Reported on 07/03/2020) 115 mL 0   guaiFENesin (ROBITUSSIN) 100 MG/5ML SOLN Take 5 mLs (100 mg total) by mouth every 4 (four) hours as needed for cough or to loosen phlegm. (Patient not taking: Reported on 07/03/2020) 236 mL 0   hydrochlorothiazide (HYDRODIURIL) 25 MG tablet TAKE 1 TABLET BY MOUTH ONCE DAILY IN THE MORNING (Patient not taking: Reported on 01/16/2021) 90 tablet 0   metroNIDAZOLE (FLAGYL) 500 MG tablet Take 1 tablet (500 mg total) by mouth 2 (two)  times daily. 14 tablet 0   No facility-administered medications prior to visit.    No Known Allergies  ROS Review of Systems  Respiratory:  Positive for cough, shortness of breath and wheezing.   All other systems reviewed and are negative.    Objective:    Wt Readings from Last 3 Encounters:  01/16/21 265 lb 6.4 oz (120.4 kg)  07/03/20 252 lb (114.3 kg)  04/24/20 265 lb (120.2 kg)   Physical Exam  General: Vital signs reviewed.  Patient is well-developed and well-nourished, morbid obese female in no acute distress and cooperative with exam.  Head: Normocephalic and atraumatic. Eyes: EOMI, conjunctivae normal, no scleral icterus.  Neck: Supple, trachea midline, normal ROM, no JVD, masses, thyromegaly, or carotid bruit present.  Cardiovascular: RRR, S1 normal, S2 normal, no murmurs, gallops, or rubs. Pulmonary/Chest: Clear to  auscultation bilaterally, no wheezes, rales, or rhonchi. Abdominal: Soft, non-tender, non-distended, BS +, no masses, organomegaly, or guarding present.  Musculoskeletal: No joint deformities, erythema, or stiffness, ROM full and nontender. Extremities: No lower extremity edema bilaterally,  pulses symmetric and intact bilaterally. No cyanosis or clubbing. Neurological: A&O x3, Strength is normal and symmetric bilaterally,Skin: Warm, dry and intact. No rashes or erythema. Psychiatric: Normal mood and affect. speech and behavior is normal. Cognition and memory are normal.     Health Maintenance Due  Topic Date Due   Pneumococcal Vaccine 58-61 Years old (1 - PCV) Never done   Fecal DNA (Cologuard)  Never done   Zoster Vaccines- Shingrix (1 of 2) Never done   COVID-19 Vaccine (4 - Booster for Pfizer series) 10/22/2020   INFLUENZA VACCINE  01/13/2021    There are no preventive care reminders to display for this patient.  Lab Results  Component Value Date   TSH 1.160 02/22/2018   Lab Results  Component Value Date   WBC 6.5 12/29/2018   HGB 12.3 12/29/2018   HCT 37.3 12/29/2018   MCV 95 12/29/2018   PLT 351 12/29/2018   Lab Results  Component Value Date   NA 139 12/29/2018   K 3.7 12/29/2018   CO2 21 12/29/2018   GLUCOSE 125 (H) 12/29/2018   BUN 17 12/29/2018   CREATININE 0.73 12/29/2018   BILITOT 0.3 12/29/2018   ALKPHOS 68 12/29/2018   AST 12 12/29/2018   ALT 15 12/29/2018   PROT 6.9 12/29/2018   ALBUMIN 4.1 12/29/2018   CALCIUM 9.9 12/29/2018   ANIONGAP 12 08/06/2017   GFR 89.00 03/14/2015   Lab Results  Component Value Date   CHOL 154 12/29/2018   Lab Results  Component Value Date   HDL 57 12/29/2018   Lab Results  Component Value Date   LDLCALC 70 12/29/2018   Lab Results  Component Value Date   TRIG 137 12/29/2018   Lab Results  Component Value Date   CHOLHDL 2.7 12/29/2018   No results found for: HGBA1C    Assessment & Plan:   Allison Mcintyre was  seen today for medication refill and hypertension.  Diagnoses and all orders for this visit:  Smoker - I have recommended complete cessation of tobacco use. I have discussed various options available for assistance with tobacco cessation including over the counter methods (Nicotine gum, patch and lozenges). We also discussed prescription options (Chantix, Nicotine Inhaler / Nasal Spray). The patient is not interested in pursuing any prescription tobacco cessation options at this time. - Patient declines at this time.   Hypertension, unspecified type Counseled on blood pressure goal of less  than 130/80, low-sodium, DASH diet, medication compliance, 150 minutes of moderate intensity exercise per week. Discussed medication compliance, adverse effects.  -     amLODipine (NORVASC) 5 MG tablet; Take 1 tablet (5 mg total) by mouth daily. -     hydrochlorothiazide (HYDRODIURIL) 25 MG tablet; TAKE 1 TABLET BY MOUTH ONCE DAILY IN THE MORNING  Medication refill -     amLODipine (NORVASC) 5 MG tablet; Take 1 tablet (5 mg total) by mouth daily. -     pravastatin (PRAVACHOL) 20 MG tablet; Take 1 tablet (20 mg total) by mouth daily. -     hydrochlorothiazide (HYDRODIURIL) 25 MG tablet; TAKE 1 TABLET BY MOUTH ONCE DAILY IN THE MORNING -     albuterol (VENTOLIN HFA) 108 (90 Base) MCG/ACT inhaler; INHALE 1 TO 2 PUFFS BY MOUTH EVERY 6 HOURS AS NEEDED FOR WHEEZING FOR SHORTNESS OF BREATH  Bronchospasm Albuterol hfa and smoking causes increase in flare ups  Need for vaccination against Streptococcus pneumoniae using pneumococcal conjugate vaccine 7 -     Pneumococcal conjugate vaccine 20-valent (Prevnar 20)     Meds ordered this encounter  Medications   amLODipine (NORVASC) 5 MG tablet    Sig: Take 1 tablet (5 mg total) by mouth daily.    Dispense:  90 tablet    Refill:  1    Must attend office visit 8/4 prior to further refills.   pravastatin (PRAVACHOL) 20 MG tablet    Sig: Take 1 tablet (20 mg  total) by mouth daily.    Dispense:  90 tablet    Refill:  0   hydrochlorothiazide (HYDRODIURIL) 25 MG tablet    Sig: TAKE 1 TABLET BY MOUTH ONCE DAILY IN THE MORNING    Dispense:  90 tablet    Refill:  0   albuterol (VENTOLIN HFA) 108 (90 Base) MCG/ACT inhaler    Sig: INHALE 1 TO 2 PUFFS BY MOUTH EVERY 6 HOURS AS NEEDED FOR WHEEZING FOR SHORTNESS OF BREATH    Dispense:  8 g    Refill:  2    Follow-up: Return in about 6 months (around 07/19/2021) for fasting labs.    Kerin Perna, NP

## 2021-02-12 ENCOUNTER — Ambulatory Visit: Payer: BC Managed Care – PPO

## 2021-02-23 ENCOUNTER — Encounter (HOSPITAL_COMMUNITY): Payer: Self-pay

## 2021-02-23 ENCOUNTER — Other Ambulatory Visit: Payer: Self-pay

## 2021-02-23 ENCOUNTER — Ambulatory Visit (HOSPITAL_COMMUNITY)
Admission: EM | Admit: 2021-02-23 | Discharge: 2021-02-23 | Disposition: A | Discharge status: Home / Self Care | Payer: BC Managed Care – PPO | Attending: Emergency Medicine | Admitting: Emergency Medicine

## 2021-02-23 DIAGNOSIS — J069 Acute upper respiratory infection, unspecified: Secondary | ICD-10-CM | POA: Diagnosis present

## 2021-02-23 DIAGNOSIS — J019 Acute sinusitis, unspecified: Secondary | ICD-10-CM | POA: Diagnosis not present

## 2021-02-23 DIAGNOSIS — Z20822 Contact with and (suspected) exposure to covid-19: Secondary | ICD-10-CM

## 2021-02-23 LAB — SARS CORONAVIRUS 2 (TAT 6-24 HRS): SARS Coronavirus 2: NEGATIVE

## 2021-02-23 MED ORDER — IBUPROFEN 600 MG PO TABS: 600.0000 mg | ORAL_TABLET | Freq: Four times a day (QID) | ORAL | 0 refills | Status: DC | PRN

## 2021-02-23 MED ORDER — FLUTICASONE PROPIONATE 50 MCG/ACT NA SUSP: 2.0000 | Freq: Every day | NASAL | 0 refills | Status: DC

## 2021-02-23 NOTE — Discharge Instructions (Addendum)
Start Mucinex to keep the mucous thin and to decongest you.   You may take 600 mg of motrin with 1000 mg of tylenol up to 3-4 times a day as needed for pain. This is an effective combination for pain.  Finish the Augmentin, even if you feel better.  Flonase will help with the nasal congestion.  Use a NeilMed sinus rinse with distilled water as often as you want to to reduce nasal congestion. Follow the directions on the box.   Go to www.goodrx.com to look up your medications. This will give you a list of where you can find your prescriptions at the most affordable prices. Or you can ask the pharmacist what the cash price is. This is frequently cheaper than going through insurance.

## 2021-02-23 NOTE — ED Triage Notes (Signed)
Pt presents with headache, dizziness, weakness and generalized body aches X 1 week.   States she has taken Motrin and has applied body freeze on her neck.

## 2021-02-23 NOTE — ED Provider Notes (Signed)
HPI  SUBJECTIVE:  Allison Mcintyre is a 60 y.o. female who presents with 1 week of headache, cervical lymphadenopathy, body aches, sneezing, sinus pain and pressure, nasal congestion, rhinorrhea, mild sore throat and a cough.  No fevers, upper dental pain, postnasal drip, wheezing, shortness of breath, nausea, vomiting, diarrhea, abdominal pain, rash.  No itchy, watering eyes.  She states that her left cheek was swollen earlier, but this has resolved.  She has been taking 400 mg of ibuprofen 3 times daily with improvement in her symptoms.  Symptoms are worse when the ibuprofen wears off.  She took ibuprofen within 6 hours of evaluation.  No known COVID exposure.  She got the COVID booster.  She states that she was getting better, then got worse.  She has a past medical history of hypertension.  No history of COVID, allergies.  [id number], Milford Cage, NP    Past Medical History:  Diagnosis Date   Anemia    Hemorrhoids    Hyperlipidemia    Hypertension    Menorrhagia     Past Surgical History:  Procedure Laterality Date   ABDOMINAL HYSTERECTOMY  2003   CESAREAN SECTION     COLONOSCOPY     POLYPECTOMY      Family History  Problem Relation Age of Onset   Cancer Mother    Pancreatic cancer Mother    Bronchitis Father    Colon polyps Neg Hx    Esophageal cancer Neg Hx    Stomach cancer Neg Hx    Rectal cancer Neg Hx    Colon cancer Neg Hx     Social History   Tobacco Use   Smoking status: Every Day    Packs/day: 0.25    Types: Cigarettes   Smokeless tobacco: Never  Substance Use Topics   Alcohol use: Yes    Alcohol/week: 3.0 standard drinks    Types: 3 Cans of beer per week    Comment: occasional , about weekly   Drug use: No    No current facility-administered medications for this encounter.  Current Outpatient Medications:    fluticasone (FLONASE) 50 MCG/ACT nasal spray, Place 2 sprays into both nostrils daily., Disp: 16 g, Rfl: 0   ibuprofen (ADVIL) 600 MG  tablet, Take 1 tablet (600 mg total) by mouth every 6 (six) hours as needed., Disp: 30 tablet, Rfl: 0   albuterol (VENTOLIN HFA) 108 (90 Base) MCG/ACT inhaler, INHALE 1 TO 2 PUFFS BY MOUTH EVERY 6 HOURS AS NEEDED FOR WHEEZING FOR SHORTNESS OF BREATH, Disp: 8 g, Rfl: 2   amLODipine (NORVASC) 5 MG tablet, Take 1 tablet (5 mg total) by mouth daily., Disp: 90 tablet, Rfl: 1   hydrochlorothiazide (HYDRODIURIL) 25 MG tablet, TAKE 1 TABLET BY MOUTH ONCE DAILY IN THE MORNING, Disp: 90 tablet, Rfl: 0   pravastatin (PRAVACHOL) 20 MG tablet, Take 1 tablet (20 mg total) by mouth daily., Disp: 90 tablet, Rfl: 0  No Known Allergies   ROS  As noted in HPI.   Physical Exam  BP 130/75   Pulse 70   Temp 98.1 F (36.7 C) (Oral)   Resp 19   SpO2 100%   Constitutional: Well developed, well nourished, no acute distress Eyes:  EOMI, conjunctiva normal bilaterally HENT: Normocephalic, atraumatic,mucus membranes moist.  Erythematous, swollen turbinates.  Positive  mucoid nasal congestion.  No frontal, maxillary sinus tenderness. No obvious postnasal drip.  Normal oropharynx. Neck: No cervical adenopathy.  No meningismus. Respiratory: Normal inspiratory effort, lungs clear bilaterally Cardiovascular:  Normal rate regular rhythm no murmurs rubs or gallops GI: nondistended skin: No rash, skin intact Musculoskeletal: no deformities Neurologic: Alert & oriented x 3, no focal neuro deficits Psychiatric: Speech and behavior appropriate   ED Course   Medications - No data to display  Orders Placed This Encounter  Procedures   SARS CORONAVIRUS 2 (TAT 6-24 HRS) Nasopharyngeal Nasopharyngeal Swab    Standing Status:   Standing    Number of Occurrences:   1    Order Specific Question:   Is this test for diagnosis or screening    Answer:   Diagnosis of ill patient    Order Specific Question:   Symptomatic for COVID-19 as defined by CDC    Answer:   Yes    Order Specific Question:   Date of Symptom Onset     Answer:   02/16/2021    Order Specific Question:   Hospitalized for COVID-19    Answer:   No    Order Specific Question:   Admitted to ICU for COVID-19    Answer:   No    Order Specific Question:   Previously tested for COVID-19    Answer:   No    Order Specific Question:   Resident in a congregate (group) care setting    Answer:   No    Order Specific Question:   Employed in healthcare setting    Answer:   No    Order Specific Question:   Pregnant    Answer:   No    Order Specific Question:   Has patient completed COVID vaccination(s) (2 doses of Pfizer/Moderna 1 dose of [employer])    Answer:   Yes    Order Specific Question:   Has patient completed COVID Booster / 3rd dose    Answer:   Yes    Results for orders placed or performed during the hospital encounter of 02/23/21 (from the past 24 hour(s))  SARS CORONAVIRUS 2 (TAT 6-24 HRS) Nasopharyngeal Nasopharyngeal Swab     Status: None   Collection Time: 02/23/21 12:06 PM   Specimen: Nasopharyngeal Swab  Result Value Ref Range   SARS Coronavirus 2 NEGATIVE NEGATIVE   No results found.  ED Clinical Impression  1. Acute non-recurrent sinusitis, unspecified location   2. Upper respiratory tract infection, unspecified type   3. Encounter for laboratory testing for COVID-19 virus      ED Assessment/Plan  Suspect viral URI with day secondary bacterial sinusitis due to the double sickening.  We will check a COVID even though she is out of the window for treatment.  Augmentin, Flonase, saline nasal irrigation, Tylenol/ibuprofen and start Mucinex.  Follow-up with PMD as needed.  COVID-negative.  Plan as above.  Discussed labs, iMDM, treatment plan with patient. patient agrees with plan.   Meds ordered this encounter  Medications   fluticasone (FLONASE) 50 MCG/ACT nasal spray    Sig: Place 2 sprays into both nostrils daily.    Dispense:  16 g    Refill:  0   ibuprofen (ADVIL) 600 MG tablet    Sig: Take 1 tablet (600 mg  total) by mouth every 6 (six) hours as needed.    Dispense:  30 tablet    Refill:  0      *This clinic note was created using [employer]. Therefore, there may be occasional mistakes despite careful proofreading.  ?    Melynda Ripple, MD 02/24/21 [phone]

## 2021-02-26 ENCOUNTER — Other Ambulatory Visit: Payer: Self-pay

## 2021-02-26 ENCOUNTER — Ambulatory Visit (INDEPENDENT_AMBULATORY_CARE_PROVIDER_SITE_OTHER): Payer: BC Managed Care – PPO | Admitting: Primary Care

## 2021-02-26 ENCOUNTER — Encounter (INDEPENDENT_AMBULATORY_CARE_PROVIDER_SITE_OTHER): Payer: Self-pay | Admitting: Primary Care

## 2021-02-26 VITALS — BP 104/70 | HR 74 | Temp 97.5°F | Ht 65.5 in

## 2021-02-26 DIAGNOSIS — J069 Acute upper respiratory infection, unspecified: Secondary | ICD-10-CM

## 2021-02-26 NOTE — Progress Notes (Signed)
[facility]   Acute Office Visit  Subjective:    Patient ID: Allison Mcintyre, female    DOB: January 04, 1961, 60 y.o.   MRN: [id number]  Chief Complaint  Patient presents with   URI    Wants abx     URI   [name] is a 60 year old female in today for URI. Seen at Urgent care. Explain viral can not be tx with antibiotics. She has a sore throat , fevers ( did not take temperature but felt body was hot) and decrease appetite . Past Medical History:  Diagnosis Date   Anemia    Hemorrhoids    Hyperlipidemia    Hypertension    Menorrhagia     Past Surgical History:  Procedure Laterality Date   ABDOMINAL HYSTERECTOMY  2003   CESAREAN SECTION     COLONOSCOPY     POLYPECTOMY      Family History  Problem Relation Age of Onset   Cancer Mother    Pancreatic cancer Mother    Bronchitis Father    Colon polyps Neg Hx    Esophageal cancer Neg Hx    Stomach cancer Neg Hx    Rectal cancer Neg Hx    Colon cancer Neg Hx     Social History   Socioeconomic History   Marital status: Single    Spouse name: Not on file   Number of children: Not on file   Years of education: Not on file   Highest education level: Not on file  Occupational History   Not on file  Tobacco Use   Smoking status: Every Day    Packs/day: 0.25    Types: Cigarettes   Smokeless tobacco: Never  Substance and Sexual Activity   Alcohol use: Yes    Alcohol/week: 3.0 standard drinks    Types: 3 Cans of beer per week    Comment: occasional , about weekly   Drug use: No   Sexual activity: Yes    Birth control/protection: Surgical  Other Topics Concern   Not on file  Social History Narrative   Not on file   Social Determinants of Health   Financial Resource Strain: Not on file  Food Insecurity: Not on file  Transportation Needs: Not on file  Physical Activity: Not on file  Stress: Not on file  Social Connections: Not on file  Intimate Partner Violence: Not on file     Outpatient Medications Prior to Visit  Medication Sig Dispense Refill   albuterol (VENTOLIN HFA) 108 (90 Base) MCG/ACT inhaler INHALE 1 TO 2 PUFFS BY MOUTH EVERY 6 HOURS AS NEEDED FOR WHEEZING FOR SHORTNESS OF BREATH 8 g 2   amLODipine (NORVASC) 5 MG tablet Take 1 tablet (5 mg total) by mouth daily. 90 tablet 1   fluticasone (FLONASE) 50 MCG/ACT nasal spray Place 2 sprays into both nostrils daily. 16 g 0   hydrochlorothiazide (HYDRODIURIL) 25 MG tablet TAKE 1 TABLET BY MOUTH ONCE DAILY IN THE MORNING 90 tablet 0   ibuprofen (ADVIL) 600 MG tablet Take 1 tablet (600 mg total) by mouth every 6 (six) hours as needed. 30 tablet 0   pravastatin (PRAVACHOL) 20 MG tablet Take 1 tablet (20 mg total) by mouth daily. 90 tablet 0   No facility-administered medications prior to visit.    No Known Allergies  Comprehensive ROS pertinent positives noted      Objective:    Physical Exam Vitals reviewed.  Constitutional:  Appearance: She is obese.  HENT:     Head: Normocephalic and atraumatic.     Right Ear: Tympanic membrane and external ear normal.     Left Ear: Tympanic membrane and external ear normal.     Nose: Nose normal.     Mouth/Throat:     Mouth: Mucous membranes are dry.     Pharynx: Oropharynx is clear.     Comments: No redness  Eyes:     Extraocular Movements: Extraocular movements intact.  Cardiovascular:     Rate and Rhythm: Normal rate and regular rhythm.  Pulmonary:     Effort: Pulmonary effort is normal.     Breath sounds: Normal breath sounds.  Abdominal:     General: Bowel sounds are normal. There is distension.     Palpations: Abdomen is soft.  Musculoskeletal:        General: Normal range of motion.     Cervical back: Normal range of motion and neck supple.  Skin:    General: Skin is warm and dry.  Neurological:     Mental Status: She is alert and oriented to person, place, and time.  Psychiatric:        Mood and Affect: Mood normal.        Behavior:  Behavior normal.        Thought Content: Thought content normal.        Judgment: Judgment normal.   BP 104/70 (BP Location: Right Arm, Patient Position: Sitting, Cuff Size: Large)   Pulse 74   Temp (!) 97.5 F (36.4 C) (Temporal)   Ht 5' 5.5" (1.664 m)   SpO2 98%   BMI 43.49 kg/m  Wt Readings from Last 3 Encounters:  01/16/21 265 lb 6.4 oz (120.4 kg)  07/03/20 252 lb (114.3 kg)  04/24/20 265 lb (120.2 kg)    Health Maintenance Due  Topic Date Due   Pneumococcal Vaccine 25-57 Years old (1 - PCV) Never done   Fecal DNA (Cologuard)  Never done   Zoster Vaccines- Shingrix (1 of 2) Never done   COVID-19 Vaccine (4 - Booster for Pfizer series) 10/22/2020   INFLUENZA VACCINE  01/13/2021    There are no preventive care reminders to display for this patient.   Lab Results  Component Value Date   TSH 1.160 02/22/2018   Lab Results  Component Value Date   WBC 6.5 12/29/2018   HGB 12.3 12/29/2018   HCT 37.3 12/29/2018   MCV 95 12/29/2018   PLT 351 12/29/2018   Lab Results  Component Value Date   NA 139 12/29/2018   K 3.7 12/29/2018   CO2 21 12/29/2018   GLUCOSE 125 (H) 12/29/2018   BUN 17 12/29/2018   CREATININE 0.73 12/29/2018   BILITOT 0.3 12/29/2018   ALKPHOS 68 12/29/2018   AST 12 12/29/2018   ALT 15 12/29/2018   PROT 6.9 12/29/2018   ALBUMIN 4.1 12/29/2018   CALCIUM 9.9 12/29/2018   ANIONGAP 12 08/06/2017   GFR 89.00 03/14/2015   Lab Results  Component Value Date   CHOL 154 12/29/2018   Lab Results  Component Value Date   HDL 57 12/29/2018   Lab Results  Component Value Date   LDLCALC 70 12/29/2018   Lab Results  Component Value Date   TRIG 137 12/29/2018   Lab Results  Component Value Date   CHOLHDL 2.7 12/29/2018   No results found for: HGBA1C     Assessment & Plan:  [name] was seen today for  uri.  Diagnoses and all orders for this visit:  Upper respiratory infection with cough and congestion Tx s/s OTC cough medication and  guaifenesin- fever/chills may alternate ibuprofen Q8 and tylenol Q4 hrs     No orders of the defined types were placed in this encounter.    Kerin Perna, NP

## 2021-02-26 NOTE — Patient Instructions (Signed)
Upper Respiratory Infection, Adult  An upper respiratory infection (URI) affects the nose, throat, and upper air passages. URIs are caused by germs (viruses). The most common type of URI is often called "the common cold."  Medicines cannot cure URIs, but you can do things at home to relieve your symptoms. URIs usually get better within 7-10 days.  Follow these instructions at home:  Activity  Rest as needed.  If you have a fever, stay home from work or school until your fever is gone, or until your doctor says you may return to work or school.  You should stay home until you cannot spread the infection anymore (you are not contagious).  Your doctor may have you wear a face mask so you have less risk of spreading the infection.  Relieving symptoms  Gargle with a salt-water mixture 3-4 times a day or as needed. To make a salt-water mixture, completely dissolve -1 tsp of salt in 1 cup of warm water.  Use a cool-mist humidifier to add moisture to the air. This can help you breathe more easily.  Eating and drinking    Drink enough fluid to keep your pee (urine) pale yellow.  Eat soups and other clear broths.  General instructions    Take over-the-counter and prescription medicines only as told by your doctor. These include cold medicines, fever reducers, and cough suppressants.  Do not use any products that contain nicotine or tobacco. These include cigarettes and e-cigarettes. If you need help quitting, ask your doctor.  Avoid being where people are smoking (avoid secondhand smoke).  Make sure you get regular shots and get the flu shot every year.  Keep all follow-up visits as told by your doctor. This is important.  How to avoid spreading infection to others    Wash your hands often with soap and water. If you do not have soap and water, use hand sanitizer.  Avoid touching your mouth, face, eyes, or nose.  Cough or sneeze into a tissue or your sleeve or elbow. Do not cough or sneeze into your hand or into the  air.  Contact a doctor if:  You are getting worse, not better.  You have any of these:  A fever.  Chills.  Brown or red mucus in your nose.  Yellow or brown fluid (discharge)coming from your nose.  Pain in your face, especially when you bend forward.  Swollen neck glands.  Pain with swallowing.  White areas in the back of your throat.  Get help right away if:  You have shortness of breath that gets worse.  You have very bad or constant:  Headache.  Ear pain.  Pain in your forehead, behind your eyes, and over your cheekbones (sinus pain).  Chest pain.  You have long-lasting (chronic) lung disease along with any of these:  Wheezing.  Long-lasting cough.  Coughing up blood.  A change in your usual mucus.  You have a stiff neck.  You have changes in your:  Vision.  Hearing.  Thinking.  Mood.  Summary  An upper respiratory infection (URI) is caused by a germ called a virus. The most common type of URI is often called "the common cold."  URIs usually get better within 7-10 days.  Take over-the-counter and prescription medicines only as told by your doctor.  This information is not intended to replace advice given to you by your health care provider. Make sure you discuss any questions you have with your health care provider.  Document 

## 2021-03-19 ENCOUNTER — Ambulatory Visit (INDEPENDENT_AMBULATORY_CARE_PROVIDER_SITE_OTHER): Payer: BC Managed Care – PPO | Admitting: Internal Medicine

## 2021-03-19 VITALS — BP 142/63 | HR 68 | Temp 98.6°F | Wt 264.2 lb

## 2021-03-19 DIAGNOSIS — I10 Essential (primary) hypertension: Secondary | ICD-10-CM | POA: Diagnosis not present

## 2021-03-19 DIAGNOSIS — Z0000 Encounter for general adult medical examination without abnormal findings: Secondary | ICD-10-CM | POA: Insufficient documentation

## 2021-03-19 DIAGNOSIS — Z760 Encounter for issue of repeat prescription: Secondary | ICD-10-CM

## 2021-03-19 DIAGNOSIS — K148 Other diseases of tongue: Secondary | ICD-10-CM | POA: Insufficient documentation

## 2021-03-19 DIAGNOSIS — M7661 Achilles tendinitis, right leg: Secondary | ICD-10-CM

## 2021-03-19 DIAGNOSIS — Z23 Encounter for immunization: Secondary | ICD-10-CM | POA: Diagnosis not present

## 2021-03-19 MED ORDER — DICLOFENAC SODIUM 1 % EX GEL
4.0000 g | Freq: Four times a day (QID) | CUTANEOUS | 1 refills | Status: DC
Start: 2021-03-19 — End: 2021-03-20

## 2021-03-19 MED ORDER — AMLODIPINE BESYLATE 10 MG PO TABS: 10.0000 mg | ORAL_TABLET | Freq: Every day | ORAL | 0 refills | Status: DC

## 2021-03-19 NOTE — Patient Instructions (Addendum)
Ms [facility],  It was a pleasure seeing you in clinic. Today we discussed:   Hypertension: At this time, please increase your amlodipine to 10mg  daily. Continue taking all other medications as prescribed. Please check your blood pressures at home and keep a log of this. Follow up in 2 weeks  Spot on tongue: Please continue to monitor this. If it becomes painful or larger, please let us know.   Heel pain: I suspect this is likely from inflammation around your tendon. You may try voltaren gel 4 times daily as needed. If no significant pain, please let us know.   If you have any questions or concerns, please call our clinic at [phone] between 9am-5pm and after hours call [phone] and ask for the internal medicine resident on call. If you feel you are having a medical emergency please call 911.   Thank you, we look forward to helping you remain healthy!  If you have not gotten the COVID vaccine, I recommend doing so:  You may get it at your local [facility] or [facility] OR To schedule an appointment for a COVID vaccine or be added to the vaccine wait list: Go to [email or web address]   OR Go to [email or web address]                  OR Call [phone]                                     OR Call [phone] and select Option 2

## 2021-03-19 NOTE — Assessment & Plan Note (Signed)
Patient notes having worsening right heel pain over the past several months. She works for [employer] and notes that it is more difficult to walk. She has a history of this in the past and was previously recommended for conservative management.  Plan: Voltaren gel 1% qid  F/u prn; if persistent pain, will consider referral to podiatry

## 2021-03-19 NOTE — Assessment & Plan Note (Signed)
BP Readings from Last 3 Encounters:  03/19/21 (!) 142/63  02/26/21 104/70  02/23/21 130/75   Patient has a history of hypertension for which she is on amlodipine 5mg  daily and HCTZ 25mg  daily. Patient reports medication adherence.  She has noted daily headaches and "floaters" in her vision which she attributes to her hypertension. She denies any chest pain, shortness of breath, or focal weakness. Unfortunately, she has not been able to check her BP at home.  Blood pressure noted to be 153/65 with repeat 142/63. Not currently at goal.  Plan: Increase amlodipine to 10mg  daily Continue HCTZ 25mg  daily Recommended to keep BP log at home  F/u in 2 weeks for BP check

## 2021-03-19 NOTE — Progress Notes (Signed)
New Patient Office Visit  Subjective:  Patient ID: Allison Mcintyre, female    DOB: March 27, 1961  Age: 60 y.o. MRN: [id number]  CC:  Chief Complaint  Patient presents with   Establish Care   spot on tongue    HPI Allison Mcintyre presents for evaluation of spot on her tongue, right heel pain and to establish care. Please see problem based charting for complete assessment and plan.  Past Medical History:  Diagnosis Date   Anemia    Hemorrhoids    Hyperlipidemia    Hypertension    Menorrhagia     Past Surgical History:  Procedure Laterality Date   ABDOMINAL HYSTERECTOMY  2003   CESAREAN SECTION     COLONOSCOPY     POLYPECTOMY      Family History  Problem Relation Age of Onset   Cancer Mother    Pancreatic cancer Mother    Bronchitis Father    Colon polyps Neg Hx    Esophageal cancer Neg Hx    Stomach cancer Neg Hx    Rectal cancer Neg Hx    Colon cancer Neg Hx     Social History   Socioeconomic History   Marital status: Single    Spouse name: Not on file   Number of children: Not on file   Years of education: Not on file   Highest education level: Not on file  Occupational History   Not on file  Tobacco Use   Smoking status: Every Day    Packs/day: 0.25    Types: Cigarettes   Smokeless tobacco: Never  Substance and Sexual Activity   Alcohol use: Yes    Alcohol/week: 3.0 standard drinks    Types: 3 Cans of beer per week    Comment: occasional , about weekly   Drug use: No   Sexual activity: Yes    Birth control/protection: Surgical  Other Topics Concern   Not on file  Social History Narrative   Not on file   Social Determinants of Health   Financial Resource Strain: Not on file  Food Insecurity: Not on file  Transportation Needs: Not on file  Physical Activity: Not on file  Stress: Not on file  Social Connections: Not on file  Intimate Partner Violence: Not on file    ROS Negative except as stated in HPI.  Objective:   Today's  Vitals: BP (!) 142/63 (BP Location: Left Arm, Patient Position: Sitting, Cuff Size: Normal)   Pulse 68   Temp 98.6 F (37 C) (Oral)   Wt 264 lb 3.2 oz (119.8 kg)   SpO2 100%   BMI 43.30 kg/m  Physical Exam  Constitutional: Appears well-developed and well-nourished. No distress.  HENT: Normocephalic and atraumatic, EOMI, conjunctiva normal, moist mucous membranes; round lesion on mid/right side of tongue that is darker in color without raised or irregular borders  Cardiovascular: Normal rate, regular rhythm, S1 and S2 present, no murmurs, rubs, gallops.  Distal pulses intact Respiratory: No respiratory distress,Lungs are clear to auscultation bilaterally. Musculoskeletal: Normal bulk and tone.  No peripheral edema noted. Tenderness over right achilles tendon Skin: Warm and dry.  No rash, erythema, lesions noted. Psychiatric: Normal mood and affect. Behavior is normal. Judgment and thought content normal.    Assessment & Plan:   Problem List Items Addressed This Visit       Cardiovascular and Mediastinum   Hypertension    BP Readings from Last 3 Encounters:  03/19/21 (!) 142/63  02/26/21 104/70  02/23/21 130/75  Patient has a history of hypertension for which she is on amlodipine 5mg  daily and HCTZ 25mg  daily. Patient reports medication adherence.  She has noted daily headaches and "floaters" in her vision which she attributes to her hypertension. She denies any chest pain, shortness of breath, or focal weakness. Unfortunately, she has not been able to check her BP at home.  Blood pressure noted to be 153/65 with repeat 142/63. Not currently at goal.  Plan: Increase amlodipine to 10mg  daily Continue HCTZ 25mg  daily Recommended to keep BP log at home  F/u in 2 weeks for BP check       Relevant Medications   amLODipine (NORVASC) 10 MG tablet     Musculoskeletal and Integument   Achilles tendonitis    Patient notes having worsening right heel pain over the past several months.  She works for [employer] and notes that it is more difficult to walk. She has a history of this in the past and was previously recommended for conservative management.  Plan: Voltaren gel 1% qid  F/u prn; if persistent pain, will consider referral to podiatry         Other   Tongue lesion    Patient reports having a tongue lesion for several weeks now. She notes that this is nonpainful and denies any inciting factors.  This was previously evaluated by her dentist and recommended for close observation. Since then, she notes that it has slightly improved. On exam, round lesion in the mid/right aspect of her tongue without raised or irregular borders. Slightly darker in color.  She does have a history of tobacco use since age [age] with smoking at most 1/2 ppd; currently smokes 6 cigarettes daily. This does increase risk of carcinoma; however, appearance of lesion is benign at this time.   Plan: Continue close monitoring; if progressive in size or appearance, recommend referral to oral surgery for biopsy      Healthcare maintenance    Flu shot administered at this visit      Other Visit Diagnoses     Need for immunization against influenza    -  Primary   Relevant Orders   Flu Vaccine QUAD 47mo+IM (Fluarix, Fluzone & Alfiuria Quad PF) (Completed)   Medication refill       Relevant Medications   amLODipine (NORVASC) 10 MG tablet       Outpatient Encounter Medications as of 03/19/2021  Medication Sig   diclofenac Sodium (VOLTAREN) 1 % GEL Apply 4 g topically 4 (four) times daily.   albuterol (VENTOLIN HFA) 108 (90 Base) MCG/ACT inhaler INHALE 1 TO 2 PUFFS BY MOUTH EVERY 6 HOURS AS NEEDED FOR WHEEZING FOR SHORTNESS OF BREATH   amLODipine (NORVASC) 10 MG tablet Take 1 tablet (10 mg total) by mouth daily.   fluticasone (FLONASE) 50 MCG/ACT nasal spray Place 2 sprays into both nostrils daily.   hydrochlorothiazide (HYDRODIURIL) 25 MG tablet TAKE 1 TABLET BY MOUTH ONCE  DAILY IN THE MORNING   ibuprofen (ADVIL) 600 MG tablet Take 1 tablet (600 mg total) by mouth every 6 (six) hours as needed.   pravastatin (PRAVACHOL) 20 MG tablet Take 1 tablet (20 mg total) by mouth daily.   [DISCONTINUED] amLODipine (NORVASC) 5 MG tablet Take 1 tablet (5 mg total) by mouth daily.   No facility-administered encounter medications on file as of 03/19/2021.    Follow-up: Return in 2 weeks (on 04/02/2021), or if symptoms worsen or fail to improve.   Loralyn Freshwater  Marva Panda, MD

## 2021-03-19 NOTE — Assessment & Plan Note (Signed)
Patient reports having a tongue lesion for several weeks now. She notes that this is nonpainful and denies any inciting factors.  This was previously evaluated by her dentist and recommended for close observation. Since then, she notes that it has slightly improved. On exam, round lesion in the mid/right aspect of her tongue without raised or irregular borders. Slightly darker in color.  She does have a history of tobacco use since age [age] with smoking at most 1/2 ppd; currently smokes 6 cigarettes daily. This does increase risk of carcinoma; however, appearance of lesion is benign at this time.   Plan: Continue close monitoring; if progressive in size or appearance, recommend referral to oral surgery for biopsy

## 2021-03-19 NOTE — Assessment & Plan Note (Signed)
Flu shot administered at this visit

## 2021-03-20 ENCOUNTER — Other Ambulatory Visit: Payer: Self-pay | Admitting: *Deleted

## 2021-03-20 DIAGNOSIS — I10 Essential (primary) hypertension: Secondary | ICD-10-CM

## 2021-03-20 DIAGNOSIS — Z760 Encounter for issue of repeat prescription: Secondary | ICD-10-CM

## 2021-03-20 MED ORDER — AMLODIPINE BESYLATE 10 MG PO TABS: 10.0000 mg | ORAL_TABLET | Freq: Every day | ORAL | 0 refills | Status: DC

## 2021-03-20 MED ORDER — DICLOFENAC SODIUM 1 % EX GEL: 4.0000 g | Freq: Four times a day (QID) | CUTANEOUS | 1 refills | Status: DC

## 2021-03-20 NOTE — Telephone Encounter (Signed)
Call from pt stating she was seen yesterday; states Amlodipine and Diclofenac gel went to [facility]. States her pharmacy is [facility]. [facility] removed fom her chart as requested.

## 2021-03-20 NOTE — Progress Notes (Signed)
Internal Medicine Clinic Attending ° °Case discussed with Dr. Aslam  At the time of the visit.  We reviewed the resident’s history and exam and pertinent patient test results.  I agree with the assessment, diagnosis, and plan of care documented in the resident’s note.  °

## 2021-03-24 ENCOUNTER — Ambulatory Visit: Payer: BC Managed Care – PPO

## 2021-03-28 ENCOUNTER — Other Ambulatory Visit (INDEPENDENT_AMBULATORY_CARE_PROVIDER_SITE_OTHER): Payer: Self-pay | Admitting: Primary Care

## 2021-03-28 DIAGNOSIS — Z760 Encounter for issue of repeat prescription: Secondary | ICD-10-CM

## 2021-04-07 ENCOUNTER — Encounter: Payer: BC Managed Care – PPO | Admitting: Internal Medicine

## 2021-04-09 ENCOUNTER — Other Ambulatory Visit: Payer: Self-pay

## 2021-04-09 ENCOUNTER — Encounter: Payer: Self-pay | Admitting: Internal Medicine

## 2021-04-09 ENCOUNTER — Ambulatory Visit (INDEPENDENT_AMBULATORY_CARE_PROVIDER_SITE_OTHER): Payer: BC Managed Care – PPO | Admitting: Internal Medicine

## 2021-04-09 DIAGNOSIS — I10 Essential (primary) hypertension: Secondary | ICD-10-CM

## 2021-04-09 DIAGNOSIS — Z720 Tobacco use: Secondary | ICD-10-CM | POA: Diagnosis not present

## 2021-04-09 DIAGNOSIS — K148 Other diseases of tongue: Secondary | ICD-10-CM

## 2021-04-09 DIAGNOSIS — M7661 Achilles tendinitis, right leg: Secondary | ICD-10-CM

## 2021-04-09 MED ORDER — AMLODIPINE BESYLATE 5 MG PO TABS: 5.0000 mg | ORAL_TABLET | Freq: Every day | ORAL | 3 refills | Status: DC

## 2021-04-09 MED ORDER — CHLORTHALIDONE 25 MG PO TABS: 25.0000 mg | ORAL_TABLET | Freq: Every day | ORAL | 3 refills | Status: DC

## 2021-04-09 MED ORDER — NICOTINE POLACRILEX 2 MG MT LOZG: 2.0000 mg | LOZENGE | OROMUCOSAL | 0 refills | Status: DC | PRN

## 2021-04-09 NOTE — Assessment & Plan Note (Signed)
Patient's current blood pressure today was at goal 137/62.  During prior visit amlodipine was increased from 5 mg to 10 mg daily.  And patient continued to take hydrochlorothiazide 25 mg daily as directed.  However today patient complains of lower extremity edema with fluid around her ankles specifically.  This could be induced by the increased amlodipine dosage.  Patient expressed interest in wanting to increase her diuretic.  However increasing hydrochlorothiazide could lead to electrolyte imbalance. Patient was advised to wear compression socks to reduce swelling.  Patient admits to eating a salty diet.  Patient was advised to avoid salty foods as it can contribute to swelling.  PLAN: Decrease amlodipine from 10 mg to 5 mg daily Discontinue hydrochlorothiazide 25 mg Started on chlorthalidone 25 mg daily

## 2021-04-09 NOTE — Assessment & Plan Note (Signed)
Patient has a history of cigarette smoking since the age of 28; patient smokes half a pack of cigarettes daily.  Recently she has been cutting back and reports smoking 3 to 4 cigarettes/day.  Patient expressed interest in smoking cessation.  Patient was counseled extensively on the risks of cigarette smoking and options for smoke cessation.  Discussion was had about nicotine patches versus nicotine gum versus nicotine lozenges and oral medications.  Patient is agreeable to nicotine lozenges.   PLAN: Prescribed nicotine lozenges

## 2021-04-09 NOTE — Assessment & Plan Note (Signed)
On prior visit patient was prescribed Voltaren gel for pain relief.  However patient stated that she was unable to pick up this medication from the pharmacy.  Patient denies worsening of the pain since prior visit; but pain is still present.  Patient does stretching of the Achilles tendon which provides partial relief.  Patient declines referral to podiatry at this time.   PLAN: Recommended Voltaren gel over-the-counter; printed image of diclofenac gel over-the-counter that was given to the patient so she can locate it at her local drugstore.

## 2021-04-09 NOTE — Assessment & Plan Note (Signed)
Patient no longer complains of tongue lesion; patient says it has since resolved.  Denies any pain.  On physical exam, no lesion or discoloration noted of the time.

## 2021-04-09 NOTE — Patient Instructions (Signed)
Please start taking amlodipine 5mg  daily   Please stop taking the hydrochlorothiazide   I have prescribed Chlorthalidone, a new water pill that will help with the swelling and help improve your blood pressure    I have sent a 90 supply refill of these medications   Please find you work note and smoke cessation letter attached

## 2021-04-15 ENCOUNTER — Encounter: Payer: Self-pay | Admitting: Internal Medicine

## 2021-04-15 NOTE — Progress Notes (Signed)
CC: Follow up- BP check; ankle pain, smoke cessation and LEE  HPI:  [name] is a 60 y.o. female with a past medical history stated below and presents today for cc listed above. Please see problem based assessment and plan for additional details.  Past Medical History:  Diagnosis Date   Anemia    Hemorrhoids    Hyperlipidemia    Hypertension    Menorrhagia     Current Outpatient Medications on File Prior to Visit  Medication Sig Dispense Refill   albuterol (VENTOLIN HFA) 108 (90 Base) MCG/ACT inhaler INHALE 1 TO 2 PUFFS BY MOUTH EVERY 6 HOURS AS NEEDED FOR WHEEZING FOR SHORTNESS OF BREATH 8.5 g 0   diclofenac Sodium (VOLTAREN) 1 % GEL Apply 4 g topically 4 (four) times daily. 100 g 1   fluticasone (FLONASE) 50 MCG/ACT nasal spray Place 2 sprays into both nostrils daily. 16 g 0   ibuprofen (ADVIL) 600 MG tablet Take 1 tablet (600 mg total) by mouth every 6 (six) hours as needed. 30 tablet 0   pravastatin (PRAVACHOL) 20 MG tablet Take 1 tablet (20 mg total) by mouth daily. 90 tablet 0   No current facility-administered medications on file prior to visit.    Family History  Problem Relation Age of Onset   Cancer Mother    Pancreatic cancer Mother    Bronchitis Father    Colon polyps Neg Hx    Esophageal cancer Neg Hx    Stomach cancer Neg Hx    Rectal cancer Neg Hx    Colon cancer Neg Hx     Social History   Socioeconomic History   Marital status: Single    Spouse name: Not on file   Number of children: Not on file   Years of education: Not on file   Highest education level: Not on file  Occupational History   Not on file  Tobacco Use   Smoking status: Every Day    Packs/day: 0.25    Types: Cigarettes   Smokeless tobacco: Never   Tobacco comments:    3-4 per day  Substance and Sexual Activity   Alcohol use: Yes    Alcohol/week: 3.0 standard drinks    Types: 3 Cans of beer per week    Comment: occasional , about weekly   Drug use: No   Sexual  activity: Yes    Birth control/protection: Surgical  Other Topics Concern   Not on file  Social History Narrative   Not on file   Social Determinants of Health   Financial Resource Strain: Not on file  Food Insecurity: Not on file  Transportation Needs: Not on file  Physical Activity: Not on file  Stress: Not on file  Social Connections: Not on file  Intimate Partner Violence: Not on file    Review of Systems: ROS negative except for what is noted on the assessment and plan.  Vitals:   04/09/21 1540  BP: 137/62  Pulse: 69  Resp: (!) 24  Temp: 98.4 F (36.9 C)  TempSrc: Oral  SpO2: 99%  Weight: 273 lb 6.4 oz (124 kg)  Height: 5' 5.5" (1.664 m)     Physical Exam: Constitutional: well-appearing obese woman sitting in the chair, in no acute distress HENT: normocephalic atraumatic, mucous membranes moist Eyes: conjunctiva non-erythematous Neck: supple Cardiovascular: regular rate and rhythm, no m/r/g; 2+ nonpitting bilateral lower extremity edema Pulmonary/Chest: normal work of breathing on room air, lungs clear to auscultation bilaterally Abdominal: soft, non-tender,  non-distended MSK: normal bulk and tone Neurological: alert & oriented x 3, 5/5 strength in bilateral upper and lower extremities, normal gait Skin: warm and dry Psych: normal behavior    Assessment & Plan:   See Encounters Tab for problem based charting.  Patient seen with Dr. Jadene Pierini, M.D. [facility], PGY-1 Pager: [phone], Phone: [phone] Date 04/15/2021 Time 1:33 PM

## 2021-04-15 NOTE — Progress Notes (Signed)
Internal Medicine Clinic Attending  I saw and evaluated the patient.  I personally confirmed the key portions of the history and exam documented by Dr. Ariwodo and I reviewed pertinent patient test results.  The assessment, diagnosis, and plan were formulated together and I agree with the documentation in the resident's note.   

## 2021-04-16 ENCOUNTER — Telehealth: Payer: Self-pay

## 2021-04-16 NOTE — Telephone Encounter (Signed)
PA  for pt ( DICLOFENAC SOD 1% GEL ) came through on cover my meds was done and sent back through with office notes from 10/5 .Marland Kitchen Awaiting approval or denial

## 2021-04-17 NOTE — Telephone Encounter (Signed)
DECISION :    Approved today   Your PA request has been approved.   Additional information will be provided in the approval communication. (Message [id number])..   APPROVED  04/17/2021 to 04/17/2024    ( [place] )

## 2021-04-22 ENCOUNTER — Ambulatory Visit
Admission: RE | Admit: 2021-04-22 | Discharge: 2021-04-22 | Disposition: A | Discharge status: Home / Self Care | Payer: BC Managed Care – PPO | Source: Ambulatory Visit | Attending: Certified Nurse Midwife | Admitting: Certified Nurse Midwife

## 2021-04-22 DIAGNOSIS — Z01419 Encounter for gynecological examination (general) (routine) without abnormal findings: Secondary | ICD-10-CM

## 2021-04-27 ENCOUNTER — Ambulatory Visit (HOSPITAL_COMMUNITY)
Admission: EM | Admit: 2021-04-27 | Discharge: 2021-04-27 | Disposition: A | Discharge status: Home / Self Care | Payer: BC Managed Care – PPO | Attending: Emergency Medicine | Admitting: Emergency Medicine

## 2021-04-27 ENCOUNTER — Encounter (HOSPITAL_COMMUNITY): Payer: Self-pay

## 2021-04-27 ENCOUNTER — Other Ambulatory Visit: Payer: Self-pay

## 2021-04-27 DIAGNOSIS — R0981 Nasal congestion: Secondary | ICD-10-CM | POA: Diagnosis not present

## 2021-04-27 DIAGNOSIS — M7661 Achilles tendinitis, right leg: Secondary | ICD-10-CM

## 2021-04-27 DIAGNOSIS — R062 Wheezing: Secondary | ICD-10-CM

## 2021-04-27 MED ORDER — KETOROLAC TROMETHAMINE 30 MG/ML IJ SOLN
30.0000 mg | Freq: Once | INTRAMUSCULAR | Status: AC
  Administered 2021-04-27: 30 mg via INTRAMUSCULAR

## 2021-04-27 MED ORDER — KETOROLAC TROMETHAMINE 30 MG/ML IJ SOLN
INTRAMUSCULAR | Status: AC
  Filled 2021-04-27: qty 1

## 2021-04-27 MED ORDER — BUDESONIDE-FORMOTEROL FUMARATE 80-4.5 MCG/ACT IN AERO: 2.0000 | INHALATION_SPRAY | Freq: Every day | RESPIRATORY_TRACT | 12 refills | Status: DC

## 2021-04-27 MED ORDER — PREDNISONE 20 MG PO TABS: 40.0000 mg | ORAL_TABLET | Freq: Every day | ORAL | 0 refills | Status: DC

## 2021-04-27 NOTE — ED Triage Notes (Signed)
Pt presents with congestion & wheezing X 3 days.  Pt also presents with recurrent right foot pain with no known injury.

## 2021-04-27 NOTE — Discharge Instructions (Signed)
For your foot and your lungs you are being prescribed a steroid which should help to reduce irritation and inflammation  Use the steroid every morning with food for 5 days, while using this medication please avoid use of ibuprofen, Advil, Aleve, naproxen, if needing additional relief while on medication please use Tylenol  After completion of prednisone you may begin using ibuprofen as needed  It may be helpful if you attempt to change shoes that you were can to reduce flareups  Try to stretch legs daily, wear thickening comfy socks and may need to add additional support into shoes such as Dr. Felicie Morn pads  Can follow-up with orthopedic if having continued tendinitis flareups for further evaluation, information listed below  For your lungs if you are having consistent wheezing and congestion you will need to be evaluated by your primary doctor for COPD since you are a smoker  As always as it is recommended that you stop smoking as this will further irritate your lungs and symptoms  You may use Symbicort inhaler daily to help prevent flareups, continue to use your albuterol inhaler as needed

## 2021-04-27 NOTE — ED Provider Notes (Signed)
[place]    CSN: [id number] Arrival date & time: 04/27/21  1003      History   Chief Complaint Chief Complaint  Patient presents with   URI   Foot Pain    HPI Allison Mcintyre is a 60 y.o. female.   Patient presents with flareup of her tendinitis of right lower extremity.  Pain is felt most prominently in her right heel and extends up the posterior aspect of her leg. endorses that this is an ongoing problem and became flared while at work.  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  Painful to bear weight.  Range of motion intact but elicits pain.  Denies numbness or tingling, prior injury or trauma.  Has attempted use of diclofenac gel with no relief.  Endorses 800 mg of ibuprofen has been helpful.   Patient presents with ongoing nasal congestion, rhinorrhea and intermittent wheezing predominantly at nighttime.  Daily smoker.  No known sick contact.  Recent upper respiratory infection.  Has attempted use of Flonase and has used albuterol inhaler sporadically.  Tolerating food and liquids.  Denies fever, chills, body aches, abdominal pain, nausea, vomiting, diarrhea, chest pain or tightness, shortness of breath, sore throat.  History of anemia, hyperlipidemia,.    Past Medical History:  Diagnosis Date   Anemia    Hemorrhoids    Hyperlipidemia    Hypertension    Menorrhagia     Patient Active Problem List   Diagnosis Date Noted   Tobacco use 04/09/2021   Tongue lesion 03/19/2021   Healthcare maintenance 03/19/2021   Mild peripheral edema 11/19/2016   Other fatigue 11/19/2016   Daytime somnolence 11/19/2016   Anemia 04/03/2015   Palpitations 10/04/2014   Venous (peripheral) insufficiency 10/04/2014   Mass of right thigh 12/11/2013   Hypertension 12/11/2013   Achilles tendonitis 04/21/2013    Past Surgical History:  Procedure Laterality Date   ABDOMINAL HYSTERECTOMY  2003   CESAREAN SECTION     COLONOSCOPY     POLYPECTOMY      OB History      Gravida  4   Para  1   Term  1   Preterm      AB  3   Living         SAB      IAB  3   Ectopic      Multiple      Live Births               Home Medications    Prior to Admission medications   Medication Sig Start Date End Date Taking? Authorizing Provider  albuterol (VENTOLIN HFA) 108 (90 Base) MCG/ACT inhaler INHALE 1 TO 2 PUFFS BY MOUTH EVERY 6 HOURS AS NEEDED FOR WHEEZING FOR SHORTNESS OF BREATH 03/28/21   Kerin Perna, NP  amLODipine (NORVASC) 5 MG tablet Take 1 tablet (5 mg total) by mouth daily. 04/09/21 04/09/22  Timothy Lasso, MD  chlorthalidone (HYGROTON) 25 MG tablet Take 1 tablet (25 mg total) by mouth daily. 04/09/21 04/09/22  Timothy Lasso, MD  diclofenac Sodium (VOLTAREN) 1 % GEL Apply 4 g topically 4 (four) times daily. 03/20/21   Harvie Heck, MD  fluticasone (FLONASE) 50 MCG/ACT nasal spray Place 2 sprays into both nostrils daily. 02/23/21   Melynda Ripple, MD  ibuprofen (ADVIL) 600 MG tablet Take 1 tablet (600 mg total) by mouth every 6 (six) hours as needed. 02/23/21   Melynda Ripple, MD  nicotine polacrilex (  NICOTINE MINI) 2 MG lozenge Take 1 lozenge (2 mg total) by mouth as needed for smoking cessation. 04/09/21 09/24/21  Timothy Lasso, MD  pravastatin (PRAVACHOL) 20 MG tablet Take 1 tablet (20 mg total) by mouth daily. 01/16/21   Kerin Perna, NP    Family History Family History  Problem Relation Age of Onset   Cancer Mother    Pancreatic cancer Mother    Bronchitis Father    Colon polyps Neg Hx    Esophageal cancer Neg Hx    Stomach cancer Neg Hx    Rectal cancer Neg Hx    Colon cancer Neg Hx    Breast cancer Neg Hx     Social History Social History   Tobacco Use   Smoking status: Every Day    Packs/day: 0.25    Types: Cigarettes   Smokeless tobacco: Never   Tobacco comments:    3-4 per day  Substance Use Topics   Alcohol use: Yes    Alcohol/week: 3.0 standard drinks    Types: 3 Cans of beer  per week    Comment: occasional , about weekly   Drug use: No     Allergies   Patient has no known allergies.   Review of Systems Review of Systems  Constitutional: Negative.   HENT:  Positive for congestion and rhinorrhea. Negative for dental problem, drooling, ear discharge, ear pain, facial swelling, hearing loss, mouth sores, nosebleeds, postnasal drip, sinus pressure, sinus pain, sneezing, sore throat, tinnitus, trouble swallowing and voice change.   Respiratory:  Positive for wheezing. Negative for apnea, cough, choking, chest tightness, shortness of breath and stridor.   Gastrointestinal: Negative.   Musculoskeletal: Negative.   Skin: Negative.   Neurological: Negative.     Physical Exam Triage Vital Signs ED Triage Vitals  Enc Vitals Group     BP 04/27/21 1018 (!) 161/79     Pulse Rate 04/27/21 1018 81     Resp --      Temp 04/27/21 1018 99.1 F (37.3 C)     Temp Source 04/27/21 1018 Oral     SpO2 04/27/21 1018 95 %     Weight --      Height --      Head Circumference --      Peak Flow --      Pain Score 04/27/21 1023 6     Pain Loc --      Pain Edu? --      Excl. in [place]? --    No data found.  Updated Vital Signs BP (!) 161/79 (BP Location: Right Arm)   Pulse 81   Temp 99.1 F (37.3 C) (Oral)   SpO2 95%   Visual Acuity Right Eye Distance:   Left Eye Distance:   Bilateral Distance:    Right Eye Near:   Left Eye Near:    Bilateral Near:     Physical Exam Constitutional:      Appearance: Normal appearance. She is normal weight.  HENT:     Head: Normocephalic.     Right Ear: Tympanic membrane, ear canal and external ear normal.     Left Ear: Tympanic membrane, ear canal and external ear normal.     Nose: Congestion and rhinorrhea present.     Mouth/Throat:     Mouth: Mucous membranes are moist.     Pharynx: Oropharynx is clear.  Eyes:     Extraocular Movements: Extraocular movements intact.  Cardiovascular:     Rate and  Rhythm: Normal rate  and regular rhythm.     Pulses: Normal pulses.     Heart sounds: Normal heart sounds.  Pulmonary:     Effort: Pulmonary effort is normal.     Breath sounds: Normal breath sounds.  Musculoskeletal:     Cervical back: Normal range of motion and neck supple.     Right ankle:     Right Achilles Tendon: Tenderness present. No defects. Thompson's test negative.     Left ankle:     Left Achilles Tendon: Normal.     Right foot: Normal.     Left foot: Normal.  Skin:    General: Skin is warm and dry.  Neurological:     Mental Status: She is alert and oriented to person, place, and time. Mental status is at baseline.  Psychiatric:        Mood and Affect: Mood normal.        Behavior: Behavior normal.     UC Treatments / Results  Labs (all labs ordered are listed, but only abnormal results are displayed) Labs Reviewed - No data to display  EKG   Radiology No results found.  Procedures Procedures (including critical care time)  Medications Ordered in UC Medications - No data to display  Initial Impression / Assessment and Plan / UC Course  I have reviewed the triage vital signs and the nursing notes.  Pertinent labs & imaging results that were available during my care of the patient were reviewed by me and considered in my medical decision making (see chart for details).  Nasal congestion Wheezing Achilles tendinitis right leg  1.  Prednisone 40 mg daily for 5 days, patient endorses that she will go pick up the medicine on Friday 2.  Symbicort inhaler 2 puffs daily, advised continue use of albuterol inhaler as needed 3.  Recommended changing sneakers for work, daily stretching, heat to affected area as needed, foot pads for support, recommended orthopedic follow-up for persistent or reoccurring pain 4.  Recommended primary care follow-up for evaluation of COPD for persistent wheezing and congestion, advised smoking cessation Final Clinical Impressions(s) / UC Diagnoses    Final diagnoses:  None   Discharge Instructions   None    ED Prescriptions   None    PDMP not reviewed this encounter.   Hans Eden, NP 04/27/21 [phone]

## 2021-05-02 ENCOUNTER — Other Ambulatory Visit (INDEPENDENT_AMBULATORY_CARE_PROVIDER_SITE_OTHER): Payer: Self-pay | Admitting: Primary Care

## 2021-05-02 DIAGNOSIS — Z760 Encounter for issue of repeat prescription: Secondary | ICD-10-CM

## 2021-05-05 ENCOUNTER — Other Ambulatory Visit: Payer: Self-pay

## 2021-05-05 DIAGNOSIS — Z760 Encounter for issue of repeat prescription: Secondary | ICD-10-CM

## 2021-05-05 MED ORDER — PRAVASTATIN SODIUM 20 MG PO TABS: 20.0000 mg | ORAL_TABLET | Freq: Every day | ORAL | 2 refills | Status: DC

## 2021-05-05 MED ORDER — ALBUTEROL SULFATE HFA 108 (90 BASE) MCG/ACT IN AERS: INHALATION_SPRAY | RESPIRATORY_TRACT | 3 refills | Status: DC

## 2021-05-05 NOTE — Telephone Encounter (Signed)
pravastatin (PRAVACHOL) 20 MG tablet,   albuterol (VENTOLIN HFA) 108 (90 Base) MCG/ACT inhaler  amLODipine (NORVASC) 5 MG tabletrefill request @ [facility], [place].

## 2021-05-26 ENCOUNTER — Encounter: Payer: Self-pay | Admitting: Internal Medicine

## 2021-05-26 ENCOUNTER — Other Ambulatory Visit: Payer: Self-pay

## 2021-05-26 ENCOUNTER — Ambulatory Visit (INDEPENDENT_AMBULATORY_CARE_PROVIDER_SITE_OTHER): Payer: BC Managed Care – PPO | Admitting: Internal Medicine

## 2021-05-26 VITALS — BP 139/73 | HR 61 | Temp 98.5°F | Resp 28 | Ht 65.5 in | Wt 282.8 lb

## 2021-05-26 DIAGNOSIS — I10 Essential (primary) hypertension: Secondary | ICD-10-CM | POA: Diagnosis not present

## 2021-05-26 DIAGNOSIS — G44209 Tension-type headache, unspecified, not intractable: Secondary | ICD-10-CM

## 2021-05-26 DIAGNOSIS — Z0000 Encounter for general adult medical examination without abnormal findings: Secondary | ICD-10-CM

## 2021-05-26 MED ORDER — IBUPROFEN 800 MG PO TABS: 800.0000 mg | ORAL_TABLET | Freq: Three times a day (TID) | ORAL | 0 refills | Status: DC | PRN

## 2021-05-26 NOTE — Assessment & Plan Note (Signed)
Colonoscopy completed 06/2019; recommended 10 year follow up.

## 2021-05-26 NOTE — Assessment & Plan Note (Addendum)
BP 139/73 on amlodipine 5 mg qd and chlorthalidone 25 mg qd. Reports decent med compliance with no adverse effects, but states that she forgot to take medications today; emphasized the importance of daily med compliance and that elevated BP can be a source of recurrent headaches. Amlodipine was decreased from 10 to 5 due to LE edema and HCTZ 25 was dc'd. Denies headaches, SHOB, CP, and increase leg swelling. Lifestyle modifications discussed. Advised to monitor BP at home and to bring BP log during f/u visit.   Continue current regimen above without any med changes BP log and bring to f/u Lifestyle modifications

## 2021-05-26 NOTE — Patient Instructions (Addendum)
Today we discussed:  Headache: several things can cause this including tight braids, high blood pressure, dehydration, being stressed and alcohol. Please continue to stay hydrated and drink plenty of water. Think about taking breaks from braided hair to give your scalp some rest. Be mindful when drinking alcohol and avoid it if possible. You can take ibuprofen as needed for these headaches but avoid taking it for too long.   Dr Lajean Manes

## 2021-05-26 NOTE — Assessment & Plan Note (Addendum)
Acute visit today for dull, pressure-like headache that started at 3 am and resolved by mid-afternoon. She did not attend work 2/2 sxs and is requesting a work note. Describes headache as band-like and "all over" her head. Reports similar headaches 1-2x/month that resolve with the use of ibuprofen. She reports taking her BP meds late yesterday and reports missing dose today. BP 139/73 in office today. Suspect elevated BP contributed to ongoing sxs. She also reports drinking several beers last night, likely contributing to sxs. Denies being under significant stress and maintains good oral hydration. She denies associated vision changes, auras, CP, SHOB, leg swelling, or palpitations. On exam, she is in NAD and appears to be unconcerned about the topic. She is counseled on tension headaches, including using ibuprofen as needed, taking her BP meds, avoiding stress, drinking plenty of water, avoiding alcohol binges, avoiding tightly braided hairstyles, and journaling to identify triggers.   Ibuprofen 800 mg prn refilled Journaling to ID triggers Maintain hydration Work note provided

## 2021-05-26 NOTE — Progress Notes (Signed)
CC: headache   HPI:  [name] is a 60 y.o. female with a PMHx stated below and presents today for stated above. Please see the Encounters tab for problem-based Assessment & Plan for additional details.   Past Medical History:  Diagnosis Date   Anemia    Hemorrhoids    Hyperlipidemia    Hypertension    Menorrhagia     Current Outpatient Medications on File Prior to Visit  Medication Sig Dispense Refill   albuterol (VENTOLIN HFA) 108 (90 Base) MCG/ACT inhaler INHALE 1 TO 2 PUFFS BY MOUTH EVERY 6 HOURS AS NEEDED FOR WHEEZING OR SHORTNESS OF BREATH 8.5 g 3   amLODipine (NORVASC) 5 MG tablet Take 1 tablet (5 mg total) by mouth daily. 90 tablet 3   budesonide-formoterol (SYMBICORT) 80-4.5 MCG/ACT inhaler Inhale 2 puffs into the lungs daily. 1 each 12   chlorthalidone (HYGROTON) 25 MG tablet Take 1 tablet (25 mg total) by mouth daily. 90 tablet 3   diclofenac Sodium (VOLTAREN) 1 % GEL Apply 4 g topically 4 (four) times daily. 100 g 1   fluticasone (FLONASE) 50 MCG/ACT nasal spray Place 2 sprays into both nostrils daily. 16 g 0   ibuprofen (ADVIL) 600 MG tablet Take 1 tablet (600 mg total) by mouth every 6 (six) hours as needed. 30 tablet 0   nicotine polacrilex (NICOTINE MINI) 2 MG lozenge Take 1 lozenge (2 mg total) by mouth as needed for smoking cessation. 100 lozenge 0   pravastatin (PRAVACHOL) 20 MG tablet Take 1 tablet (20 mg total) by mouth daily. 90 tablet 2   predniSONE (DELTASONE) 20 MG tablet Take 2 tablets (40 mg total) by mouth daily. 10 tablet 0   No current facility-administered medications on file prior to visit.    Family History  Problem Relation Age of Onset   Cancer Mother    Pancreatic cancer Mother    Bronchitis Father    Colon polyps Neg Hx    Esophageal cancer Neg Hx    Stomach cancer Neg Hx    Rectal cancer Neg Hx    Colon cancer Neg Hx    Breast cancer Neg Hx     Social History   Socioeconomic History   Marital status: Single     Spouse name: Not on file   Number of children: Not on file   Years of education: Not on file   Highest education level: Not on file  Occupational History   Not on file  Tobacco Use   Smoking status: Every Day    Packs/day: 0.25    Types: Cigarettes   Smokeless tobacco: Never   Tobacco comments:    5 per day  Substance and Sexual Activity   Alcohol use: Yes    Alcohol/week: 3.0 standard drinks    Types: 3 Cans of beer per week    Comment: occasional , about weekly   Drug use: No   Sexual activity: Yes    Birth control/protection: Surgical  Other Topics Concern   Not on file  Social History Narrative   Not on file   Social Determinants of Health   Financial Resource Strain: Not on file  Food Insecurity: Not on file  Transportation Needs: Not on file  Physical Activity: Not on file  Stress: Not on file  Social Connections: Not on file  Intimate Partner Violence: Not on file    Review of Systems: ROS negative except for what is noted on the assessment and plan.  Vitals:   05/26/21 1533  BP: 139/73  Pulse: 61  Resp: (!) 28  Temp: 98.5 F (36.9 C)  TempSrc: Oral  SpO2: 99%  Weight: 282 lb 12.8 oz (128.3 kg)  Height: 5' 5.5" (1.664 m)     Physical Exam: Constitutional: alert, well-appearing, in NAD HENT: normocephalic, atraumatic, mucous membranes moist Eyes: conjunctiva non-erythematous, EOMI Cardiovascular: RRR, no m/r/g, non-edematous bilateral LE Pulmonary/Chest: normal work of breathing on RA, LCTAB Abdominal: soft, non-tender to palpation, non-distended MSK: normal bulk and tone  Neurological: A&O x 3, 5/5 strength in bilateral upper and lower extremities  Skin: warm and dry  Psych: normal behavior, normal affect    Assessment & Plan:   See Encounters Tab for problem based charting.  Patient seen with Dr. Merleen Nicely, MD  Internal Medicine Resident, PGY-1 [facility]

## 2021-05-27 NOTE — Progress Notes (Signed)
Internal Medicine Clinic Attending  I saw and evaluated the patient.  I personally confirmed the key portions of the history and exam documented by Dr. Patel and I reviewed pertinent patient test results.  The assessment, diagnosis, and plan were formulated together and I agree with the documentation in the resident's note.  

## 2021-07-01 ENCOUNTER — Encounter: Payer: BC Managed Care – PPO | Admitting: Student

## 2021-07-09 ENCOUNTER — Encounter: Payer: BC Managed Care – PPO | Admitting: Internal Medicine

## 2021-07-17 ENCOUNTER — Encounter: Payer: BC Managed Care – PPO | Admitting: Internal Medicine

## 2021-07-24 ENCOUNTER — Encounter: Payer: Self-pay | Admitting: Internal Medicine

## 2021-07-24 ENCOUNTER — Other Ambulatory Visit: Payer: Self-pay

## 2021-07-24 ENCOUNTER — Ambulatory Visit (INDEPENDENT_AMBULATORY_CARE_PROVIDER_SITE_OTHER): Payer: BC Managed Care – PPO | Admitting: Internal Medicine

## 2021-07-24 VITALS — BP 135/61 | HR 86 | Temp 98.3°F | Resp 24 | Ht 65.5 in | Wt 277.8 lb

## 2021-07-24 DIAGNOSIS — M7989 Other specified soft tissue disorders: Secondary | ICD-10-CM | POA: Diagnosis not present

## 2021-07-24 DIAGNOSIS — I10 Essential (primary) hypertension: Secondary | ICD-10-CM

## 2021-07-24 DIAGNOSIS — Z720 Tobacco use: Secondary | ICD-10-CM | POA: Diagnosis not present

## 2021-07-24 NOTE — Progress Notes (Signed)
° °  CC: Ankle swelling  HPI:  Allison Mcintyre is a 61 y.o. PMH noted below, who presents to the [facility] with complaints of ankle swelling. To see the management of his acute and chronic conditions, please refer to the A&P note under the encounters tab.   Past Medical History:  Diagnosis Date   Anemia    Hemorrhoids    Hyperlipidemia    Hypertension    Menorrhagia    Review of Systems:  positive for ankle pain, leg swelling, difficulty ambulating, fatigue; negative for fever, chills, erythema, or other joint pain  Physical Exam: Gen: Obese female in NAD HEENT: normocephalic atraumatic Resp: normal WOB on room air GI: soft, nontender [id number] ankle swelling, worse on the left than the right. Tenderness to palpation, DP and PT pulses intact bilaterally Skin:warm and dry Neuro:alert answering questions appropriately, able to ambulate short distances Psych: frustrated affect   Assessment & Plan:   See Encounters Tab for problem based charting.  Patient discussed with Dr. Daryll Drown

## 2021-07-24 NOTE — Patient Instructions (Signed)
Allison Mcintyre  It was a pleasure seeing you in the clinic today.   We talked about smoking as well as your ankle pain. I am going to get an ultrasound to make sure you don't have a blood clot in your leg that is causing the swelling.   Please call our clinic at [phone] if you have any questions or concerns. The best time to call is Monday-Friday from 9am-4pm, but there is someone available 24/7 at the same number. If you need medication refills, please notify your pharmacy one week in advance and they will send [place] a request.   Thank you for letting us take part in your care. We look forward to seeing you next time!

## 2021-07-24 NOTE — Assessment & Plan Note (Addendum)
Patient coming in with left ankle swelling and calf pain. She says this has been going on for two months and it has been getting worse. At a previous visit her lower extremity swelling was attributed to her amlodipine and the dose of this was decreased. On exam today both legs have edema around the ankles as well as calf tenderness however it is more pronounced on the left. Patient became very impatient to leave but the end of the visit saying that she didn't have time to wait around for further labs or anything else today because she needed to get to her transportation by 4. Discussed with patient that we will move forward with imaging for now to rule out a blood clot and hold her BP medication and if the study comes back normal we can reassess her leg pain.   Plan: - DVT studies of both legs - hold amlodipine for 2 weeks and see if swelling improves. Patient to follow up in two weeks. If swelling has improved will have to switch to another agent. If swelling has not resolved can consider other reasons for pain/edema - 2 week follow up - work note provided

## 2021-07-24 NOTE — Assessment & Plan Note (Signed)
BP 135/61. Holding amlodipine for two weeks given leg swelling to see if this the underlying cause. If leg swelling does not improve will plan to restart amlodipine at next visit. If not, will need to consider alternative agents at follow up visit in two weeks.

## 2021-07-24 NOTE — Assessment & Plan Note (Signed)
Patient has considered stopping smoking in the past but she has never quit before. She says that she is interested in quitting but doesn't want to talk about it today. The idea of chantix does sound like something she would be interested in but was more focused on her acute complaint of ankle swelling today.  - continue smoking cessation discussions.

## 2021-07-26 ENCOUNTER — Ambulatory Visit (HOSPITAL_COMMUNITY)
Admission: RE | Admit: 2021-07-26 | Discharge: 2021-07-26 | Disposition: A | Discharge status: Home / Self Care | Payer: BC Managed Care – PPO | Source: Ambulatory Visit | Attending: Student | Admitting: Student

## 2021-07-26 ENCOUNTER — Other Ambulatory Visit: Payer: Self-pay

## 2021-07-26 ENCOUNTER — Encounter (HOSPITAL_COMMUNITY): Payer: Self-pay

## 2021-07-26 VITALS — BP 142/85 | HR 63 | Resp 17

## 2021-07-26 DIAGNOSIS — R600 Localized edema: Secondary | ICD-10-CM | POA: Diagnosis not present

## 2021-07-26 MED ORDER — TIZANIDINE HCL 2 MG PO TABS: 2.0000 mg | ORAL_TABLET | Freq: Three times a day (TID) | ORAL | 0 refills | Status: DC | PRN

## 2021-07-26 NOTE — ED Triage Notes (Signed)
Pt presents with c/o L leg swelling. States she saw her PCP and was scheduled for a DVT ultrasound.   Pt states her leg and ankle are swollen.

## 2021-07-26 NOTE — Discharge Instructions (Addendum)
-  Start the muscle relaxer-Zanaflex (tizanidine), up to 3 times daily for muscle spasms and pain.  This can make you drowsy, so take at bedtime or when you do not need to drive or operate machinery. -Attend the ultrasound appointment as scheduled. Call your PCP if symptoms seem to be getting worse - swelling travelling up your leg, new shortness of breath, new chest pain. Head to the ED if the PCP cannot see you.

## 2021-07-26 NOTE — ED Provider Notes (Signed)
[place]    CSN: [id number] Arrival date & time: 07/26/21  1642      History   Chief Complaint Chief Complaint  Patient presents with   Leg Swelling    HPI Allison Mcintyre is a 61 y.o. female presenting with unilateral left lower extremity swelling for about 1 week.  Medical history cigarette smoking, denies history of DVT in the past.  Has already been evaluated by PCP for this on 07/24/2021 and ultrasound is scheduled already.  Symptoms have not gotten any worse, but she needs a work note.  States that the left ankle and calf hurt, worse with ambulation.  Has attempted ibuprofen with minimal relief.  She is a current smoker, but denies prolonged immobilization, recent trauma, hormone replacement. Denies shortness of breath, chest pain, dizziness.  States that she is actually here today for a work note, and is wishing me to date this prior to her visit today.  HPI  Past Medical History:  Diagnosis Date   Anemia    Hemorrhoids    Hyperlipidemia    Hypertension    Menorrhagia     Patient Active Problem List   Diagnosis Date Noted   Leg swelling 07/24/2021   Tension headache 05/26/2021   Tobacco use 04/09/2021   Tongue lesion 03/19/2021   Healthcare maintenance 03/19/2021   Mild peripheral edema 11/19/2016   Other fatigue 11/19/2016   Daytime somnolence 11/19/2016   Anemia 04/03/2015   Palpitations 10/04/2014   Venous (peripheral) insufficiency 10/04/2014   Mass of right thigh 12/11/2013   Hypertension 12/11/2013   Achilles tendonitis 04/21/2013    Past Surgical History:  Procedure Laterality Date   ABDOMINAL HYSTERECTOMY  2003   CESAREAN SECTION     COLONOSCOPY     POLYPECTOMY      OB History     Gravida  4   Para  1   Term  1   Preterm      AB  3   Living         SAB      IAB  3   Ectopic      Multiple      Live Births               Home Medications    Prior to Admission medications   Medication Sig Start Date  End Date Taking? Authorizing Provider  tiZANidine (ZANAFLEX) 2 MG tablet Take 1 tablet (2 mg total) by mouth every 8 (eight) hours as needed for muscle spasms. 07/26/21  Yes Hazel Sams, PA-C  albuterol (VENTOLIN HFA) 108 (90 Base) MCG/ACT inhaler INHALE 1 TO 2 PUFFS BY MOUTH EVERY 6 HOURS AS NEEDED FOR WHEEZING OR SHORTNESS OF BREATH 05/05/21   Rick Duff, MD  amLODipine (NORVASC) 5 MG tablet Take 1 tablet (5 mg total) by mouth daily. 04/09/21 04/09/22  Timothy Lasso, MD  budesonide-formoterol (SYMBICORT) 80-4.5 MCG/ACT inhaler Inhale 2 puffs into the lungs daily. 04/27/21   White, Leitha Schuller, NP  chlorthalidone (HYGROTON) 25 MG tablet Take 1 tablet (25 mg total) by mouth daily. 04/09/21 04/09/22  Timothy Lasso, MD  diclofenac Sodium (VOLTAREN) 1 % GEL Apply 4 g topically 4 (four) times daily. 03/20/21   Harvie Heck, MD  fluticasone (FLONASE) 50 MCG/ACT nasal spray Place 2 sprays into both nostrils daily. 02/23/21   Melynda Ripple, MD  ibuprofen (ADVIL) 800 MG tablet Take 1 tablet (800 mg total) by mouth every 8 (eight) hours as needed. 05/26/21  Lajean Manes, MD  nicotine polacrilex (NICOTINE MINI) 2 MG lozenge Take 1 lozenge (2 mg total) by mouth as needed for smoking cessation. 04/09/21 09/24/21  Timothy Lasso, MD  pravastatin (PRAVACHOL) 20 MG tablet Take 1 tablet (20 mg total) by mouth daily. 05/05/21   Rick Duff, MD  predniSONE (DELTASONE) 20 MG tablet Take 2 tablets (40 mg total) by mouth daily. 04/27/21   Hans Eden, NP    Family History Family History  Problem Relation Age of Onset   Cancer Mother    Pancreatic cancer Mother    Bronchitis Father    Colon polyps Neg Hx    Esophageal cancer Neg Hx    Stomach cancer Neg Hx    Rectal cancer Neg Hx    Colon cancer Neg Hx    Breast cancer Neg Hx     Social History Social History   Tobacco Use   Smoking status: Every Day    Packs/day: 0.25    Types: Cigarettes   Smokeless tobacco: Never    Tobacco comments:    5 per day  Substance Use Topics   Alcohol use: Yes    Alcohol/week: 3.0 standard drinks    Types: 3 Cans of beer per week    Comment: occasional , about weekly   Drug use: No     Allergies   Patient has no known allergies.   Review of Systems Review of Systems  Musculoskeletal:        L leg pain and swelling  All other systems reviewed and are negative.   Physical Exam Triage Vital Signs ED Triage Vitals  Enc Vitals Group     BP 07/26/21 1720 (!) 142/85     Pulse Rate 07/26/21 1720 63     Resp 07/26/21 1720 17     Temp --      Temp src --      SpO2 07/26/21 1720 93 %     Weight --      Height --      Head Circumference --      Peak Flow --      Pain Score 07/26/21 1718 0     Pain Loc --      Pain Edu? --      Excl. in [place]? --    No data found.  Updated Vital Signs BP (!) 142/85 (BP Location: Left Arm)    Pulse 63    Resp 17    SpO2 93%   Visual Acuity Right Eye Distance:   Left Eye Distance:   Bilateral Distance:    Right Eye Near:   Left Eye Near:    Bilateral Near:     Physical Exam Vitals reviewed.  Constitutional:      Appearance: Normal appearance. She is not diaphoretic.  HENT:     Head: Normocephalic and atraumatic.     Mouth/Throat:     Mouth: Mucous membranes are moist.  Eyes:     Extraocular Movements: Extraocular movements intact.     Pupils: Pupils are equal, round, and reactive to light.  Cardiovascular:     Rate and Rhythm: Normal rate and regular rhythm.     Pulses:          Radial pulses are 2+ on the right side and 2+ on the left side.     Heart sounds: Normal heart sounds.     Comments: 1+ L LE swelling extending from ankle to calf. There is calf and ankle tenderness  without point tenderness or malleolar pain. Ambulating with pain. Positive L homan sign. L calf measures 40cm; R 38cm. L ankle measures 29cm; R 27cm. DP 2+, cap refill <2 seconds.  Pulmonary:     Effort: Pulmonary effort is normal.     Breath  sounds: Normal breath sounds.  Abdominal:     Palpations: Abdomen is soft.     Tenderness: There is no abdominal tenderness. There is no guarding or rebound.  Musculoskeletal:     Right lower leg: No edema.     Left lower leg: 1+ Edema present.  Skin:    General: Skin is warm.     Capillary Refill: Capillary refill takes less than 2 seconds.  Neurological:     General: No focal deficit present.     Mental Status: She is alert and oriented to person, place, and time.  Psychiatric:        Mood and Affect: Mood normal.        Behavior: Behavior normal.        Thought Content: Thought content normal.        Judgment: Judgment normal.     UC Treatments / Results  Labs (all labs ordered are listed, but only abnormal results are displayed) Labs Reviewed - No data to display  EKG   Radiology No results found.  Procedures Procedures (including critical care time)  Medications Ordered in UC Medications - No data to display  Initial Impression / Assessment and Plan / UC Course  I have reviewed the triage vital signs and the nursing notes.  Pertinent labs & imaging results that were available during my care of the patient were reviewed by me and considered in my medical decision making (see chart for details).     This patient is a very pleasant 61 y.o. year old female presenting with unilateral L LE swelling. Afebrile, nontachy.  Has already been evaluated by PCP for this and has an ultrasound scheduled in about 1 week.  Symptoms have not gotten worse since then, but she needs a work note today.  Discussed our clinic policy, and provided her a work note for 2 days including today.  Well score for DVT is 1.  Provided her with a work note, trial of Zanaflex.  Strict return precautions discussed, if symptoms get worse at all, head to the emergency department or call PCP.  Final Clinical Impressions(s) / UC Diagnoses   Final diagnoses:  Unilateral edema of lower extremity      Discharge Instructions      -Start the muscle relaxer-Zanaflex (tizanidine), up to 3 times daily for muscle spasms and pain.  This can make you drowsy, so take at bedtime or when you do not need to drive or operate machinery. -Attend the ultrasound appointment as scheduled. Call your PCP if symptoms seem to be getting worse - swelling travelling up your leg, new shortness of breath, new chest pain. Head to the ED if the PCP cannot see you.   ED Prescriptions     Medication Sig Dispense Auth. Provider   tiZANidine (ZANAFLEX) 2 MG tablet Take 1 tablet (2 mg total) by mouth every 8 (eight) hours as needed for muscle spasms. 21 tablet Hazel Sams, PA-C      PDMP not reviewed this encounter.   Hazel Sams, PA-C 07/26/21 1736

## 2021-07-31 ENCOUNTER — Other Ambulatory Visit: Payer: Self-pay

## 2021-07-31 ENCOUNTER — Ambulatory Visit (HOSPITAL_COMMUNITY)
Admission: RE | Admit: 2021-07-31 | Discharge: 2021-07-31 | Disposition: A | Discharge status: Home / Self Care | Payer: BC Managed Care – PPO | Source: Ambulatory Visit | Attending: Internal Medicine | Admitting: Internal Medicine

## 2021-07-31 DIAGNOSIS — M7989 Other specified soft tissue disorders: Secondary | ICD-10-CM

## 2021-08-01 NOTE — Progress Notes (Signed)
Internal Medicine Clinic Attending  Case discussed with Dr. Ileene Musa  at the time of the visit.  We reviewed the residents history and exam and pertinent patient test results.  I agree with the assessment, diagnosis, and plan of care documented in the residents note.

## 2021-08-05 ENCOUNTER — Encounter (HOSPITAL_COMMUNITY): Payer: BC Managed Care – PPO

## 2021-08-07 ENCOUNTER — Encounter: Payer: BC Managed Care – PPO | Admitting: Internal Medicine

## 2021-09-04 ENCOUNTER — Telehealth: Payer: Self-pay | Admitting: *Deleted

## 2021-09-04 ENCOUNTER — Encounter: Payer: BC Managed Care – PPO | Admitting: Student

## 2021-09-04 NOTE — Telephone Encounter (Signed)
Call transferred from front office - no one answering. ?I called pt back. Pt stated the doctor stopped the Amlodipine and now she's feeling dizzy so she started taking the med again. Denied any other symptoms. ?Offered pt an appt for today - stated she can come after 2PM. Appt scheduled with Dr Eulas Post today @ 1515PM. ? ?

## 2021-09-08 ENCOUNTER — Encounter: Payer: BC Managed Care – PPO | Admitting: Student

## 2021-09-15 ENCOUNTER — Encounter: Payer: Self-pay | Admitting: Internal Medicine

## 2021-09-15 ENCOUNTER — Ambulatory Visit (INDEPENDENT_AMBULATORY_CARE_PROVIDER_SITE_OTHER): Payer: BC Managed Care – PPO | Admitting: Internal Medicine

## 2021-09-15 DIAGNOSIS — M7989 Other specified soft tissue disorders: Secondary | ICD-10-CM | POA: Diagnosis not present

## 2021-09-15 DIAGNOSIS — G44209 Tension-type headache, unspecified, not intractable: Secondary | ICD-10-CM

## 2021-09-15 DIAGNOSIS — Z720 Tobacco use: Secondary | ICD-10-CM

## 2021-09-15 DIAGNOSIS — I10 Essential (primary) hypertension: Secondary | ICD-10-CM

## 2021-09-15 DIAGNOSIS — R519 Headache, unspecified: Secondary | ICD-10-CM | POA: Diagnosis not present

## 2021-09-15 MED ORDER — VERAPAMIL HCL ER 120 MG PO TBCR: 120.0000 mg | EXTENDED_RELEASE_TABLET | Freq: Every day | ORAL | 0 refills | Status: DC

## 2021-09-15 NOTE — Assessment & Plan Note (Addendum)
Assessment: Patient does complain of mild tension-type headache at this time but states that the headache affects all areas of her head and varies in intensity. She had a more severe headache last night but did not take any sort of analgesic. She is bothered more by the pain in the evenings/mornings. She notes that these headaches are only noticeable when she does not take amlodipine and that she has not had headaches in the past. She denies photophobia or phonophobia. No nausea or vomiting is associated. No vision changes, falls, weakness. She sometimes gets tingling in her fingertips but this is not new or worsened. ?Plan: Monitor for improvement of recurrence of headaches with new BP regimen of verapamil as above. ?

## 2021-09-15 NOTE — Assessment & Plan Note (Signed)
Assessment: Patient requests nicotine patches for assistance in smoking cessation. ?Plan: I see that Nicotine lozenges were sent to her pharmacy in October 2022, which she states she did not realize and therefor has not picked up. I asked that she request these in addition to her new BP medication when she goes later today and to contact our office if she is unable to fill the prescription so that I can send nicotine patches for her. ?

## 2021-09-15 NOTE — Patient Instructions (Addendum)
Allison Mcintyre, ? ?It was a pleasure to meet you today. I am sorry that you are struggling to find a balance between headaches and leg swelling due to one of your medications, amlodipine. I would like to send in a new blood pressure medication and see if this helps both keep your headaches at [place] and not cause leg swelling, while also controlling your blood pressure. This medication is called verapamil. You will take it once daily, and I would like for you to follow-up in 2 weeks to see how you are doing with it. ? ?Remember: If you have any questions or concerns, please call our clinic at [phone] between 9am-5pm and after hours call [phone] and ask for the internal medicine resident on call. If you feel you are having a medical emergency please call 911. ? ?Farrel Gordon, DO  ?

## 2021-09-15 NOTE — Assessment & Plan Note (Addendum)
Assessment: Patient presents today with complaining of persistent bilateral LE edema with amlodipine, with further concern that not taking this medication causes her to have headaches. She has had the LE edema concern for some time now and was advised to stop taking the medication and see if there was resolution of the edema, which there was, however it appears that she was lost to follow-up in the recommended amount of time. Her blood pressure was great today, 116/65. LE edema is discussed in further detail below. ?Plan: Stop amlodipine 5 mg daily. Start verapamil 120 mg daily. Return for follow-up of blood pressure in 2 weeks. If patient is unable to tolerate verapamil, can consider restarting amlodipine at half-dose (2.5 mg) and monitoring for recurrence of edema. ?

## 2021-09-15 NOTE — Assessment & Plan Note (Addendum)
Assessment: Bilateral LE edema is 1+ to mid-calf, nontender. LLE slightly larger than RLE. The edema waxes and wanes as she takes amlodipine, which she has again started taking over the last several weeks because of the headaches she experienced when she was not taking it as she was advised to hold the medication and monitor for resolution of the edema. She has had previous work-up showing no DVT or SVT of the LE. This does not seem to be bothersome for the patient today or affect her tremendously on a day-to-day basis, rather is a confounder for why she has not been consistently taking her blood pressure medication. ?Plan: Continue to monitor with new regimen of verapamil as above. ?

## 2021-09-15 NOTE — Progress Notes (Signed)
? ?  CC: headache and leg swelling ? ?HPI: ? ?Allison Mcintyre is a 61 y.o. female with past medical history as detailed below who presents today with the chief complaint of leg swelling due to amlodipine complicated by headache when she does not take her amlodipine. Please see problem based charting for detailed assessment and plan. ? ?Past Medical History:  ?Diagnosis Date  ? Anemia   ? Hemorrhoids   ? Hyperlipidemia   ? Hypertension   ? Menorrhagia   ? ?Review of Systems:   ?Review of Systems  ?Constitutional:  Negative for weight loss.  ?HENT:  Negative for hearing loss.   ?Eyes:  Negative for blurred vision, double vision, photophobia and redness.  ?Cardiovascular:  Positive for leg swelling.  ?Musculoskeletal:  Positive for myalgias. Negative for falls.  ?Neurological:  Positive for tingling and headaches. Negative for dizziness, sensory change, focal weakness and weakness.  ? ? ?Physical Exam: ? ?Vitals:  ? 09/15/21 1348  ?BP: 116/65  ?Pulse: 85  ?Temp: 98.6 ?F (37 ?C)  ?TempSrc: Oral  ?SpO2: 99%  ?Weight: 277 lb 9.6 oz (125.9 kg)  ?Height: 5' 5.5" (1.664 m)  ? ?Constitutional:Stated age female, no acute distress. ?Cardio:Regular rate and rhythm. No murmurs, rubs, gallops. ?Pulm:Clear to auscultation bilaterally. Normal work of breathing on room air. ?KWI:[id number] 1+ pitting edema to LE; LLE slightly larger than LLE at level of calf but no tenderness to palpation noted on exam.  ?Skin:Warm and dry. ?Neuro:Alert and oriented x3. No focal deficit noted. ?Psych:Normal mood and affect. ? ?Assessment & Plan:  ? ?See Encounters Tab for problem based charting. ? ?Patient discussed with Dr. Heber  ? ?

## 2021-09-16 NOTE — Progress Notes (Signed)
Internal Medicine Clinic Attending ? ?Case discussed with Dr. Dean  At the time of the visit.  We reviewed the resident?s history and exam and pertinent patient test results.  I agree with the assessment, diagnosis, and plan of care documented in the resident?s note.  ?

## 2021-09-18 ENCOUNTER — Telehealth: Payer: Self-pay

## 2021-09-18 NOTE — Telephone Encounter (Signed)
If she does not want to take it any longer she does not have to but she will need to start taking the amlodipine again as verapamil was an alternative to that. Thanks!

## 2021-09-18 NOTE — Telephone Encounter (Signed)
Returned call to patient. States she took 1 tab of Calan SR yesterday and today. Both times she developed "stomach cramps" w/i 1 hour. She does not want to take this anymore. She has not taken with food and is willing to try that tomorrow. However, [facility] will be closed for the next 4 days and patient is wanting to know what Provider suggests. ?

## 2021-09-18 NOTE — Telephone Encounter (Signed)
Questions about the sides effects on taking verapamil (CALAN-SR) 120 MG CR tablet. Please call pt back.  ?

## 2021-09-23 NOTE — Telephone Encounter (Signed)
Returned call to patient. States she now takes the Calan SR after eating first and has had no more stomach cramps. She will continue taking this as directed. ?

## 2021-09-26 ENCOUNTER — Ambulatory Visit (INDEPENDENT_AMBULATORY_CARE_PROVIDER_SITE_OTHER): Payer: BC Managed Care – PPO | Admitting: Internal Medicine

## 2021-09-26 ENCOUNTER — Telehealth: Payer: Self-pay

## 2021-09-26 DIAGNOSIS — I10 Essential (primary) hypertension: Secondary | ICD-10-CM

## 2021-09-26 NOTE — Telephone Encounter (Signed)
Pt  is requesting a call back .. she stated that she spoke to the Rn before ( she does not know the name )  about her BP meds making her stomach hurt  who told her to make sure to take it with food so now she does  that but her back in hurting when ask the name of the medicine she does not know that either when I asked if it was her  Chlorthalidone  she stated no it starts with the letter A  ?

## 2021-09-26 NOTE — Telephone Encounter (Signed)
Return pt's call - states the new BP medication is causing her back to hurt. Pt is unsure of name. Calan was ordered on 09/15/21.Telehealth appt scheduled today 4/14 @ 2256 PM with Dr Coe;pt instructed to have her cell phone with her. ?

## 2021-09-29 ENCOUNTER — Encounter: Payer: BC Managed Care – PPO | Admitting: Internal Medicine

## 2021-09-29 ENCOUNTER — Encounter: Payer: Self-pay | Admitting: Internal Medicine

## 2021-09-29 NOTE — Assessment & Plan Note (Signed)
HPI: Patient presents for telehealth appt to discuss medication management. She states that she was started on verapamil and states that she has developed some back pain. She is concerned that the medication caused her back pain. Unfortunately, her cell phone was running out of battery so we weren't able to talk about her back pain. I counseled her that is it unlikely that her back pain is related to her knew medication. She states that she will be back in the office early this week to discuss her medication and side effects in further detail.  ? ?Plan: ?- Hold verapamil until return appt.  ?

## 2021-09-29 NOTE — Progress Notes (Signed)
? ?  I connected with  Allison Mcintyre on 09/29/21 by telephone and verified that I am speaking with the correct person using two identifiers. ?  ?I discussed the limitations of evaluation and management by telemedicine. The patient expressed understanding and agreed to proceed. ? ?CC: HTN  ? ?This is a telephone encounter between [employer] and Allison Mcintyre on 09/29/2021 for HTN. The visit was conducted with the patient located at home and Allison Mcintyre at [facility]. The patient's identity was confirmed using their DOB and current address. The patient has consented to being evaluated through a telephone encounter and understands the associated risks (an examination cannot be done and the patient may need to come in for an appointment) / benefits (allows the patient to remain at home, decreasing exposure to coronavirus). I personally spent 3 minutes on medical discussion.  ? ?HPI: ? ?[name] is a 61 y.o. with PMH as below.  ? ?Please see A&P for assessment of the patient's acute and chronic medical conditions.  ? ?Past Medical History:  ?Diagnosis Date  ? Anemia   ? Hemorrhoids   ? Hyperlipidemia   ? Hypertension   ? Menorrhagia   ? ?Review of Systems:  see a/p ? ?Assessment & Plan:  ? ?See Encounters Tab for problem based charting. ? ?Patient discussed with Dr.  Philipp Ovens ? ?

## 2021-10-03 NOTE — Progress Notes (Signed)
Internal Medicine Clinic Attending  Case discussed with Dr. Coe  At the time of the visit.  We reviewed the resident's history and exam and pertinent patient test results.  I agree with the assessment, diagnosis, and plan of care documented in the resident's note.  

## 2021-10-06 ENCOUNTER — Encounter: Payer: BC Managed Care – PPO | Admitting: Internal Medicine

## 2021-10-11 ENCOUNTER — Encounter (HOSPITAL_COMMUNITY): Payer: Self-pay

## 2021-10-11 ENCOUNTER — Ambulatory Visit (HOSPITAL_COMMUNITY)
Admission: RE | Admit: 2021-10-11 | Discharge: 2021-10-11 | Disposition: A | Discharge status: Home / Self Care | Payer: BC Managed Care – PPO | Source: Ambulatory Visit | Attending: Urgent Care | Admitting: Urgent Care

## 2021-10-11 VITALS — BP 156/76 | HR 75 | Temp 98.1°F | Resp 18

## 2021-10-11 DIAGNOSIS — M545 Low back pain, unspecified: Secondary | ICD-10-CM

## 2021-10-11 MED ORDER — METHYLPREDNISOLONE SODIUM SUCC 125 MG IJ SOLR
80.0000 mg | Freq: Once | INTRAMUSCULAR | Status: AC
  Administered 2021-10-11: 80 mg via INTRAMUSCULAR

## 2021-10-11 MED ORDER — METHYLPREDNISOLONE SODIUM SUCC 125 MG IJ SOLR
INTRAMUSCULAR | Status: AC
  Filled 2021-10-11: qty 2

## 2021-10-11 MED ORDER — DICLOFENAC SODIUM 75 MG PO TBEC: 75.0000 mg | DELAYED_RELEASE_TABLET | Freq: Two times a day (BID) | ORAL | 0 refills | Status: AC

## 2021-10-11 MED ORDER — METAXALONE 800 MG PO TABS: 800.0000 mg | ORAL_TABLET | Freq: Three times a day (TID) | ORAL | 0 refills | Status: DC

## 2021-10-11 NOTE — ED Provider Notes (Signed)
?[place] ? ? ? ?CSN: [id number] ?Arrival date & time: 10/11/21  1248 ? ? ?  ? ?History   ?Chief Complaint ?Chief Complaint  ?Patient presents with  ? APPOINTMENT: Back Pain  ? ? ?HPI ?Allison Mcintyre is a 61 y.o. female.  ? ?Pleasant 61 year old female presents today with a 1 week history of acute lower back pain.  She states she works as a Engineer, building services, and moving around at work is causing discomfort.  She does have a history of lower back and musculoskeletal issues.  She has a old prescription of Zanaflex that was prescribed in November 2022 which she has been taking without relief.  She also took over-the-counter Advil without relief.  She denies any saddle anesthesia, radicular symptoms, bowel or bladder incontinence.  She states it hurts equally on both sides.  Nothing seems to aggravate or alleviate her symptoms.  She denies any injury or trauma.  She states its been gradually getting worse over the past several days.  She missed work yesterday due to the pain.  She admits to significant weight gain recently and feels that this is the cause. ? ? ? ?Past Medical History:  ?Diagnosis Date  ? Anemia   ? Hemorrhoids   ? Hyperlipidemia   ? Hypertension   ? Menorrhagia   ? ? ?Patient Active Problem List  ? Diagnosis Date Noted  ? Leg swelling 07/24/2021  ? Tension headache 05/26/2021  ? Tobacco use 04/09/2021  ? Tongue lesion 03/19/2021  ? Healthcare maintenance 03/19/2021  ? Mild peripheral edema 11/19/2016  ? Other fatigue 11/19/2016  ? Daytime somnolence 11/19/2016  ? Anemia 04/03/2015  ? Palpitations 10/04/2014  ? Venous (peripheral) insufficiency 10/04/2014  ? Mass of right thigh 12/11/2013  ? Hypertension 12/11/2013  ? Achilles tendonitis 04/21/2013  ? ? ?Past Surgical History:  ?Procedure Laterality Date  ? ABDOMINAL HYSTERECTOMY  2003  ? CESAREAN SECTION    ? COLONOSCOPY    ? POLYPECTOMY    ? ? ?OB History   ? ? Gravida  ?4  ? Para  ?1  ? Term  ?1  ? Preterm  ?   ? AB  ?3  ? Living  ?   ?   ? ? SAB  ?   ? IAB  ?3  ? Ectopic  ?   ? Multiple  ?   ? Live Births  ?   ?   ?  ?  ? ? ? ?Home Medications   ? ?Prior to Admission medications   ?Medication Sig Start Date End Date Taking? Authorizing Provider  ?diclofenac (VOLTAREN) 75 MG EC tablet Take 1 tablet (75 mg total) by mouth 2 (two) times daily for 7 days. 10/11/21 10/18/21 Yes Marchel Foote L, PA  ?metaxalone (SKELAXIN) 800 MG tablet Take 1 tablet (800 mg total) by mouth 3 (three) times daily for 7 days. 10/11/21 10/18/21 Yes Haywood Meinders L, PA  ?albuterol (VENTOLIN HFA) 108 (90 Base) MCG/ACT inhaler INHALE 1 TO 2 PUFFS BY MOUTH EVERY 6 HOURS AS NEEDED FOR WHEEZING OR SHORTNESS OF BREATH 05/05/21   Rick Duff, MD  ?budesonide-formoterol [facility]) 80-4.5 MCG/ACT inhaler Inhale 2 puffs into the lungs daily. 04/27/21   Hans Eden, NP  ?chlorthalidone (HYGROTON) 25 MG tablet Take 1 tablet (25 mg total) by mouth daily. 04/09/21 04/09/22  Timothy Lasso, MD  ?diclofenac Sodium (VOLTAREN) 1 % GEL Apply 4 g topically 4 (four) times daily. 03/20/21   Harvie Heck, MD  ?fluticasone (FLONASE) 50  MCG/ACT nasal spray Place 2 sprays into both nostrils daily. 02/23/21   Melynda Ripple, MD  ?pravastatin (PRAVACHOL) 20 MG tablet Take 1 tablet (20 mg total) by mouth daily. 05/05/21   Rick Duff, MD  ?verapamil (CALAN-SR) 120 MG CR tablet Take 1 tablet (120 mg total) by mouth daily. 09/15/21 09/15/22  Farrel Gordon, DO  ? ? ?Family History ?Family History  ?Problem Relation Age of Onset  ? Cancer Mother   ? Pancreatic cancer Mother   ? Bronchitis Father   ? Colon polyps Neg Hx   ? Esophageal cancer Neg Hx   ? Stomach cancer Neg Hx   ? Rectal cancer Neg Hx   ? Colon cancer Neg Hx   ? Breast cancer Neg Hx   ? ? ?Social History ?Social History  ? ?Tobacco Use  ? Smoking status: Every Day  ?  Packs/day: 0.25  ?  Types: Cigarettes  ? Smokeless tobacco: Never  ? Tobacco comments:  ?  5 per day  ?Substance Use Topics  ? Alcohol use: Yes  ?  Alcohol/week: 3.0  standard drinks  ?  Types: 3 Cans of beer per week  ?  Comment: occasional , about weekly  ? Drug use: No  ? ? ? ?Allergies   ?Patient has no known allergies. ? ? ?Review of Systems ?Review of Systems  ?Musculoskeletal:  Positive for back pain.  ? ? ?Physical Exam ?Triage Vital Signs ?ED Triage Vitals  ?Enc Vitals Group  ?   BP 10/11/21 1323 (!) 156/76  ?   Pulse Rate 10/11/21 1323 75  ?   Resp 10/11/21 1323 18  ?   Temp 10/11/21 1323 98.1 ?F (36.7 ?C)  ?   Temp Source 10/11/21 1323 Oral  ?   SpO2 10/11/21 1323 100 %  ?   Weight --   ?   Height --   ?   Head Circumference --   ?   Peak Flow --   ?   Pain Score 10/11/21 1325 9  ?   Pain Loc --   ?   Pain Edu? --   ?   Excl. in [place]? --   ? ?No data found. ? ?Updated Vital Signs ?BP (!) 156/76 (BP Location: Left Arm)   Pulse 75   Temp 98.1 ?F (36.7 ?C) (Oral)   Resp 18   SpO2 100%  ? ?Visual Acuity ?Right Eye Distance:   ?Left Eye Distance:   ?Bilateral Distance:   ? ?Right Eye Near:   ?Left Eye Near:    ?Bilateral Near:    ? ?Physical Exam ?Vitals and nursing note reviewed.  ?Constitutional:   ?   General: She is not in acute distress. ?   Appearance: Normal appearance. She is obese. She is not ill-appearing, toxic-appearing or diaphoretic.  ?HENT:  ?   Head: Normocephalic and atraumatic.  ?   Nose: Nose normal.  ?   Mouth/Throat:  ?   Mouth: Mucous membranes are moist.  ?Eyes:  ?   Extraocular Movements: Extraocular movements intact.  ?   Conjunctiva/sclera: Conjunctivae normal.  ?   Pupils: Pupils are equal, round, and reactive to light.  ?Cardiovascular:  ?   Rate and Rhythm: Normal rate.  ?Pulmonary:  ?   Effort: Pulmonary effort is normal. No respiratory distress.  ?Musculoskeletal:     ?   General: Tenderness (reproducible tenderness to palpation of bilateral lower back muscles) present. No swelling, deformity or signs of injury. Normal range of  motion.  ?   Cervical back: Normal range of motion and neck supple. No rigidity or tenderness.  ?   Thoracic back:  Normal. No spasms, tenderness or bony tenderness.  ?   Lumbar back: Tenderness (reproducible muscular tenderness bilaterally) present. No swelling, edema, deformity, signs of trauma, lacerations, spasms or bony tenderness. Normal range of motion. Negative right straight leg raise test and negative left straight leg raise test. No scoliosis.  ?   Right lower leg: No edema.  ?   Left lower leg: No edema.  ?Lymphadenopathy:  ?   Cervical: No cervical adenopathy.  ?Skin: ?   General: Skin is warm.  ?   Capillary Refill: Capillary refill takes less than 2 seconds.  ?   Coloration: Skin is not jaundiced.  ?   Findings: No erythema or rash.  ?Neurological:  ?   General: No focal deficit present.  ?   Mental Status: She is alert and oriented to person, place, and time.  ?   Sensory: No sensory deficit.  ?   Motor: No weakness.  ?   Coordination: Coordination normal.  ?   Gait: Gait normal.  ?   Deep Tendon Reflexes: Reflexes normal.  ? ? ? ?UC Treatments / Results  ?Labs ?(all labs ordered are listed, but only abnormal results are displayed) ?Labs Reviewed - No data to display ? ?EKG ? ? ?Radiology ?No results found. ? ?Procedures ?Procedures (including critical care time) ? ?Medications Ordered in UC ?Medications  ?methylPREDNISolone sodium succinate (SOLU-MEDROL) 125 mg/2 mL injection 80 mg (80 mg Intramuscular Given 10/11/21 1402)  ? ? ?Initial Impression / Assessment and Plan / UC Course  ?I have reviewed the triage vital signs and the nursing notes. ? ?Pertinent labs & imaging results that were available during my care of the patient were reviewed by me and considered in my medical decision making (see chart for details). ? ?  ? ?Acute lower back pain - pt without red flag s/sx. Weight gain likely predisposing patient to lumbar strain on top of her physically active job.  Patient states her Zanaflex did not help with the discomfort, it is made her sleepy.  Patient denies any contraindications to NSAID use.  Stop any  OTCs you have been using, start taking Voltaren twice daily with food x7 days.  Patient has a follow-up with her PCP on May 8, consider getting a back brace and discussing weight loss modalities. Red flag s/sx /

## 2021-10-11 NOTE — Discharge Instructions (Addendum)
You are suffering from a lumbar strain. ?Please apply moist heat to your lower back often. ?Please avoid any extensive heavy lifting. ?Follow-up with your PCP to consider a back brace.  Moderate weight loss would also be helpful. ?Please start taking diclofenac twice daily with food to help with the pain. ?You can also take Skelaxin up to 3 times daily, monitor for side effects including sedation. ?You can also purchase OTC Salonpas 4% lidocaine patch. Apply in the morning and remove 12 hours later. ?

## 2021-10-11 NOTE — ED Triage Notes (Signed)
Plt presents with lower back pain X 1 week. ?

## 2021-10-16 ENCOUNTER — Telehealth (HOSPITAL_COMMUNITY): Payer: Self-pay | Admitting: Emergency Medicine

## 2021-10-16 NOTE — Telephone Encounter (Signed)
Pharmacy called and requested replacement prescription for Skelaxin due to patient's insurance not covering.  Reviewed with Loree Fee, APP who would like for patient to attempt to get [facility] prescription.  [facility] has available through GoodRx with coupon for $25.  Attempted to reach patient yesterday x 1 to see what her preference was, LVM.  Patient left return voicemail today, but I am unable to get her phone number to work this afternoon, will keep trying ? ?

## 2021-10-17 MED ORDER — METAXALONE 800 MG PO TABS: 800.0000 mg | ORAL_TABLET | Freq: Three times a day (TID) | ORAL | 0 refills | Status: AC

## 2021-10-17 NOTE — Telephone Encounter (Signed)
Patient wanted prescription sent to [facility] to use GoodRx coupon, resending ?

## 2021-10-20 ENCOUNTER — Ambulatory Visit: Payer: BC Managed Care – PPO | Admitting: Internal Medicine

## 2021-10-20 ENCOUNTER — Other Ambulatory Visit: Payer: Self-pay

## 2021-10-20 ENCOUNTER — Encounter: Payer: Self-pay | Admitting: Internal Medicine

## 2021-10-20 VITALS — BP 147/50 | HR 66 | Temp 98.3°F | Ht 66.0 in | Wt 288.3 lb

## 2021-10-20 DIAGNOSIS — S39012D Strain of muscle, fascia and tendon of lower back, subsequent encounter: Secondary | ICD-10-CM | POA: Diagnosis not present

## 2021-10-20 DIAGNOSIS — I10 Essential (primary) hypertension: Secondary | ICD-10-CM | POA: Diagnosis not present

## 2021-10-20 DIAGNOSIS — S39012A Strain of muscle, fascia and tendon of lower back, initial encounter: Secondary | ICD-10-CM | POA: Insufficient documentation

## 2021-10-20 DIAGNOSIS — Z720 Tobacco use: Secondary | ICD-10-CM

## 2021-10-20 DIAGNOSIS — I872 Venous insufficiency (chronic) (peripheral): Secondary | ICD-10-CM

## 2021-10-20 NOTE — Assessment & Plan Note (Addendum)
Patient's blood pressures elevated today 147/50. Patient reports non-adherence to antihypertensive regimen. She is unable to name her medications: chlorthalidone '25mg'$  daily and verapamil '120mg'$  CR daily (used to concurrently treat chronic headaches). She reports her blood pressure is elevated today because she ate a high salt meal the night before. She reports her blood pressure at home is better though does not check her pressures at home. She has a wrist cuff that she does not use. No recent BMP. ?On assessment patient's blood pressures poorly controlled secondary to non adherence to medical regimen. Discussed taking Bps at home at the same time each day and recording these, also bring BP cuff to clinic.  ?Plan: ?-Check BMP today ?-Continue verapamil '120mg'$  daily ?-Continue chlorthalidone '25mg'$  daily ?-Bring BP log and cuff to clinic ? ?ADDENDUM: BMP unremarkable. Continue as above. ?

## 2021-10-20 NOTE — Assessment & Plan Note (Addendum)
Patient presents for urgent care follow up appointment. EDP diagnosed lumbar paraspinal muscle strain and prescribed the patient Skelaxin. Patient is not sure why she is here and is requesting to leave. She reports her back pain has improved on the muscle relaxer.  ?

## 2021-10-20 NOTE — Progress Notes (Signed)
?  CC: ED follow up appointment ? ?HPI: ? ?[name] is a 61 y.o. female with a past medical history stated below and presents today for ED follow up appointment. Please see problem based assessment and plan for additional details. ? ?Past Medical History:  ?Diagnosis Date  ? Anemia   ? Hemorrhoids   ? Hyperlipidemia   ? Hypertension   ? Menorrhagia   ? ? ?Current Outpatient Medications on File Prior to Visit  ?Medication Sig Dispense Refill  ? albuterol (VENTOLIN HFA) 108 (90 Base) MCG/ACT inhaler INHALE 1 TO 2 PUFFS BY MOUTH EVERY 6 HOURS AS NEEDED FOR WHEEZING OR SHORTNESS OF BREATH 8.5 g 3  ? budesonide-formoterol (SYMBICORT) 80-4.5 MCG/ACT inhaler Inhale 2 puffs into the lungs daily. 1 each 12  ? chlorthalidone (HYGROTON) 25 MG tablet Take 1 tablet (25 mg total) by mouth daily. 90 tablet 3  ? diclofenac Sodium (VOLTAREN) 1 % GEL Apply 4 g topically 4 (four) times daily. 100 g 1  ? fluticasone (FLONASE) 50 MCG/ACT nasal spray Place 2 sprays into both nostrils daily. 16 g 0  ? metaxalone (SKELAXIN) 800 MG tablet Take 1 tablet (800 mg total) by mouth 3 (three) times daily for 7 days. 21 tablet 0  ? pravastatin (PRAVACHOL) 20 MG tablet Take 1 tablet (20 mg total) by mouth daily. 90 tablet 2  ? verapamil (CALAN-SR) 120 MG CR tablet Take 1 tablet (120 mg total) by mouth daily. 30 tablet 0  ? ?No current facility-administered medications on file prior to visit.  ? ? ?Family History  ?Problem Relation Age of Onset  ? Cancer Mother   ? Pancreatic cancer Mother   ? Bronchitis Father   ? Colon polyps Neg Hx   ? Esophageal cancer Neg Hx   ? Stomach cancer Neg Hx   ? Rectal cancer Neg Hx   ? Colon cancer Neg Hx   ? Breast cancer Neg Hx   ? ? ?Review of Systems: ?ROS negative except for what is noted on the assessment and plan. ? ?Vitals:  ? 10/20/21 1444 10/20/21 1515  ?BP: (!) 152/55 (!) 147/50  ?Pulse: 72 66  ?Temp: 98.3 ?F (36.8 ?C)   ?TempSrc: Oral   ?SpO2: 98%   ?Weight: 288 lb 4.8 oz (130.8 kg)   ?Height:  '5\' 6"'$  (1.676 m)   ? ? ? ?Physical Exam: ?General: Well appearing obese african [place] female, NAD ?HENT: normocephalic, atraumatic ?EYES: conjunctiva non-erythematous, no scleral icterus ?CV: regular rate, normal rhythm, no murmurs, rubs, gallops. Trace to 1+ pitting edema bilateral lower extremities. ?Pulmonary: normal work of breathing on RA, lungs clear to auscultation, no rales, wheezes, rhonchi ?Abdominal: non-distended, soft, non-tender to palpation, normal BS ?Skin: Warm and dry, no rashes or lesions ?Neurological: ?MS: awake, alert and oriented x3, normal speech and fund of knowledge ?Motor: moves all extremities antigravity ?Psych: normal affect ? ? ? ?Assessment & Plan:  ? ?See Encounters Tab for problem based charting. ? ?Patient discussed with Dr. Dareen Piano ? ?Wayland Denis, M.D. ?[facility] Internal Medicine, PGY-1 ?Pager: [phone] ?Date 10/20/2021 Time 7:37 PM ? ?

## 2021-10-20 NOTE — Assessment & Plan Note (Signed)
Patient has mild, trace to 1+ LEE on exam. Reports sleeps flat. Swelling is worse after long day of work, she works as a Secretary/administrator. Discussed using compression stockings for swelling. ?

## 2021-10-20 NOTE — Assessment & Plan Note (Addendum)
Patient reports her insurance does not cover nicotine patches or lozenges. Is currently still smoking 7-9 cigarettes a day. Not interested in assistance with smoking cessation today. ?Plan: ?-Continue to employ motivational interviewing at next appointment ?-[employer] [place] Website: Free NRT (patches, gum, and lozenges) for up to 12 weeks for 2 quit attempts per year ?

## 2021-10-20 NOTE — Patient Instructions (Signed)
Thank you, Ms.Carmela Rima Nicasio for allowing [place] to provide your care today. Today we discussed: ? ?High blood pressure: Please take your: ? ?Chlorthalidone '25mg'$  tablet once daily ?Verapamil '120mg'$  CR tablet once daily ? ?We would like to check your kidney function today. ? ?Please also check your blood pressures at home using your wrist cuff, Please check at the same time each day and bring your recorded numbers with you to clinic. Please also bring your BP cuff. ? ?I am glad your back pain is better. ? ?Your leg swelling is also better. Please try compression socks. You can get these at your pharmacy. They can help with pain related to swollen feet and legs. ? ?My Chart Access: ?[email or web address] ? ?Please follow-up in 1 month to recheck your blood pressures. ? ?Please make sure to arrive 15 minutes prior to your next appointment. If you arrive late, you may be asked to reschedule.  ?  ?We look forward to seeing you next time. Please call our clinic at [phone] if you have any questions or concerns. The best time to call is Monday-Friday from 9am-4pm, but there is someone available 24/7. If after hours or the weekend, call the main hospital number and ask for the Internal Medicine Resident On-Call. If you need medication refills, please notify your pharmacy one week in advance and they will send [place] a request. ?  ?Thank you for letting us take part in your care. Wishing you the best! ? ?Wayland Denis, MD ?10/20/2021, 3:34 PM ?IM Resident, PGY-1 ? ?

## 2021-10-21 ENCOUNTER — Other Ambulatory Visit: Payer: Self-pay | Admitting: Internal Medicine

## 2021-10-21 LAB — BMP8+ANION GAP
Anion Gap: 15 mmol/L (ref 10.0–18.0)
BUN/Creatinine Ratio: 34 — ABNORMAL HIGH (ref 12–28)
BUN: 20 mg/dL (ref 8–27)
CO2: 23 mmol/L (ref 20–29)
Calcium: 10.3 mg/dL (ref 8.7–10.3)
Chloride: 105 mmol/L (ref 96–106)
Creatinine, Ser: 0.59 mg/dL (ref 0.57–1.00)
Glucose: 107 mg/dL — ABNORMAL HIGH (ref 70–99)
Potassium: 3.6 mmol/L (ref 3.5–5.2)
Sodium: 143 mmol/L (ref 134–144)
eGFR: 102 mL/min/{1.73_m2} (ref >59)

## 2021-10-21 NOTE — Progress Notes (Signed)
Internal Medicine Clinic Attending ? ?Case discussed with Dr. Zinoviev  At the time of the visit.  We reviewed the resident?s history and exam and pertinent patient test results.  I agree with the assessment, diagnosis, and plan of care documented in the resident?s note.  ?

## 2021-10-21 NOTE — Addendum Note (Signed)
Addended by: Aldine Contes on: 10/21/2021 03:14 PM ? ? Modules accepted: Level of Service ? ?

## 2021-10-22 ENCOUNTER — Encounter: Payer: Self-pay | Admitting: Internal Medicine

## 2021-10-22 ENCOUNTER — Ambulatory Visit: Payer: BC Managed Care – PPO | Admitting: Internal Medicine

## 2021-10-22 VITALS — BP 139/80 | HR 69 | Ht 66.0 in | Wt 284.4 lb

## 2021-10-22 DIAGNOSIS — M7661 Achilles tendinitis, right leg: Secondary | ICD-10-CM | POA: Diagnosis not present

## 2021-10-22 DIAGNOSIS — I10 Essential (primary) hypertension: Secondary | ICD-10-CM

## 2021-10-22 NOTE — Progress Notes (Signed)
? ?  CC: right foot pain ? ?HPI: ? ?Allison Mcintyre is a 61 y.o. female with PMHx as stated below presenting for evaluation of right foot pain since morning of presentation. Please see problem based charting for complete assessment and plan. ? ?Past Medical History:  ?Diagnosis Date  ? Anemia   ? Hemorrhoids   ? Hyperlipidemia   ? Hypertension   ? Menorrhagia   ? ?Review of Systems:  Negative except as stated in HPI. ? ?Physical Exam: ? ?Vitals:  ? 10/22/21 1517 10/22/21 1538 10/22/21 1541  ?BP: (!) 171/88 (!) 180/78 139/80  ?Pulse: (!) 55 73 (!) 38  ?SpO2: 100%    ?Weight: 284 lb 6.4 oz (129 kg)    ?Height: '5\' 6"'$  (1.676 m)    ? ?Physical Exam  ?Constitutional: Middle aged obese female, no acute distress  ?Musculoskeletal: Normal bulk and tone.  No peripheral edema noted. ?R foot: No obvious bony deformity, swelling, erythema or bruising ?Tenderness to palpation of the Achilles tendon. Full ROM of ankle without significant reproducible pain with normal midfoot flexibility. NV intact distally.  ?Neurological: Is alert and oriented x4, no apparent focal deficits noted. ?Skin: Warm and dry.  No rash, erythema, lesions noted. ? ?Assessment & Plan:  ? ?See Encounters Tab for problem based charting. ? ?Patient discussed with Dr. Evette Doffing ? ?

## 2021-10-22 NOTE — Patient Instructions (Addendum)
Ms [place], ? ?It was a pleasure seeing you in clinic. Today we discussed:  ? ?Right foot pain: ?This is secondary to achilles tendonitis. At this time, continue with icing, rest and voltaren gel  ?I have provided a work note. I am also referring to sports medicine for orthotics fitting to help with more support. ?Please contact us if no improvement in symptoms ? ?If you have any questions or concerns, please call our clinic at [phone] between 9am-5pm and after hours call [phone] and ask for the internal medicine resident on call. If you feel you are having a medical emergency please call 911.  ? ?Thank you, we look forward to helping you remain healthy! ? ? ? ?

## 2021-10-24 ENCOUNTER — Telehealth: Payer: Self-pay

## 2021-10-24 NOTE — Telephone Encounter (Signed)
Patient called regarding lab results 

## 2021-10-25 NOTE — Assessment & Plan Note (Signed)
Patient is presenting for evaluation of right foot pain for one day. She reports excruciating pain when she woke up this morning. She denies any trauma to the area. On examination, she has tenderness along the achilles tendon. No ROM restrictions or other alarming features on exam to warrant x-ray. Suspect she has achilles tendonitis flare.  ? ?Plan: ?Voltaren gel qid prn ?Recommended for ice and elevation ?Referral to sports medicine for orthotics  ?Work note provided  ?

## 2021-10-25 NOTE — Assessment & Plan Note (Signed)
BP Readings from Last 3 Encounters:  ?10/22/21 139/80  ?10/20/21 (!) 147/50  ?10/11/21 (!) 156/76  ? ?Patient initially noted to be hypertensive with systolic 149-969G. She is on verapamil '120mg'$  daily and chlorthalidone '25mg'$  daily. She reports medication compliance. She denies any symptoms at this time. On repeat, BP improved to 139/80. Suspect her pain may be contributing to her elevated blood pressure. ? ?PlanL ?Continue current regimen of verapamil '120mg'$  daily and chlorthalidone '25mg'$  daily ?Patient encouraged to monitor BP at home and bring log to next visit  ?

## 2021-10-27 NOTE — Telephone Encounter (Signed)
Discussed lab results with patient. She reports mild improvement in the pain. Has appointment with Dr Felipe Drone on 5/17.

## 2021-10-27 NOTE — Progress Notes (Signed)
Internal Medicine Clinic Attending ° °Case discussed with Dr. Aslam  At the time of the visit.  We reviewed the resident’s history and exam and pertinent patient test results.  I agree with the assessment, diagnosis, and plan of care documented in the resident’s note.  °

## 2021-10-29 ENCOUNTER — Ambulatory Visit: Payer: BC Managed Care – PPO | Admitting: Family Medicine

## 2021-10-29 DIAGNOSIS — M7661 Achilles tendinitis, right leg: Secondary | ICD-10-CM | POA: Diagnosis not present

## 2021-10-29 NOTE — Progress Notes (Signed)
PCP: Rick Duff, MD ? ?Subjective:  ? ?HPI: ?Patient is a 61 y.o. female here for right achilles pain. ? ?Her right achilles has been bothering her for several months but the pain was mild. She reports she started wearing high top shoes which was helpful. 8 days ago she woke up and the pain was significantly worse. Reports she had to hold on to the wall when she was walking due to pain. ?Tried Voltaren gel which she didn't find helpful. She has not tried oral medications, ice or heat. She reports the pain is worse after sitting for long periods of time and worse with walking up stairs. Denies any inciting injury or trauma.  ? ? ?Right Low Back Pain ?Present for the past 1 month. Has not mentioned this to her primary care provider yet. ? ? ?Past Medical History:  ?Diagnosis Date  ? Anemia   ? Hemorrhoids   ? Hyperlipidemia   ? Hypertension   ? Menorrhagia   ? ? ?Current Outpatient Medications on File Prior to Visit  ?Medication Sig Dispense Refill  ? albuterol (VENTOLIN HFA) 108 (90 Base) MCG/ACT inhaler INHALE 1 TO 2 PUFFS BY MOUTH EVERY 6 HOURS AS NEEDED FOR WHEEZING OR SHORTNESS OF BREATH 8.5 g 3  ? budesonide-formoterol (SYMBICORT) 80-4.5 MCG/ACT inhaler Inhale 2 puffs into the lungs daily. 1 each 12  ? chlorthalidone (HYGROTON) 25 MG tablet Take 1 tablet (25 mg total) by mouth daily. 90 tablet 3  ? diclofenac Sodium (VOLTAREN) 1 % GEL Apply 4 g topically 4 (four) times daily. 100 g 1  ? fluticasone (FLONASE) 50 MCG/ACT nasal spray Place 2 sprays into both nostrils daily. 16 g 0  ? pravastatin (PRAVACHOL) 20 MG tablet Take 1 tablet (20 mg total) by mouth daily. 90 tablet 2  ? verapamil (CALAN-SR) 120 MG CR tablet Take 1 tablet (120 mg total) by mouth daily. 90 tablet 3  ? ?No current facility-administered medications on file prior to visit.  ? ? ?Past Surgical History:  ?Procedure Laterality Date  ? ABDOMINAL HYSTERECTOMY  2003  ? CESAREAN SECTION    ? COLONOSCOPY    ? POLYPECTOMY    ? ? ?No Known  Allergies ? ?BP 134/62   Ht '5\' 6"'$  (1.676 m)   Wt 284 lb (128.8 kg)   BMI 45.84 kg/m?  ? ? ?  04/24/2020  ?  3:10 PM  ?[place] Adult Exercise  ?Frequency of aerobic exercise (# of days/week) 0  ?Average time in minutes 0  ?Frequency of strengthening activities (# of days/week) 0  ? ?    ?Objective:  ?Physical Exam: ? ?Gen: NAD, comfortable in exam room ?MSK: ?Trace nonpitting edema of bilateral ankles.  ?Tenderness to palpation of distal insertion of right achilles tendon.  ?Full ROM of bilateral feet and ankles.  ?Normal strength with plantar and dorsiflexion. ?NV intact distally ? ?Limited MSK u/s right achilles:  Partial intrasubstance tear of achilles tendon with thickening of tendon - 1.0cm in depth at level of calcaneus.  Moderate neovascularity of tendon. ?  ?Assessment & Plan:  ?1. Right Achilles Tendinopathy- presentation consistent with achilles tendinopathy/tendinitis. Ultrasound performed today reveals thickening of achilles tendon and small areas of partial tearing within the tendon. Recommend ice, Aleve, heel lifts, and home exercises. Return in 4 weeks. Consider formal PT, orthotics, or nitro patches if not improving at that time. ? ? ?Alcus Dad, MD ?PGY-2 [facility] ? ?

## 2021-10-29 NOTE — Patient Instructions (Signed)
You have fairly severe Achilles Tendinopathy ?Aleve 2 tabs twice a day with food for pain and inflammation OR your ibuprofen '800mg'$  three times a day with food. ?Ice 15 minutes at a time 3-4 times a day. ?Avoid uneven ground, hills as much as possible. ?Heel lifts in shoes or shoes with a natural heel lift when up and walking around. ?Do home exercises as tolerated - if you have trouble doing these call us and we can put in an order for formal physical therapy. ?Consider physical therapy, orthotics, nitro patches if not improving as expected. ?Follow up in 4 weeks. ? ?

## 2021-11-26 ENCOUNTER — Ambulatory Visit: Payer: BC Managed Care – PPO | Admitting: Family Medicine

## 2021-11-28 ENCOUNTER — Ambulatory Visit: Payer: BC Managed Care – PPO | Admitting: Internal Medicine

## 2021-11-28 ENCOUNTER — Ambulatory Visit (HOSPITAL_COMMUNITY): Payer: BC Managed Care – PPO

## 2021-11-28 ENCOUNTER — Telehealth: Payer: Self-pay

## 2021-11-28 ENCOUNTER — Encounter: Payer: Self-pay | Admitting: Internal Medicine

## 2021-11-28 VITALS — BP 141/90 | HR 70 | Temp 98.1°F | Ht 66.0 in | Wt 282.6 lb

## 2021-11-28 DIAGNOSIS — R109 Unspecified abdominal pain: Secondary | ICD-10-CM

## 2021-11-28 DIAGNOSIS — K5900 Constipation, unspecified: Secondary | ICD-10-CM

## 2021-11-28 DIAGNOSIS — R1031 Right lower quadrant pain: Secondary | ICD-10-CM

## 2021-11-28 DIAGNOSIS — R1011 Right upper quadrant pain: Secondary | ICD-10-CM

## 2021-11-28 MED ORDER — BISACODYL 5 MG PO TBEC: 10.0000 mg | DELAYED_RELEASE_TABLET | Freq: Every day | ORAL | 0 refills | Status: DC | PRN

## 2021-11-28 MED ORDER — BISACODYL 5 MG PO TBEC: 10.0000 mg | DELAYED_RELEASE_TABLET | Freq: Every day | ORAL | 0 refills | Status: AC | PRN

## 2021-11-28 MED ORDER — SENNOSIDES-DOCUSATE SODIUM 8.6-50 MG PO TABS: 2.0000 | ORAL_TABLET | Freq: Every evening | ORAL | 0 refills | Status: DC | PRN

## 2021-11-28 MED ORDER — SENNOSIDES-DOCUSATE SODIUM 8.6-50 MG PO TABS: 2.0000 | ORAL_TABLET | Freq: Every evening | ORAL | 0 refills | Status: AC | PRN

## 2021-11-28 NOTE — Patient Instructions (Addendum)
[name], it was a pleasure seeing you today! You endorsed feeling well today. Below are some of the things we talked about this visit. We look forward to seeing you in the follow up appointment!  Today we discussed: You presented today for abdominal pain, fatigue, and lack of appetite.  We will check lab work to ensure your organs are working fine.  You have not had a bowel movement for more than 3 days so I will locations to ensure you have a bowel movement.  Constipation can cause all the symptoms you are having.  We are still would like to rule out any other causes for your symptoms so we are ordering the labs.  I will give you a call to give you the results for these labs.  Please ensure you are adequately hydrated by drinking at least 80 ounces of water a day.  I have ordered the following labs today:  Lab Orders         CMP14 + Anion Gap         Lipase         CBC with Diff       Referrals ordered today:   Referral Orders  No referral(s) requested today     I have ordered the following medication/changed the following medications:   Stop the following medications: Medications Discontinued During This Encounter  Medication Reason   senna-docusate (SENOKOT S) 8.6-50 MG tablet    bisacodyl (DULCOLAX) 5 MG EC tablet      Start the following medications: Meds ordered this encounter  Medications   DISCONTD: senna-docusate (SENOKOT S) 8.6-50 MG tablet    Sig: Take 2 tablets by mouth at bedtime as needed for mild constipation.    Dispense:  60 tablet    Refill:  0   DISCONTD: bisacodyl (DULCOLAX) 5 MG EC tablet    Sig: Take 2 tablets (10 mg total) by mouth daily as needed for moderate constipation.    Dispense:  30 tablet    Refill:  0   bisacodyl (DULCOLAX) 5 MG EC tablet    Sig: Take 2 tablets (10 mg total) by mouth daily as needed for moderate constipation.    Dispense:  30 tablet    Refill:  0   senna-docusate (SENOKOT S) 8.6-50 MG tablet    Sig: Take 2  tablets by mouth at bedtime as needed for mild constipation.    Dispense:  60 tablet    Refill:  0     Follow-up: 1 month follow up or as needed   Please make sure to arrive 15 minutes prior to your next appointment. If you arrive late, you may be asked to reschedule.   We look forward to seeing you next time. Please call our clinic at [phone] if you have any questions or concerns. The best time to call is Monday-Friday from 9am-4pm, but there is someone available 24/7. If after hours or the weekend, call the main hospital number and ask for the Internal Medicine Resident On-Call. If you need medication refills, please notify your pharmacy one week in advance and they will send [place] a request.  Thank you for letting us take part in your care. Wishing you the best!  Thank you, Idamae Schuller, MD

## 2021-11-28 NOTE — Telephone Encounter (Signed)
Return call to pt - no answer; left message the office called and to call us back .

## 2021-11-28 NOTE — Telephone Encounter (Signed)
Pt is requesting a call back .Marland Kitchen Pt stated that  she was having a "weird feeling " yesterday and today she has not eaten anything .Marland Kitchen I offered her the first available  with her PCP  which is 6/20 '@915'$   pt refused stated she  wants today I explained we are closed at 12 and  booked up .Marland Kitchen She stated complaining she does not want to go to the ED either I offered to  get a RN for her she stated for what what are they going do I stated when they call they will speak to you

## 2021-11-28 NOTE — Progress Notes (Unsigned)
   CC: abdominal pain/fatigue  HPI:  Allison Mcintyre is a 61 y.o. with medical history as below presenting to [facility] for abdominal pain/fatigue.   Please see problem-based list for further details, assessments, and plans.  Past Medical History:  Diagnosis Date   Anemia    Hemorrhoids    Hyperlipidemia    Hypertension    Menorrhagia      Current Outpatient Medications (Cardiovascular):    chlorthalidone (HYGROTON) 25 MG tablet, Take 1 tablet (25 mg total) by mouth daily.   pravastatin (PRAVACHOL) 20 MG tablet, Take 1 tablet (20 mg total) by mouth daily.   verapamil (CALAN-SR) 120 MG CR tablet, Take 1 tablet (120 mg total) by mouth daily.  Current Outpatient Medications (Respiratory):    albuterol (VENTOLIN HFA) 108 (90 Base) MCG/ACT inhaler, INHALE 1 TO 2 PUFFS BY MOUTH EVERY 6 HOURS AS NEEDED FOR WHEEZING OR SHORTNESS OF BREATH   budesonide-formoterol (SYMBICORT) 80-4.5 MCG/ACT inhaler, Inhale 2 puffs into the lungs daily.   fluticasone (FLONASE) 50 MCG/ACT nasal spray, Place 2 sprays into both nostrils daily.    Current Outpatient Medications (Other):    diclofenac Sodium (VOLTAREN) 1 % GEL, Apply 4 g topically 4 (four) times daily.  Review of Systems:  Review of system negative unless stated in the problem list or HPI.    Physical Exam:  Vitals:   11/28/21 1024  BP: (!) 141/90  Pulse: 70  Temp: 98.1 F (36.7 C)  TempSrc: Oral  SpO2: 98%  Weight: 282 lb 9.6 oz (128.2 kg)  Height: '5\' 6"'$  (1.676 m)    Physical Exam General: NAD HENT: NCAT Lungs: CTAB, no wheeze, rhonchi or rales.  Cardiovascular: Normal heart sounds, no r/m/g, 2+ pulses in all extremities. No LE edema Abdomen: TTP in right upper and lower quadrant and right flank pain, normal bowel sounds MSK: No asymmetry or muscle atrophy.  Skin: no lesions noted on exposed skin Neuro: Alert and oriented x4. CN grossly intact Psych: Normal mood and normal affect   Assessment & Plan:   See Encounters  Tab for problem based charting.  Patient discussed with Dr. Roderic Ovens, MD

## 2021-11-28 NOTE — Telephone Encounter (Signed)
Return call from pt - stated she had right-sided abd pain which started on Wed.Now, feeling weak,no appetite. Open appt @ 1015 AM w/Dr Obryan Rolls this morning - stated she will be here.

## 2021-11-29 DIAGNOSIS — R109 Unspecified abdominal pain: Secondary | ICD-10-CM | POA: Insufficient documentation

## 2021-11-29 LAB — CBC WITH DIFFERENTIAL/PLATELET
Basophils Absolute: 0 10*3/uL (ref 0.0–0.2)
Basos: 0 %
EOS (ABSOLUTE): 0.2 10*3/uL (ref 0.0–0.4)
Eos: 5 %
Hematocrit: 40.3 % (ref 34.0–46.6)
Hemoglobin: 13.6 g/dL (ref 11.1–15.9)
Immature Grans (Abs): 0 10*3/uL (ref 0.0–0.1)
Immature Granulocytes: 0 %
Lymphocytes Absolute: 1.3 10*3/uL (ref 0.7–3.1)
Lymphs: 25 %
MCH: 32.2 pg (ref 26.6–33.0)
MCHC: 33.7 g/dL (ref 31.5–35.7)
MCV: 96 fL (ref 79–97)
Monocytes Absolute: 0.5 10*3/uL (ref 0.1–0.9)
Monocytes: 10 %
Neutrophils Absolute: 3.2 10*3/uL (ref 1.4–7.0)
Neutrophils: 60 %
Platelets: 347 10*3/uL (ref 150–450)
RBC: 4.22 x10E6/uL (ref 3.77–5.28)
RDW: 12.2 % (ref 11.7–15.4)
WBC: 5.3 10*3/uL (ref 3.4–10.8)

## 2021-11-29 LAB — LIPASE: Lipase: 26 U/L (ref 14–72)

## 2021-11-29 LAB — CMP14 + ANION GAP
ALT: 353 IU/L — ABNORMAL HIGH (ref 0–32)
AST: 220 IU/L — ABNORMAL HIGH (ref 0–40)
Albumin/Globulin Ratio: 1.4 (ref 1.2–2.2)
Albumin: 4.5 g/dL (ref 3.8–4.8)
Alkaline Phosphatase: 258 IU/L — ABNORMAL HIGH (ref 44–121)
Anion Gap: 16 mmol/L (ref 10.0–18.0)
BUN/Creatinine Ratio: 19 (ref 12–28)
BUN: 15 mg/dL (ref 8–27)
Bilirubin Total: 0.7 mg/dL (ref 0.0–1.2)
CO2: 23 mmol/L (ref 20–29)
Calcium: 9.8 mg/dL (ref 8.7–10.3)
Chloride: 100 mmol/L (ref 96–106)
Creatinine, Ser: 0.8 mg/dL (ref 0.57–1.00)
Globulin, Total: 3.2 g/dL (ref 1.5–4.5)
Glucose: 112 mg/dL — ABNORMAL HIGH (ref 70–99)
Potassium: 3.9 mmol/L (ref 3.5–5.2)
Sodium: 139 mmol/L (ref 134–144)
Total Protein: 7.7 g/dL (ref 6.0–8.5)
eGFR: 83 mL/min/{1.73_m2} (ref >59)

## 2021-11-29 NOTE — Assessment & Plan Note (Addendum)
Patient presented with acute abdominal pain that resolved without intervention and she has been having decreased appetite and increased fatigue since then.  She also endorsed no bowel movement prior to the pain onset.  Differential diagnosis included pancreatitis versus gastroduodenal ulcer versus cholelithiasis versus nephrolithiasis.  Patient did not want to give thorough history and was initially refusing physical exam stating the pain had gone and she just needed a work note before going home.  On physical exam showed TTP on the right upper and lower quadrant and right flank.  She denied symptoms of fevers, chills, and symptoms of nausea or vomiting.  Plan is to obtain lab work to exclude acute causes of abdominal pain.  I will give her a bowel regimen as her constipation can explain her presenting symptoms.  Orthostatics were obtained due to low p.o. intake but was negative. - CBC, CMP, lipase.  Unable to get a UA as patient had already left before that was ordered.  If studies are unrevealing we will have patient come back for simple collection. - Bowel regimen with Senokot -S, and Dulcolax.  Addendum: Patient CBC was unremarkable, lipase is normal, CMP shows elevation and AST 220, ALT 253, and alkaline phosphatase 258.  Her transaminase elevation concerning for biliary cause of her pain although the last liver enzymes were obtained 2 years ago.  I will order right upper quadrant ultrasound to assess for gallbladder and liver.  I called the patient to inform her of her results and she stated she had no recurrence of her pain and she had a bowel movement with the medications and her fatigue and appetite had improved.  I will follow-up on the results of the right upper quadrant ultrasound.

## 2021-12-04 ENCOUNTER — Ambulatory Visit (HOSPITAL_COMMUNITY)
Admission: RE | Admit: 2021-12-04 | Discharge: 2021-12-04 | Disposition: A | Discharge status: Home / Self Care | Payer: BC Managed Care – PPO | Source: Ambulatory Visit | Attending: Internal Medicine | Admitting: Internal Medicine

## 2021-12-04 DIAGNOSIS — R1011 Right upper quadrant pain: Secondary | ICD-10-CM | POA: Diagnosis present

## 2021-12-13 ENCOUNTER — Ambulatory Visit (HOSPITAL_COMMUNITY)
Admission: RE | Admit: 2021-12-13 | Discharge: 2021-12-13 | Disposition: A | Discharge status: Home / Self Care | Payer: BC Managed Care – PPO | Source: Ambulatory Visit | Attending: Emergency Medicine | Admitting: Emergency Medicine

## 2021-12-13 ENCOUNTER — Encounter (HOSPITAL_COMMUNITY): Payer: Self-pay

## 2021-12-13 VITALS — BP 155/82 | HR 69 | Temp 99.4°F | Resp 18 | Ht 66.0 in | Wt 280.0 lb

## 2021-12-13 DIAGNOSIS — K8020 Calculus of gallbladder without cholecystitis without obstruction: Secondary | ICD-10-CM | POA: Diagnosis not present

## 2021-12-13 MED ORDER — KETOROLAC TROMETHAMINE 30 MG/ML IJ SOLN
INTRAMUSCULAR | Status: AC
  Filled 2021-12-13: qty 1

## 2021-12-13 MED ORDER — IBUPROFEN 800 MG PO TABS: 800.0000 mg | ORAL_TABLET | Freq: Once | ORAL | Status: DC

## 2021-12-13 MED ORDER — KETOROLAC TROMETHAMINE 60 MG/2ML IM SOLN
30.0000 mg | Freq: Once | INTRAMUSCULAR | Status: AC
  Administered 2021-12-13: 30 mg via INTRAMUSCULAR

## 2021-12-13 NOTE — ED Triage Notes (Signed)
Patient c/o possible gallstones in gallbladder for several weeks.  Patient is having abdominal pain, diarrhea, no nausea.  Patient has taken Ibuprofen '800mg'$  w/o relief.

## 2021-12-13 NOTE — Discharge Instructions (Signed)
Please follow up with the general surgeons. Call to make an appointment as soon as you can.  If symptoms are persistent or severe, you should go to the emergency room.  Please note you are signing out against medical advice from the clinic today.

## 2021-12-13 NOTE — ED Provider Notes (Signed)
[place]    CSN: [id number] Arrival date & time: 12/13/21  1108     History   Chief Complaint Chief Complaint  Patient presents with   Abdominal Pain    Entered by patient   Appointment    HPI Allison Mcintyre is a 61 y.o. female.  Presents with abdominal pain for several weeks, one episode of diarrhea this morning.  She was seen by internal medicine on 6/16 with possible concern for gallstones.  Elevated liver enzymes at that time, received ultrasound on 6/22 that revealed cholelithiasis without evidence of acute cholecystitis.  Took one dose of ibuprofen 3 days ago and reports this did not improve her pain.  She has not tried any other medicines since then.  Reports pain is worse when she eats fried and greasy foods.  She has been eating burgers. Denies any nausea or vomiting.  No fevers.  S/p hysterectomy.   Past Medical History:  Diagnosis Date   Anemia    Hemorrhoids    Hyperlipidemia    Hypertension    Menorrhagia     Patient Active Problem List   Diagnosis Date Noted   Abdominal pain 11/29/2021   Strain of lumbar paraspinal muscle 10/20/2021   Leg swelling 07/24/2021   Tension headache 05/26/2021   Tobacco use 04/09/2021   Tongue lesion 03/19/2021   Healthcare maintenance 03/19/2021   Mild peripheral edema 11/19/2016   Other fatigue 11/19/2016   Daytime somnolence 11/19/2016   Anemia 04/03/2015   Palpitations 10/04/2014   Venous (peripheral) insufficiency 10/04/2014   Mass of right thigh 12/11/2013   Hypertension 12/11/2013   Achilles tendonitis 04/21/2013    Past Surgical History:  Procedure Laterality Date   ABDOMINAL HYSTERECTOMY  2003   CESAREAN SECTION     COLONOSCOPY     POLYPECTOMY      OB History     Gravida  4   Para  1   Term  1   Preterm      AB  3   Living         SAB      IAB  3   Ectopic      Multiple      Live Births               Home Medications    Prior to Admission medications    Medication Sig Start Date End Date Taking? Authorizing Provider  albuterol (VENTOLIN HFA) 108 (90 Base) MCG/ACT inhaler INHALE 1 TO 2 PUFFS BY MOUTH EVERY 6 HOURS AS NEEDED FOR WHEEZING OR SHORTNESS OF BREATH 05/05/21  Yes Rick Duff, MD  bisacodyl (DULCOLAX) 5 MG EC tablet Take 2 tablets (10 mg total) by mouth daily as needed for moderate constipation. 11/28/21 12/28/21 Yes Idamae Schuller, MD  budesonide-formoterol [facility]) 80-4.5 MCG/ACT inhaler Inhale 2 puffs into the lungs daily. 04/27/21  Yes White, Adrienne R, NP  chlorthalidone (HYGROTON) 25 MG tablet Take 1 tablet (25 mg total) by mouth daily. 04/09/21 04/09/22 Yes Timothy Lasso, MD  diclofenac Sodium (VOLTAREN) 1 % GEL Apply 4 g topically 4 (four) times daily. 03/20/21  Yes Aslam, Loralyn Freshwater, MD  fluticasone (FLONASE) 50 MCG/ACT nasal spray Place 2 sprays into both nostrils daily. 02/23/21  Yes Melynda Ripple, MD  pravastatin (PRAVACHOL) 20 MG tablet Take 1 tablet (20 mg total) by mouth daily. 05/05/21  Yes Rick Duff, MD  senna-docusate (SENOKOT S) 8.6-50 MG tablet Take 2 tablets by mouth at bedtime as needed for mild constipation.  11/28/21 12/28/21 Yes Idamae Schuller, MD  verapamil (CALAN-SR) 120 MG CR tablet Take 1 tablet (120 mg total) by mouth daily. 10/27/21 10/22/22 Yes Iona Beard, MD    Family History Family History  Problem Relation Age of Onset   Cancer Mother    Pancreatic cancer Mother    Bronchitis Father    Colon polyps Neg Hx    Esophageal cancer Neg Hx    Stomach cancer Neg Hx    Rectal cancer Neg Hx    Colon cancer Neg Hx    Breast cancer Neg Hx     Social History Social History   Tobacco Use   Smoking status: Every Day    Packs/day: 0.25    Types: Cigarettes   Smokeless tobacco: Never   Tobacco comments:    5 per day  Substance Use Topics   Alcohol use: Yes    Alcohol/week: 3.0 standard drinks of alcohol    Types: 3 Cans of beer per week    Comment: occasional , about weekly   Drug use:  No     Allergies   Patient has no known allergies.   Review of Systems Review of Systems  Gastrointestinal:  Positive for abdominal pain.   Per HPI  Physical Exam Triage Vital Signs ED Triage Vitals  Enc Vitals Group     BP 12/13/21 1205 (!) 155/82     Pulse Rate 12/13/21 1205 69     Resp 12/13/21 1205 18     Temp 12/13/21 1205 99.4 F (37.4 C)     Temp Source 12/13/21 1205 Oral     SpO2 12/13/21 1205 94 %     Weight 12/13/21 1206 280 lb (127 kg)     Height 12/13/21 1206 '5\' 6"'$  (1.676 m)     Head Circumference --      Peak Flow --      Pain Score 12/13/21 1206 9     Pain Loc --      Pain Edu? --      Excl. in [place]? --    No data found.  Updated Vital Signs BP (!) 155/82 (BP Location: Left Arm)   Pulse 69   Temp 99.4 F (37.4 C) (Oral)   Resp 18   Ht '5\' 6"'$  (1.676 m)   Wt 280 lb (127 kg)   SpO2 94%   BMI 45.19 kg/m    Physical Exam Vitals and nursing note reviewed.  Constitutional:      General: She is not in acute distress.    Appearance: Normal appearance.  HENT:     Mouth/Throat:     Pharynx: Oropharynx is clear.  Eyes:     Conjunctiva/sclera: Conjunctivae normal.  Cardiovascular:     Rate and Rhythm: Normal rate and regular rhythm.     Heart sounds: Normal heart sounds.  Pulmonary:     Effort: Pulmonary effort is normal. No respiratory distress.     Breath sounds: Normal breath sounds.  Abdominal:     General: Bowel sounds are normal.     Palpations: Abdomen is soft.     Tenderness: There is abdominal tenderness in the right upper quadrant and right lower quadrant. There is no right CVA tenderness, left CVA tenderness, guarding or rebound.  Neurological:     Mental Status: She is alert.     UC Treatments / Results  Labs (all labs ordered are listed, but only abnormal results are displayed) Labs Reviewed - No data to display  EKG  Radiology No results found.  Procedures Procedures   Medications Ordered in UC Medications  ketorolac  (TORADOL) injection 30 mg (30 mg Intramuscular Given 12/13/21 1230)    Initial Impression / Assessment and Plan / UC Course  I have reviewed the triage vital signs and the nursing notes.  Pertinent labs & imaging results that were available during my care of the patient were reviewed by me and considered in my medical decision making (see chart for details).  States her pain is 9/10.  I offered ibuprofen but she said this does not work for her.  I then offered Toradol shot which she accepted, however, she does not want to "wait around" and see if this medicine helps her pain.  Since her pain is 9/10 and she has gallstones I do recommend that she stay to see if her pain improves.  Otherwise she may need to go to the emergency department due to severe abdominal pain.  Patient reports she will be leaving immediately after receiving the pain medicine and does not want to "stick around and wait".  She is therefore signing out [place].  Provided [facility] [place] surgery contact information for outpatient follow up. Emergency department precautions.   Patient repeatedly requesting work note.  Final Clinical Impressions(s) / UC Diagnoses   Final diagnoses:  Calculus of gallbladder without cholecystitis without obstruction     Discharge Instructions      Please follow up with the general surgeons. Call to make an appointment as soon as you can.  If symptoms are persistent or severe, you should go to the emergency room.  Please note you are signing out against medical advice from the clinic today.     ED Prescriptions   None    PDMP not reviewed this encounter.   Janelys Glassner, Vernice Jefferson 12/13/21 1246

## 2021-12-17 ENCOUNTER — Ambulatory Visit (INDEPENDENT_AMBULATORY_CARE_PROVIDER_SITE_OTHER): Payer: BC Managed Care – PPO | Admitting: Family Medicine

## 2021-12-17 ENCOUNTER — Encounter: Payer: Self-pay | Admitting: Family Medicine

## 2021-12-17 VITALS — BP 133/64 | Ht 66.0 in | Wt 284.0 lb

## 2021-12-17 DIAGNOSIS — M7661 Achilles tendinitis, right leg: Secondary | ICD-10-CM

## 2021-12-17 MED ORDER — NITROGLYCERIN 0.2 MG/HR TD PT24: MEDICATED_PATCH | TRANSDERMAL | 1 refills | Status: DC

## 2021-12-17 NOTE — Patient Instructions (Signed)
Start physical therapy and do home exercises on days you don't go to therapy. Nitro patches 1/4th patch to affected achilles, change daily.  Heel lifts in shoes. Avoid flat shoes, barefoot walking as much as possible. Follow up with me in 6 weeks.

## 2021-12-17 NOTE — Progress Notes (Signed)
PCP: Rick Duff, MD  Subjective:   HPI: Patient is a 61 y.o. female here for right achilles pain.  5/17: Her right achilles has been bothering her for several months but the pain was mild. She reports she started wearing high top shoes which was helpful. 8 days ago she woke up and the pain was significantly worse. Reports she had to hold on to the wall when she was walking due to pain. Tried Voltaren gel which she didn't find helpful. She has not tried oral medications, ice or heat. She reports the pain is worse after sitting for long periods of time and worse with walking up stairs. Denies any inciting injury or trauma.   7/5: Patient reports she feels about the same compared to last visit. No new injury. She has been using voltaren gel which helps. Not regularly doing home exercises. Needs more heel lifts. Some pain now in left achilles.  Past Medical History:  Diagnosis Date   Anemia    Hemorrhoids    Hyperlipidemia    Hypertension    Menorrhagia     Current Outpatient Medications on File Prior to Visit  Medication Sig Dispense Refill   albuterol (VENTOLIN HFA) 108 (90 Base) MCG/ACT inhaler INHALE 1 TO 2 PUFFS BY MOUTH EVERY 6 HOURS AS NEEDED FOR WHEEZING OR SHORTNESS OF BREATH 8.5 g 3   bisacodyl (DULCOLAX) 5 MG EC tablet Take 2 tablets (10 mg total) by mouth daily as needed for moderate constipation. 30 tablet 0   budesonide-formoterol (SYMBICORT) 80-4.5 MCG/ACT inhaler Inhale 2 puffs into the lungs daily. 1 each 12   chlorthalidone (HYGROTON) 25 MG tablet Take 1 tablet (25 mg total) by mouth daily. 90 tablet 3   diclofenac Sodium (VOLTAREN) 1 % GEL Apply 4 g topically 4 (four) times daily. 100 g 1   fluticasone (FLONASE) 50 MCG/ACT nasal spray Place 2 sprays into both nostrils daily. 16 g 0   pravastatin (PRAVACHOL) 20 MG tablet Take 1 tablet (20 mg total) by mouth daily. 90 tablet 2   senna-docusate (SENOKOT S) 8.6-50 MG tablet Take 2 tablets by mouth at bedtime as  needed for mild constipation. 60 tablet 0   verapamil (CALAN-SR) 120 MG CR tablet Take 1 tablet (120 mg total) by mouth daily. 90 tablet 3   No current facility-administered medications on file prior to visit.    Past Surgical History:  Procedure Laterality Date   ABDOMINAL HYSTERECTOMY  2003   CESAREAN SECTION     COLONOSCOPY     POLYPECTOMY      No Known Allergies  BP 133/64   Ht '5\' 6"'$  (1.676 m)   Wt 284 lb (128.8 kg)   BMI 45.84 kg/m      04/24/2020    3:10 PM  [place] Adult Exercise  Frequency of aerobic exercise (# of days/week) 0  Average time in minutes 0  Frequency of strengthening activities (# of days/week) 0        No data to display              Objective:  Physical Exam:  Gen: NAD, comfortable in exam room  Right foot/ankle: Pes planus.  No other gross deformity, swelling, ecchymoses FROM TTP insertion of achilles on calcaneus. Negative ant drawer and negative talar tilt.   Negative syndesmotic compression. Thompsons test negative. NV intact distally.   Assessment & Plan:  1. Right achilles tendinopathy - start physical therapy.  Nitro patches - discussed risks of headache, skin  irritation.  Heel lifts.  F/u in 6 weeks.

## 2021-12-18 ENCOUNTER — Telehealth: Payer: Self-pay | Admitting: Family Medicine

## 2021-12-18 NOTE — Telephone Encounter (Signed)
Pt called states doesn't understand where to place Nitro patch.. she has cut them into sections of 4 but needs someone to contact her to advise where they go heel? . --forwarding message to med asst for review. -glh

## 2021-12-26 ENCOUNTER — Other Ambulatory Visit: Payer: Self-pay | Admitting: Student

## 2021-12-26 DIAGNOSIS — Z760 Encounter for issue of repeat prescription: Secondary | ICD-10-CM

## 2021-12-30 ENCOUNTER — Ambulatory Visit: Payer: BC Managed Care – PPO

## 2022-01-01 ENCOUNTER — Ambulatory Visit: Payer: Self-pay | Admitting: Surgery

## 2022-01-01 NOTE — H&P (View-Only) (Signed)
History of Present Illness: Allison Mcintyre is a 61 y.o. female who is seen today as an office consultation for evaluation of New Patient (Gallstones )   Several weeks ago she had an acute episode of upper abdominal pain in the epigastric area and RUQ. It was very severe and prompted her to go to the ED on 12/13/21. The pain lasted for about 5 hours. Labs in the ED were significant for elevated transaminases and alk phos. Bilirubin and lipase were normal. She had a RUQ [place] that showed cholelithiasis without signs of cholecystitis. She was referred for outpatient follow up. She has had several episodes of pain since then, most recently last night. She denies any jaundice or darkening of her urine.   She has not had any prior abdominal surgeries.    Review of Systems: A complete review of systems was obtained from the patient.  I have reviewed this information and discussed as appropriate with the patient.  See HPI as well for other ROS.   Medical History: Past Medical History  History reviewed. No pertinent past medical history.        Patient Active Problem List  Diagnosis   Calculus of gallbladder without cholecystitis without obstruction      Past Surgical History  History reviewed. No pertinent surgical history.      Allergies  No Known Allergies           Current Outpatient Medications on File Prior to Visit  Medication Sig Dispense Refill   amLODIPine (NORVASC) 5 MG tablet Take 5 mg by mouth once daily       bisacodyL (DULCOLAX) 5 mg EC tablet TAKE 2 TABLETS (10 MG TOTAL) BY MOUTH DAILY AS NEEDED FOR MODERATE CONSTIPATION.       budesonide-formoteroL (SYMBICORT) 80-4.5 mcg/actuation inhaler Inhale into the lungs       diclofenac (VOLTAREN) 1 % topical gel Apply 4 g topically 4 (four) times daily       hydroCHLOROthiazide (HYDRODIURIL) 25 MG tablet Take 25 mg by mouth every morning       nitroGLYcerin (NITRODUR) 0.2 mg/hr patch Apply 1/4th patch to affected achilles, change  daily       chlorthalidone 25 MG tablet Take 25 mg by mouth once daily       pravastatin (PRAVACHOL) 20 MG tablet Take 20 mg by mouth once daily        No current facility-administered medications on file prior to visit.      Family History       Family History  Problem Relation Age of Onset   High blood pressure (Hypertension) Sister     Hyperlipidemia (Elevated cholesterol) Sister          Social History        Tobacco Use  Smoking Status Every Day   Types: Cigarettes  Smokeless Tobacco Current      Social History  Social History         Socioeconomic History   Marital status: Single  Tobacco Use   Smoking status: Every Day      Types: Cigarettes   Smokeless tobacco: Current  Substance and Sexual Activity   Alcohol use: Never   Drug use: Never        Objective:       Vitals:    12/31/21 1537  BP: 132/78  Pulse: 62  Temp: 36.6 C (97.8 F)  SpO2: 97%  Weight: (!) 122.4 kg (269 lb 12.8 oz)  Height: 167.6 cm (5'  6")    Body mass index is 43.55 kg/m.   Physical Exam Vitals reviewed.  Constitutional:      Appearance: Normal appearance.  Eyes:     General: No scleral icterus.    Conjunctiva/sclera: Conjunctivae normal.  Cardiovascular:     Rate and Rhythm: Normal rate and regular rhythm.  Pulmonary:     Effort: Pulmonary effort is normal. No respiratory distress.     Breath sounds: Normal breath sounds. No wheezing.  Abdominal:     General: There is no distension.     Palpations: Abdomen is soft.     Tenderness: There is no abdominal tenderness.  Musculoskeletal:        General: Normal range of motion.  Skin:    General: Skin is warm and dry.     Coloration: Skin is not jaundiced.  Neurological:     General: No focal deficit present.     Mental Status: She is alert and oriented to person, place, and time.  Psychiatric:        Mood and Affect: Mood normal.        Behavior: Behavior normal.        Thought Content: Thought content normal.           Assessment and Plan:     Diagnoses and all orders for this visit:   Calculus of gallbladder without cholecystitis without obstruction   Elevated LFTs -     Hepatic Function Panel (HFP)       This is a 61 yo female presenting with intermittent RUQ abdominal pain and elevated LFTs. Her symptoms are consistent with biliary colic. RUQ US showed gallstones and hepatic steatosis, both of which could contribute to elevated transaminases. Laparoscopic cholecystectomy with intraoperative cholangiogram was recommended. The details of this procedure were discussed with the patient, and she expressed understanding and agrees to proceed with surgery. Will also send patient for LFTs today to ensure they have normalized. Patient will be contacted to schedule an elective surgery date.  [name], [facility], Hepatobiliary and Pancreatic Surgery 01/01/22 9:48 AM

## 2022-01-01 NOTE — H&P (Signed)
History of Present Illness: Allison Mcintyre is a 61 y.o. female who is seen today as an office consultation for evaluation of New Patient (Gallstones )   Several weeks ago she had an acute episode of upper abdominal pain in the epigastric area and RUQ. It was very severe and prompted her to go to the ED on 12/13/21. The pain lasted for about 5 hours. Labs in the ED were significant for elevated transaminases and alk phos. Bilirubin and lipase were normal. She had a RUQ [place] that showed cholelithiasis without signs of cholecystitis. She was referred for outpatient follow up. She has had several episodes of pain since then, most recently last night. She denies any jaundice or darkening of her urine.   She has not had any prior abdominal surgeries.    Review of Systems: A complete review of systems was obtained from the patient.  I have reviewed this information and discussed as appropriate with the patient.  See HPI as well for other ROS.   Medical History: Past Medical History  History reviewed. No pertinent past medical history.        Patient Active Problem List  Diagnosis   Calculus of gallbladder without cholecystitis without obstruction      Past Surgical History  History reviewed. No pertinent surgical history.      Allergies  No Known Allergies           Current Outpatient Medications on File Prior to Visit  Medication Sig Dispense Refill   amLODIPine (NORVASC) 5 MG tablet Take 5 mg by mouth once daily       bisacodyL (DULCOLAX) 5 mg EC tablet TAKE 2 TABLETS (10 MG TOTAL) BY MOUTH DAILY AS NEEDED FOR MODERATE CONSTIPATION.       budesonide-formoteroL (SYMBICORT) 80-4.5 mcg/actuation inhaler Inhale into the lungs       diclofenac (VOLTAREN) 1 % topical gel Apply 4 g topically 4 (four) times daily       hydroCHLOROthiazide (HYDRODIURIL) 25 MG tablet Take 25 mg by mouth every morning       nitroGLYcerin (NITRODUR) 0.2 mg/hr patch Apply 1/4th patch to affected achilles, change  daily       chlorthalidone 25 MG tablet Take 25 mg by mouth once daily       pravastatin (PRAVACHOL) 20 MG tablet Take 20 mg by mouth once daily        No current facility-administered medications on file prior to visit.      Family History       Family History  Problem Relation Age of Onset   High blood pressure (Hypertension) Sister     Hyperlipidemia (Elevated cholesterol) Sister          Social History        Tobacco Use  Smoking Status Every Day   Types: Cigarettes  Smokeless Tobacco Current      Social History  Social History         Socioeconomic History   Marital status: Single  Tobacco Use   Smoking status: Every Day      Types: Cigarettes   Smokeless tobacco: Current  Substance and Sexual Activity   Alcohol use: Never   Drug use: Never        Objective:       Vitals:    12/31/21 1537  BP: 132/78  Pulse: 62  Temp: 36.6 C (97.8 F)  SpO2: 97%  Weight: (!) 122.4 kg (269 lb 12.8 oz)  Height: 167.6 cm (5'  6")    Body mass index is 43.55 kg/m.   Physical Exam Vitals reviewed.  Constitutional:      Appearance: Normal appearance.  Eyes:     General: No scleral icterus.    Conjunctiva/sclera: Conjunctivae normal.  Cardiovascular:     Rate and Rhythm: Normal rate and regular rhythm.  Pulmonary:     Effort: Pulmonary effort is normal. No respiratory distress.     Breath sounds: Normal breath sounds. No wheezing.  Abdominal:     General: There is no distension.     Palpations: Abdomen is soft.     Tenderness: There is no abdominal tenderness.  Musculoskeletal:        General: Normal range of motion.  Skin:    General: Skin is warm and dry.     Coloration: Skin is not jaundiced.  Neurological:     General: No focal deficit present.     Mental Status: She is alert and oriented to person, place, and time.  Psychiatric:        Mood and Affect: Mood normal.        Behavior: Behavior normal.        Thought Content: Thought content normal.           Assessment and Plan:     Diagnoses and all orders for this visit:   Calculus of gallbladder without cholecystitis without obstruction   Elevated LFTs -     Hepatic Function Panel (HFP)       This is a 61 yo female presenting with intermittent RUQ abdominal pain and elevated LFTs. Her symptoms are consistent with biliary colic. RUQ US showed gallstones and hepatic steatosis, both of which could contribute to elevated transaminases. Laparoscopic cholecystectomy with intraoperative cholangiogram was recommended. The details of this procedure were discussed with the patient, and she expressed understanding and agrees to proceed with surgery. Will also send patient for LFTs today to ensure they have normalized. Patient will be contacted to schedule an elective surgery date.  Allison Mcintyre, [facility], Hepatobiliary and Pancreatic Surgery 01/01/22 9:48 AM

## 2022-01-09 NOTE — Patient Instructions (Signed)
SURGICAL WAITING ROOM VISITATION Patients having surgery or a procedure may have no more than 2 support people in the waiting area - these visitors may rotate.   Children under the age of 30 must have an adult with them who is not the patient. If the patient needs to stay at the hospital during part of their recovery, the visitor guidelines for inpatient rooms apply. Pre-op nurse will coordinate an appropriate time for 1 support person to accompany patient in pre-op.  This support person may not rotate.    Please refer to the [facility] website for the visitor guidelines for Inpatients (after your surgery is over and you are in a regular room).    Your procedure is scheduled on: 01/19/22   Report to [facility] Main Entrance    Report to admitting at 5:15 AM   Call this number if you have problems the morning of surgery [phone]   Do not eat food :After Midnight.   After Midnight you may have the following liquids until 4:30 AM DAY OF SURGERY  Water Non-Citrus Juices (without pulp, NO RED) Carbonated Beverages Black Coffee (NO MILK/CREAM OR CREAMERS, sugar ok)  Clear Tea (NO MILK/CREAM OR CREAMERS, sugar ok) regular and decaf                             Plain Jell-O (NO RED)                                           Fruit ices (not with fruit pulp, NO RED)                                     Popsicles (NO RED)                                                               Sports drinks like Gatorade (NO RED)              FOLLOW BOWEL PREP AND ANY ADDITIONAL PRE OP INSTRUCTIONS YOU RECEIVED FROM YOUR SURGEON'S OFFICE!!!     Oral Hygiene is also important to reduce your risk of infection.                                    Remember - BRUSH YOUR TEETH THE MORNING OF SURGERY WITH YOUR REGULAR TOOTHPASTE   Do NOT smoke after Midnight   Take these medicines the morning of surgery with A SIP OF WATER: Inhaler, Pravastatin   DO NOT TAKE ANY ORAL DIABETIC MEDICATIONS DAY OF  YOUR SURGERY  Bring CPAP mask and tubing day of surgery.                              You may not have any metal on your body including hair pins, jewelry, and body piercing             Do not wear make-up, lotions,  powders, perfumes, or deodorant  Do not wear nail polish including gel and S&S, artificial/acrylic nails, or any other type of covering on natural nails including finger and toenails. If you have artificial nails, gel coating, etc. that needs to be removed by a nail salon please have this removed prior to surgery or surgery may need to be canceled/ delayed if the surgeon/ anesthesia feels like they are unable to be safely monitored.   Do not shave  48 hours prior to surgery.    Do not bring valuables to the hospital. [place].   Contacts, dentures or bridgework may not be worn into surgery.  DO NOT [place]. PHARMACY WILL DISPENSE MEDICATIONS LISTED ON YOUR MEDICATION LIST TO YOU DURING YOUR ADMISSION [place]!    Patients discharged on the day of surgery will not be allowed to drive home.  Someone NEEDS to stay with you for the first 24 hours after anesthesia.   Special Instructions: Bring a copy of your healthcare power of attorney and living will documents         the day of surgery if you haven't scanned them before.              Please read over the following fact sheets you were given: IF YOU HAVE QUESTIONS ABOUT YOUR PRE-OP INSTRUCTIONS PLEASE CALL [facility] - Preparing for Surgery Before surgery, you can play an important role.  Because skin is not sterile, your skin needs to be as free of germs as possible.  You can reduce the number of germs on your skin by washing with CHG (chlorahexidine gluconate) soap before surgery.  CHG is an antiseptic cleaner which kills germs and bonds with the skin to continue killing germs even after washing. Please DO NOT  use if you have an allergy to CHG or antibacterial soaps.  If your skin becomes reddened/irritated stop using the CHG and inform your nurse when you arrive at Short Stay. Do not shave (including legs and underarms) for at least 48 hours prior to the first CHG shower.  You may shave your face/neck.  Please follow these instructions carefully:  1.  Shower with CHG Soap the night before surgery and the  morning of surgery.  2.  If you choose to wash your hair, wash your hair first as usual with your normal  shampoo.  3.  After you shampoo, rinse your hair and body thoroughly to remove the shampoo.                             4.  Use CHG as you would any other liquid soap.  You can apply chg directly to the skin and wash.  Gently with a scrungie or clean washcloth.  5.  Apply the CHG Soap to your body ONLY FROM THE NECK DOWN.   Do   not use on face/ open                           Wound or open sores. Avoid contact with eyes, ears mouth and   genitals (private parts).                       Wash face,  Genitals (private parts) with your normal soap.             6.  Wash thoroughly, paying special attention to the area where your    surgery  will be performed.  7.  Thoroughly rinse your body with warm water from the neck down.  8.  DO NOT shower/wash with your normal soap after using and rinsing off the CHG Soap.                9.  Pat yourself dry with a clean towel.            10.  Wear clean pajamas.            11.  Place clean sheets on your bed the night of your first shower and do not  sleep with pets. Day of Surgery : Do not apply any lotions/deodorants the morning of surgery.  Please wear clean clothes to the hospital/surgery center.  FAILURE TO FOLLOW THESE INSTRUCTIONS MAY RESULT IN THE CANCELLATION OF YOUR SURGERY  PATIENT SIGNATURE_________________________________  NURSE  SIGNATURE__________________________________  ________________________________________________________________________

## 2022-01-09 NOTE — Progress Notes (Signed)
COVID Vaccine Completed: yes x3  Date of COVID positive in last 90 days:  PCP - Rick Duff, MD Cardiologist -   Chest x-ray -  EKG -  Stress Test -  ECHO - 2016 Cardiac Cath -  Pacemaker/ICD device last checked: Spinal Cord Stimulator:  Bowel Prep -   Sleep Study -  CPAP -   Fasting Blood Sugar -  Checks Blood Sugar _____ times a day  Blood Thinner Instructions: Aspirin Instructions: Last Dose:  Activity level:  Can go up a flight of stairs and perform activities of daily living without stopping and without symptoms of chest pain or shortness of breath.  Able to exercise without symptoms  Unable to go up a flight of stairs without symptoms of     Anesthesia review:   Patient denies shortness of breath, fever, cough and chest pain at PAT appointment  Patient verbalized understanding of instructions that were given to them at the PAT appointment. Patient was also instructed that they will need to review over the PAT instructions again at home before surgery.

## 2022-01-12 ENCOUNTER — Encounter (HOSPITAL_COMMUNITY): Payer: Self-pay

## 2022-01-12 ENCOUNTER — Encounter (HOSPITAL_COMMUNITY)
Admission: RE | Admit: 2022-01-12 | Discharge: 2022-01-12 | Disposition: A | Discharge status: Home / Self Care | Payer: BC Managed Care – PPO | Source: Ambulatory Visit | Attending: Surgery | Admitting: Surgery

## 2022-01-12 VITALS — BP 144/69 | HR 65 | Temp 98.0°F | Resp 17 | Ht 66.0 in | Wt 280.0 lb

## 2022-01-12 DIAGNOSIS — Z01818 Encounter for other preprocedural examination: Secondary | ICD-10-CM | POA: Diagnosis not present

## 2022-01-12 DIAGNOSIS — I10 Essential (primary) hypertension: Secondary | ICD-10-CM | POA: Insufficient documentation

## 2022-01-12 HISTORY — DX: Bronchitis, not specified as acute or chronic: J40

## 2022-01-12 HISTORY — DX: Headache, unspecified: R51.9

## 2022-01-12 LAB — BASIC METABOLIC PANEL
Anion gap: 11 (ref 5–15)
BUN: 11 mg/dL (ref 8–23)
CO2: 25 mmol/L (ref 22–32)
Calcium: 9.3 mg/dL (ref 8.9–10.3)
Chloride: 106 mmol/L (ref 98–111)
Creatinine, Ser: 0.56 mg/dL (ref 0.44–1.00)
GFR, Estimated: >60 mL/min (ref >60)
Glucose, Bld: 110 mg/dL — ABNORMAL HIGH (ref 70–99)
Potassium: 3.1 mmol/L — ABNORMAL LOW (ref 3.5–5.1)
Sodium: 142 mmol/L (ref 135–145)

## 2022-01-12 LAB — CBC
HCT: 37.4 % (ref 36.0–46.0)
Hemoglobin: 12.4 g/dL (ref 12.0–15.0)
MCH: 32 pg (ref 26.0–34.0)
MCHC: 33.2 g/dL (ref 30.0–36.0)
MCV: 96.6 fL (ref 80.0–100.0)
Platelets: 361 10*3/uL (ref 150–400)
RBC: 3.87 MIL/uL (ref 3.87–5.11)
RDW: 13.2 % (ref 11.5–15.5)
WBC: 5.8 10*3/uL (ref 4.0–10.5)
nRBC: 0.3 % — ABNORMAL HIGH (ref 0.0–0.2)

## 2022-01-18 NOTE — Anesthesia Preprocedure Evaluation (Addendum)
Anesthesia Evaluation  Patient identified by MRN, date of birth, ID band Patient awake    Reviewed: Allergy & Precautions, NPO status , Patient's Chart, lab work & pertinent test results  History of Anesthesia Complications Negative for: history of anesthetic complications  Airway Mallampati: II  TM Distance: >3 FB Neck ROM: Full    Dental  (+) Dental Advisory Given, Edentulous Upper, Edentulous Lower   Pulmonary Current Smoker and Patient abstained from smoking.,    Pulmonary exam normal        Cardiovascular hypertension, Pt. on medications Normal cardiovascular exam     Neuro/Psych negative neurological ROS     GI/Hepatic Neg liver ROS, Gallstones   Endo/Other  Morbid obesity  Renal/GU negative Renal ROS  negative genitourinary   Musculoskeletal negative musculoskeletal ROS (+)   Abdominal   Peds  Hematology negative hematology ROS (+)   Anesthesia Other Findings   Reproductive/Obstetrics                          Anesthesia Physical Anesthesia Plan  ASA: 3  Anesthesia Plan: General   Post-op Pain Management: Tylenol PO (pre-op)* and Toradol IV (intra-op)*   Induction: Intravenous  PONV Risk Score and Plan: 3 and Ondansetron, Dexamethasone, Treatment may vary due to age or medical condition and Midazolam  Airway Management Planned: Oral ETT  Additional Equipment: None  Intra-op Plan:   Post-operative Plan: Extubation in OR  Informed Consent: I have reviewed the patients History and Physical, chart, labs and discussed the procedure including the risks, benefits and alternatives for the proposed anesthesia with the patient or authorized representative who has indicated his/her understanding and acceptance.     Dental advisory given  Plan Discussed with:   Anesthesia Plan Comments:        Anesthesia Quick Evaluation

## 2022-01-19 ENCOUNTER — Other Ambulatory Visit: Payer: Self-pay

## 2022-01-19 ENCOUNTER — Ambulatory Visit (HOSPITAL_COMMUNITY): Payer: BC Managed Care – PPO

## 2022-01-19 ENCOUNTER — Inpatient Hospital Stay (HOSPITAL_COMMUNITY)
Admission: AD | Admit: 2022-01-19 | Discharge: 2022-01-21 | DRG: 418 | Disposition: A | Discharge status: Home / Self Care | Payer: BC Managed Care – PPO | Source: Ambulatory Visit | Attending: Surgery | Admitting: Surgery

## 2022-01-19 ENCOUNTER — Encounter (HOSPITAL_COMMUNITY)
Admission: AD | Disposition: A | Discharge status: Home / Self Care | Payer: Self-pay | Source: Ambulatory Visit | Attending: Surgery

## 2022-01-19 ENCOUNTER — Ambulatory Visit (HOSPITAL_COMMUNITY): Payer: BC Managed Care – PPO | Admitting: Anesthesiology

## 2022-01-19 ENCOUNTER — Encounter (HOSPITAL_COMMUNITY): Payer: Self-pay | Admitting: Surgery

## 2022-01-19 DIAGNOSIS — E785 Hyperlipidemia, unspecified: Secondary | ICD-10-CM | POA: Diagnosis present

## 2022-01-19 DIAGNOSIS — K8070 Calculus of gallbladder and bile duct without cholecystitis without obstruction: Principal | ICD-10-CM | POA: Diagnosis present

## 2022-01-19 DIAGNOSIS — K8050 Calculus of bile duct without cholangitis or cholecystitis without obstruction: Secondary | ICD-10-CM

## 2022-01-19 DIAGNOSIS — Z79899 Other long term (current) drug therapy: Secondary | ICD-10-CM

## 2022-01-19 DIAGNOSIS — Z90710 Acquired absence of both cervix and uterus: Secondary | ICD-10-CM

## 2022-01-19 DIAGNOSIS — K5909 Other constipation: Secondary | ICD-10-CM | POA: Diagnosis present

## 2022-01-19 DIAGNOSIS — F17210 Nicotine dependence, cigarettes, uncomplicated: Secondary | ICD-10-CM | POA: Diagnosis present

## 2022-01-19 DIAGNOSIS — Z6841 Body Mass Index (BMI) 40.0 and over, adult: Secondary | ICD-10-CM

## 2022-01-19 DIAGNOSIS — Z7951 Long term (current) use of inhaled steroids: Secondary | ICD-10-CM

## 2022-01-19 DIAGNOSIS — Z8249 Family history of ischemic heart disease and other diseases of the circulatory system: Secondary | ICD-10-CM

## 2022-01-19 DIAGNOSIS — I10 Essential (primary) hypertension: Secondary | ICD-10-CM | POA: Diagnosis present

## 2022-01-19 DIAGNOSIS — Z825 Family history of asthma and other chronic lower respiratory diseases: Secondary | ICD-10-CM

## 2022-01-19 DIAGNOSIS — Z800 Family history of malignant neoplasm of digestive organs: Secondary | ICD-10-CM

## 2022-01-19 HISTORY — PX: CHOLECYSTECTOMY: SHX55

## 2022-01-19 LAB — COMPREHENSIVE METABOLIC PANEL
ALT: 54 U/L — ABNORMAL HIGH (ref 0–44)
AST: 49 U/L — ABNORMAL HIGH (ref 15–41)
Albumin: 3.6 g/dL (ref 3.5–5.0)
Alkaline Phosphatase: 89 U/L (ref 38–126)
Anion gap: 8 (ref 5–15)
BUN: 16 mg/dL (ref 8–23)
CO2: 24 mmol/L (ref 22–32)
Calcium: 9 mg/dL (ref 8.9–10.3)
Chloride: 109 mmol/L (ref 98–111)
Creatinine, Ser: 0.7 mg/dL (ref 0.44–1.00)
GFR, Estimated: >60 mL/min (ref >60)
Glucose, Bld: 215 mg/dL — ABNORMAL HIGH (ref 70–99)
Potassium: 3.6 mmol/L (ref 3.5–5.1)
Sodium: 141 mmol/L (ref 135–145)
Total Bilirubin: 0.6 mg/dL (ref 0.3–1.2)
Total Protein: 7.1 g/dL (ref 6.5–8.1)

## 2022-01-19 SURGERY — LAPAROSCOPIC CHOLECYSTECTOMY
Anesthesia: General

## 2022-01-19 MED ORDER — SUGAMMADEX SODIUM 500 MG/5ML IV SOLN
INTRAVENOUS | Status: AC
  Filled 2022-01-19: qty 5

## 2022-01-19 MED ORDER — AMLODIPINE BESYLATE 5 MG PO TABS
5.0000 mg | ORAL_TABLET | Freq: Every day | ORAL | Status: DC
  Administered 2022-01-21: 5 mg via ORAL
  Filled 2022-01-19 (×2): qty 1

## 2022-01-19 MED ORDER — 0.9 % SODIUM CHLORIDE (POUR BTL) OPTIME
TOPICAL | Status: DC | PRN
  Administered 2022-01-19: 1000 mL

## 2022-01-19 MED ORDER — GLUCAGON HCL RDNA (DIAGNOSTIC) 1 MG IJ SOLR
INTRAMUSCULAR | Status: AC
  Filled 2022-01-19: qty 1

## 2022-01-19 MED ORDER — ONDANSETRON HCL 4 MG/2ML IJ SOLN: 4.0000 mg | Freq: Once | INTRAMUSCULAR | Status: DC | PRN

## 2022-01-19 MED ORDER — AMISULPRIDE (ANTIEMETIC) 5 MG/2ML IV SOLN: 10.0000 mg | Freq: Once | INTRAVENOUS | Status: DC | PRN

## 2022-01-19 MED ORDER — OXYCODONE HCL 5 MG PO TABS: 5.0000 mg | ORAL_TABLET | Freq: Once | ORAL | Status: DC | PRN

## 2022-01-19 MED ORDER — FENTANYL CITRATE (PF) 100 MCG/2ML IJ SOLN
INTRAMUSCULAR | Status: AC
  Filled 2022-01-19: qty 2

## 2022-01-19 MED ORDER — CHLORHEXIDINE GLUCONATE 0.12 % MT SOLN
15.0000 mL | Freq: Once | OROMUCOSAL | Status: AC
  Administered 2022-01-19: 15 mL via OROMUCOSAL

## 2022-01-19 MED ORDER — ONDANSETRON HCL 4 MG/2ML IJ SOLN: 4.0000 mg | Freq: Four times a day (QID) | INTRAMUSCULAR | Status: DC | PRN

## 2022-01-19 MED ORDER — ROCURONIUM BROMIDE 10 MG/ML (PF) SYRINGE
PREFILLED_SYRINGE | INTRAVENOUS | Status: AC
  Filled 2022-01-19: qty 10

## 2022-01-19 MED ORDER — DEXAMETHASONE SODIUM PHOSPHATE 10 MG/ML IJ SOLN
INTRAMUSCULAR | Status: AC
  Filled 2022-01-19: qty 1

## 2022-01-19 MED ORDER — DOCUSATE SODIUM 100 MG PO CAPS
100.0000 mg | ORAL_CAPSULE | Freq: Two times a day (BID) | ORAL | Status: DC
  Administered 2022-01-19 – 2022-01-21 (×4): 100 mg via ORAL
  Filled 2022-01-19 (×4): qty 1

## 2022-01-19 MED ORDER — PROPOFOL 10 MG/ML IV BOLUS
INTRAVENOUS | Status: AC
  Filled 2022-01-19: qty 20

## 2022-01-19 MED ORDER — PRAVASTATIN SODIUM 20 MG PO TABS
20.0000 mg | ORAL_TABLET | Freq: Every day | ORAL | Status: DC
  Administered 2022-01-20 – 2022-01-21 (×2): 20 mg via ORAL
  Filled 2022-01-19 (×2): qty 1

## 2022-01-19 MED ORDER — DEXAMETHASONE SODIUM PHOSPHATE 10 MG/ML IJ SOLN
INTRAMUSCULAR | Status: DC | PRN
  Administered 2022-01-19: 4 mg via INTRAVENOUS

## 2022-01-19 MED ORDER — OXYCODONE HCL 5 MG/5ML PO SOLN: 5.0000 mg | Freq: Once | ORAL | Status: DC | PRN

## 2022-01-19 MED ORDER — METHOCARBAMOL 500 MG PO TABS
750.0000 mg | ORAL_TABLET | Freq: Four times a day (QID) | ORAL | Status: DC
  Administered 2022-01-19 – 2022-01-21 (×7): 750 mg via ORAL
  Filled 2022-01-19 (×7): qty 2

## 2022-01-19 MED ORDER — HEMOSTATIC AGENTS (NO CHARGE) OPTIME
TOPICAL | Status: DC | PRN
  Administered 2022-01-19: 1 via TOPICAL

## 2022-01-19 MED ORDER — FENTANYL CITRATE PF 50 MCG/ML IJ SOSY
PREFILLED_SYRINGE | INTRAMUSCULAR | Status: AC
  Filled 2022-01-19: qty 1

## 2022-01-19 MED ORDER — LACTATED RINGERS IR SOLN
Status: DC | PRN
  Administered 2022-01-19: 1000 mL

## 2022-01-19 MED ORDER — ROCURONIUM BROMIDE 10 MG/ML (PF) SYRINGE
PREFILLED_SYRINGE | INTRAVENOUS | Status: DC | PRN
  Administered 2022-01-19: 5 mg via INTRAVENOUS
  Administered 2022-01-19: 70 mg via INTRAVENOUS

## 2022-01-19 MED ORDER — GLUCAGON HCL RDNA (DIAGNOSTIC) 1 MG IJ SOLR
INTRAMUSCULAR | Status: DC | PRN
  Administered 2022-01-19: .5 mg via INTRAVENOUS

## 2022-01-19 MED ORDER — ALBUTEROL SULFATE (2.5 MG/3ML) 0.083% IN NEBU
2.5000 mg | INHALATION_SOLUTION | Freq: Four times a day (QID) | RESPIRATORY_TRACT | Status: DC | PRN
Start: 2022-01-19 — End: 2022-01-21

## 2022-01-19 MED ORDER — MIDAZOLAM HCL 2 MG/2ML IJ SOLN
INTRAMUSCULAR | Status: AC
  Filled 2022-01-19: qty 2

## 2022-01-19 MED ORDER — FENTANYL CITRATE (PF) 100 MCG/2ML IJ SOLN
INTRAMUSCULAR | Status: DC | PRN
  Administered 2022-01-19 (×4): 50 ug via INTRAVENOUS

## 2022-01-19 MED ORDER — OXYCODONE HCL 5 MG PO TABS
5.0000 mg | ORAL_TABLET | ORAL | Status: DC | PRN
  Administered 2022-01-19 – 2022-01-20 (×3): 5 mg via ORAL
  Filled 2022-01-19 (×3): qty 1

## 2022-01-19 MED ORDER — ONDANSETRON HCL 4 MG/2ML IJ SOLN
INTRAMUSCULAR | Status: DC | PRN
  Administered 2022-01-19: 4 mg via INTRAVENOUS

## 2022-01-19 MED ORDER — LIDOCAINE 2% (20 MG/ML) 5 ML SYRINGE
INTRAMUSCULAR | Status: DC | PRN
  Administered 2022-01-19: 60 mg via INTRAVENOUS

## 2022-01-19 MED ORDER — BUPIVACAINE-EPINEPHRINE (PF) 0.25% -1:200000 IJ SOLN
INTRAMUSCULAR | Status: AC
  Filled 2022-01-19: qty 30

## 2022-01-19 MED ORDER — ORAL CARE MOUTH RINSE: 15.0000 mL | Freq: Once | OROMUCOSAL | Status: AC

## 2022-01-19 MED ORDER — ACETAMINOPHEN 500 MG PO TABS
1000.0000 mg | ORAL_TABLET | Freq: Once | ORAL | Status: AC
Start: 2022-01-19 — End: 2022-01-19
  Administered 2022-01-19: 1000 mg via ORAL
  Filled 2022-01-19: qty 2

## 2022-01-19 MED ORDER — ONDANSETRON HCL 4 MG/2ML IJ SOLN
INTRAMUSCULAR | Status: AC
Start: 2022-01-19 — End: ?
  Filled 2022-01-19: qty 2

## 2022-01-19 MED ORDER — IOHEXOL 300 MG/ML  SOLN
INTRAMUSCULAR | Status: DC | PRN
  Administered 2022-01-19: 40 mL

## 2022-01-19 MED ORDER — MIDAZOLAM HCL 2 MG/2ML IJ SOLN
INTRAMUSCULAR | Status: DC | PRN
  Administered 2022-01-19: 2 mg via INTRAVENOUS

## 2022-01-19 MED ORDER — VERAPAMIL HCL ER 120 MG PO TBCR
120.0000 mg | EXTENDED_RELEASE_TABLET | Freq: Every day | ORAL | Status: DC
  Administered 2022-01-19 – 2022-01-21 (×3): 120 mg via ORAL
  Filled 2022-01-19 (×3): qty 1

## 2022-01-19 MED ORDER — FLUTICASONE FUROATE-VILANTEROL 100-25 MCG/ACT IN AEPB
1.0000 | INHALATION_SPRAY | Freq: Every day | RESPIRATORY_TRACT | Status: DC
  Administered 2022-01-19 – 2022-01-21 (×3): 1 via RESPIRATORY_TRACT
  Filled 2022-01-19: qty 28

## 2022-01-19 MED ORDER — DIPHENHYDRAMINE HCL 12.5 MG/5ML PO ELIX: 12.5000 mg | ORAL_SOLUTION | Freq: Four times a day (QID) | ORAL | Status: DC | PRN

## 2022-01-19 MED ORDER — DIPHENHYDRAMINE HCL 50 MG/ML IJ SOLN: 12.5000 mg | Freq: Four times a day (QID) | INTRAMUSCULAR | Status: DC | PRN

## 2022-01-19 MED ORDER — ONDANSETRON 4 MG PO TBDP: 4.0000 mg | ORAL_TABLET | Freq: Four times a day (QID) | ORAL | Status: DC | PRN

## 2022-01-19 MED ORDER — LACTATED RINGERS IV SOLN: INTRAVENOUS | Status: DC

## 2022-01-19 MED ORDER — LIDOCAINE HCL (PF) 2 % IJ SOLN
INTRAMUSCULAR | Status: AC
  Filled 2022-01-19: qty 5

## 2022-01-19 MED ORDER — CEFAZOLIN IN SODIUM CHLORIDE 3-0.9 GM/100ML-% IV SOLN
3.0000 g | INTRAVENOUS | Status: AC
  Administered 2022-01-19: 3 g via INTRAVENOUS
  Filled 2022-01-19: qty 100

## 2022-01-19 MED ORDER — LACTATED RINGERS IV SOLN
INTRAVENOUS | Status: DC
Start: 2022-01-19 — End: 2022-01-19

## 2022-01-19 MED ORDER — FENTANYL CITRATE PF 50 MCG/ML IJ SOSY
25.0000 ug | PREFILLED_SYRINGE | INTRAMUSCULAR | Status: DC | PRN
  Administered 2022-01-19 (×3): 50 ug via INTRAVENOUS

## 2022-01-19 MED ORDER — BUPIVACAINE-EPINEPHRINE 0.25% -1:200000 IJ SOLN
INTRAMUSCULAR | Status: DC | PRN
  Administered 2022-01-19: 30 mL

## 2022-01-19 MED ORDER — HYDROMORPHONE HCL 1 MG/ML IJ SOLN
0.2500 mg | INTRAMUSCULAR | Status: DC | PRN
  Administered 2022-01-19 (×2): 0.5 mg via INTRAVENOUS

## 2022-01-19 MED ORDER — PROPOFOL 10 MG/ML IV BOLUS
INTRAVENOUS | Status: DC | PRN
  Administered 2022-01-19: 130 mg via INTRAVENOUS

## 2022-01-19 MED ORDER — ACETAMINOPHEN 500 MG PO TABS
1000.0000 mg | ORAL_TABLET | Freq: Three times a day (TID) | ORAL | Status: DC
Start: 2022-01-19 — End: 2022-01-21
  Administered 2022-01-19 – 2022-01-21 (×6): 1000 mg via ORAL
  Filled 2022-01-19 (×6): qty 2

## 2022-01-19 MED ORDER — HYDROMORPHONE HCL 1 MG/ML IJ SOLN: 0.5000 mg | INTRAMUSCULAR | Status: DC | PRN

## 2022-01-19 MED ORDER — SUGAMMADEX SODIUM 500 MG/5ML IV SOLN
INTRAVENOUS | Status: DC | PRN
  Administered 2022-01-19: 260 mg via INTRAVENOUS

## 2022-01-19 MED ORDER — HYDROMORPHONE HCL 1 MG/ML IJ SOLN
INTRAMUSCULAR | Status: AC
  Filled 2022-01-19: qty 1

## 2022-01-19 SURGICAL SUPPLY — 58 items
ADH SKN CLS APL DERMABOND .7 (GAUZE/BANDAGES/DRESSINGS) ×2
APL PRP STRL LF DISP 70% ISPRP (MISCELLANEOUS) ×2
APL SRG 38 LTWT LNG FL B (MISCELLANEOUS) ×1
APPLICATOR ARISTA FLEXITIP XL (MISCELLANEOUS) ×1 IMPLANT
APPLIER CLIP 5 13 M/L LIGAMAX5 (MISCELLANEOUS) ×2
APR CLP MED LRG 5 ANG JAW (MISCELLANEOUS) ×1
BAG COUNTER SPONGE SURGICOUNT (BAG) IMPLANT
BAG SPEC RTRVL 10 TROC 200 (ENDOMECHANICALS) ×1
BAG SPNG CNTER NS LX DISP (BAG)
CHLORAPREP W/TINT 26 (MISCELLANEOUS) ×3 IMPLANT
CLIP APPLIE 5 13 M/L LIGAMAX5 (MISCELLANEOUS) ×1 IMPLANT
COVER MAYO STAND XLG (MISCELLANEOUS) ×2 IMPLANT
COVER SURGICAL LIGHT HANDLE (MISCELLANEOUS) ×2 IMPLANT
DERMABOND ADVANCED (GAUZE/BANDAGES/DRESSINGS) ×2
DERMABOND ADVANCED .7 DNX12 (GAUZE/BANDAGES/DRESSINGS) ×1 IMPLANT
DRAPE C-ARM 42X120 X-RAY (DRAPES) ×1 IMPLANT
ELECT L-HOOK LAP 45CM DISP (ELECTROSURGICAL)
ELECT PENCIL ROCKER SW 15FT (MISCELLANEOUS) ×2 IMPLANT
ELECT REM PT RETURN 15FT ADLT (MISCELLANEOUS) ×2 IMPLANT
ELECTRODE L-HOOK LAP 45CM DISP (ELECTROSURGICAL) IMPLANT
ENDOLOOP SUT PDS II  0 18 (SUTURE) ×2
ENDOLOOP SUT PDS II 0 18 (SUTURE) IMPLANT
GLOVE BIOGEL PI IND STRL 6 (GLOVE) ×1 IMPLANT
GLOVE BIOGEL PI INDICATOR 6 (GLOVE) ×1
GLOVE BIOGEL PI MICRO 5.5 (GLOVE) ×1
GLOVE BIOGEL PI MICRO STRL 5.5 (GLOVE) ×1 IMPLANT
GLOVE SS PI  5.5 STRL (GLOVE) ×2
GLOVE SS PI 5.5 STRL (GLOVE) ×1 IMPLANT
GOWN STRL REUS W/ TWL LRG LVL3 (GOWN DISPOSABLE) ×1 IMPLANT
GOWN STRL REUS W/TWL LRG LVL3 (GOWN DISPOSABLE) ×2
GRASPER SUT TROCAR 14GX15 (MISCELLANEOUS) IMPLANT
HEMOSTAT ARISTA ABSORB 3G PWDR (HEMOSTASIS) ×1 IMPLANT
HEMOSTAT SNOW SURGICEL 2X4 (HEMOSTASIS) IMPLANT
IRRIG SUCT STRYKERFLOW 2 WTIP (MISCELLANEOUS) ×2
IRRIGATION SUCT STRKRFLW 2 WTP (MISCELLANEOUS) ×1 IMPLANT
KIT BASIN OR (CUSTOM PROCEDURE TRAY) ×2 IMPLANT
KIT TURNOVER KIT A (KITS) IMPLANT
L-HOOK LAP DISP 36CM (ELECTROSURGICAL) ×2
LHOOK LAP DISP 36CM (ELECTROSURGICAL) ×1 IMPLANT
NDL INSUFFLATION 14GA 120MM (NEEDLE) IMPLANT
NEEDLE INSUFFLATION 14GA 120MM (NEEDLE) IMPLANT
POUCH RETRIEVAL ECOSAC 10 (ENDOMECHANICALS) IMPLANT
POUCH RETRIEVAL ECOSAC 10MM (ENDOMECHANICALS) ×2
SCISSORS LAP 5X35 DISP (ENDOMECHANICALS) ×2 IMPLANT
SET CHOLANGIOGRAPH MIX (MISCELLANEOUS) ×1 IMPLANT
SET TUBE SMOKE EVAC HIGH FLOW (TUBING) ×2 IMPLANT
SLEEVE Z-THREAD 5X100MM (TROCAR) ×5 IMPLANT
SPIKE FLUID TRANSFER (MISCELLANEOUS) ×2 IMPLANT
SUT MNCRL AB 4-0 PS2 18 (SUTURE) ×3 IMPLANT
SUT VICRYL 0 UR6 27IN ABS (SUTURE) ×2 IMPLANT
SYS BAG RETRIEVAL 10MM (BASKET) ×2
SYSTEM BAG RETRIEVAL 10MM (BASKET) ×1 IMPLANT
TOWEL OR 17X26 10 PK STRL BLUE (TOWEL DISPOSABLE) ×2 IMPLANT
TOWEL OR NON WOVEN STRL DISP B (DISPOSABLE) IMPLANT
TRAY LAPAROSCOPIC (CUSTOM PROCEDURE TRAY) ×2 IMPLANT
TROCAR ADV FIXATION 12X100MM (TROCAR) IMPLANT
TROCAR BALLN 12MMX100 BLUNT (TROCAR) ×1 IMPLANT
TROCAR Z-THREAD OPTICAL 5X100M (TROCAR) ×2 IMPLANT

## 2022-01-19 NOTE — H&P (View-Only) (Signed)
Consultation Note   Referring Provider: [facility] PCP: Allison Duff, MD Primary Gastroenterologist: .Allison Jarred, MD Reason for consultation:   Bile duct stones Hospital Day: 1  Assessment / Plan   # 61 yo female with symptomatic cholelithiasis s/p laparoscopic cholecystectomy with suggestive of choledocholithiasis. Liver enzymes minimally elevated, alk phos / bilirubin normal.  Dr. Carlean Mcintyre reviewed Allison Mcintyre.  Will plan for ERCP with stones extraction tomorrow. The benefits of the procedure, as well as the risks not limited to infection, perforation, bleeding and pancreatitis were explained to the patient and she agrees to proceed.    # Chronic constipation. Apparently takes something at home for it but not listed on home med list. Has colace ordered in house.    # See PMH for additional medical problems    [facility] Attending   I have taken an interval history, reviewed the chart and examined the patient. I agree with the Advanced Practitioner's note, impression and recommendations.    She has choledocholithiasis and ERCP with sphincterotomy and stone extraction is planned for tomorrow.  The risks and benefits as well as alternatives of endoscopic procedure(s) have been discussed and reviewed. All questions answered. The patient agrees to proceed.  Allison Mayer, MD, [facility] See Allison Mcintyre on call - gastroenterology for best contact person 01/19/2022 5:46 PM   HPI   Allison Mcintyre is a 61 y.o. female with a past medical history significant for   See PMH for any additional medical problems.  Allison Mcintyre is known to Dr. Hilarie Mcintyre for history of colon polyps.   Patient was seen in ED in June for abdominal pain. Liver enzymes were elevated with AST of 220 / ALT 363. They were normal in 2020. RUQ US showed fatty liver and cholelithiasis without cholecystitis. She was referred for outpatient surgical  evaluation. She was seen 12/31/21 by Dr. Zenia Mcintyre with [facility]. She had continued to have episodes of abdominal pain but repeat LFTs were nearly normal ( just mildly elevated ALT). She was scheduled for a lap cholecystectomy.   Today patient underwent laparoscopic cholecystectomy. IOC suspicious for partially obstructive choledocholithiasis in distal CBD. Her liver enzymes are only mildly elevated, alk phos and bilirubin are normal.   Allison Mcintyre endorses chronic constipation. She is unable to recall what she takes for constipation and I don't see anything on her outpatient med list.    Previous GI Evaluation    Jan 2021 surveillance colonoscopy  --normal other than hemorrhoids   Recent Labs and Imaging DG Cholangiogram Operative  Result Date: 01/19/2022 CLINICAL DATA:  Intraoperative cholangiogram EXAM: INTRAOPERATIVE CHOLANGIOGRAM TECHNIQUE: Cholangiographic images from the C-arm fluoroscopic device were submitted for interpretation post-operatively. Please see the procedural report for the amount of contrast and the fluoroscopy time utilized. FLUOROSCOPY: Refer to separate report COMPARISON:  None Available. FINDINGS: A total of 3 fluoroscopic loops are submitted for review taken during intraoperative cholangiogram. Images demonstrate surgical instrumentation and clips overlying the gallbladder fossa. Injection via the cystic duct stump opacifies both the intrahepatic and extrahepatic biliary tree. The intrahepatic biliary ducts are normal in caliber. There appear to be small filling defects within the distal common bile duct suspicious for partially obstructing choledocholithiasis given that there is passage of contrast material through  the ampulla into the small bowel. Additional injection of the biliary system on subsequent cine loops demonstrate preferential opacification of the intrahepatic biliary ducts. There is faint passage of contrast material into the common bile duct without definite passage of  contrast material through the ampulla. IMPRESSION: Intraoperative cholangiogram as described. Initial injection of the biliary system is suspicious for partially obstructive choledocholithiasis in the distal CBD. Refer to operative report for full details. These results were called by telephone at the time of interpretation on 01/19/2022 at 1000 to provider [facility] , who verbally acknowledged these results. Electronically Signed   By: Albin Felling M.D.   On: 01/19/2022 09:58    Labs:  No results for input(s): "WBC", "HGB", "HCT", "PLT" in the last 72 hours. Recent Labs    01/19/22 1030  NA 141  K 3.6  CL 109  CO2 24  GLUCOSE 215*  BUN 16  CREATININE 0.70  CALCIUM 9.0   Recent Labs    01/19/22 1030  PROT 7.1  ALBUMIN 3.6  AST 49*  ALT 54*  ALKPHOS 89  BILITOT 0.6   No results for input(s): "HEPBSAG", "HCVAB", "HEPAIGM", "HEPBIGM" in the last 72 hours. No results for input(s): "LABPROT", "INR" in the last 72 hours.  Past Medical History:  Diagnosis Date   Anemia    Bronchitis    Headache    Hemorrhoids    Hyperlipidemia    Hypertension    Menorrhagia     Past Surgical History:  Procedure Laterality Date   ABDOMINAL HYSTERECTOMY  2003   CESAREAN SECTION     COLONOSCOPY     POLYPECTOMY      Family History  Problem Relation Age of Onset   Cancer Mother    Pancreatic cancer Mother    Bronchitis Father    Colon polyps Neg Hx    Esophageal cancer Neg Hx    Stomach cancer Neg Hx    Rectal cancer Neg Hx    Colon cancer Neg Hx    Breast cancer Neg Hx     Prior to Admission medications   Medication Sig Start Date End Date Taking? Authorizing Provider  albuterol (VENTOLIN HFA) 108 (90 Base) MCG/ACT inhaler INHALE 1 TO 2 PUFFS BY MOUTH EVERY 6 HOURS AS NEEDED FOR WHEEZING OR SHORTNESS OF BREATH Patient taking differently: Inhale 1-2 puffs into the lungs every 6 (six) hours as needed for wheezing or shortness of breath. 05/05/21  Yes Allison Duff, MD   amLODipine (NORVASC) 5 MG tablet Take 5 mg by mouth daily.   Yes [provider]  budesonide-formoterol (SYMBICORT) 80-4.5 MCG/ACT inhaler Inhale 2 puffs into the lungs daily. Patient taking differently: Inhale 2 puffs into the lungs daily as needed (for flares). 04/27/21  Yes White, Adrienne R, NP  chlorthalidone (HYGROTON) 25 MG tablet Take 1 tablet (25 mg total) by mouth daily. 04/09/21 04/09/22 Yes Timothy Lasso, MD  diclofenac Sodium (VOLTAREN) 1 % GEL Apply 4 g topically 4 (four) times daily. Patient taking differently: Apply 4 g topically See admin instructions. Apply 4 grams of gel to affected areas four times a day 03/20/21  Yes Aslam, Sadia, MD  fluticasone (FLONASE) 50 MCG/ACT nasal spray Place 2 sprays into both nostrils daily. Patient taking differently: Place 2 sprays into both nostrils daily as needed for allergies or rhinitis. 02/23/21  Yes Melynda Ripple, MD  hydrochlorothiazide (HYDRODIURIL) 25 MG tablet Take 25 mg by mouth in the morning.   Yes [provider]  nitroGLYCERIN (NITRODUR -  DOSED IN MG/24 HR) 0.2 mg/hr patch Apply 1/4th patch to affected achilles, change daily Patient taking differently: Place 0.05 mg onto the skin daily as needed (as directed to affected achilles (1/4 patch)). 12/17/21  Yes Hudnall, Sharyn Lull, MD  pravastatin (PRAVACHOL) 20 MG tablet TAKE 1 TABLET (20 MG TOTAL) BY MOUTH DAILY. 01/01/22  Yes Allison Duff, MD  verapamil (CALAN-SR) 120 MG CR tablet Take 1 tablet (120 mg total) by mouth daily. 10/27/21 10/22/22 Yes Iona Beard, MD    Current Facility-Administered Medications  Medication Dose Route Frequency Provider Last Rate Last Admin   acetaminophen (TYLENOL) tablet 1,000 mg  1,000 mg Oral TID Dwan Bolt, MD       albuterol (PROVENTIL) (2.5 MG/3ML) 0.083% nebulizer solution 2.5 mg  2.5 mg Inhalation Q6H PRN Dwan Bolt, MD       [START ON 01/20/2022] amLODipine (NORVASC) tablet 5 mg  5 mg Oral Daily Dwan Bolt, MD        diphenhydrAMINE (BENADRYL) 12.5 MG/5ML elixir 12.5 mg  12.5 mg Oral Q6H PRN Dwan Bolt, MD       Or   diphenhydrAMINE (BENADRYL) injection 12.5 mg  12.5 mg Intravenous Q6H PRN Dwan Bolt, MD       docusate sodium (COLACE) capsule 100 mg  100 mg Oral BID Dwan Bolt, MD       fentaNYL (SUBLIMAZE) 50 MCG/ML injection            fentaNYL (SUBLIMAZE) 50 MCG/ML injection            fentaNYL (SUBLIMAZE) 50 MCG/ML injection            fluticasone furoate-vilanterol (BREO ELLIPTA) 100-25 MCG/ACT 1 puff  1 puff Inhalation Daily Dwan Bolt, MD       HYDROmorphone (DILAUDID) 1 MG/ML injection            HYDROmorphone (DILAUDID) injection 0.5 mg  0.5 mg Intravenous Q4H PRN Dwan Bolt, MD       lactated ringers infusion   Intravenous Continuous Dwan Bolt, MD       methocarbamol (ROBAXIN) tablet 750 mg  750 mg Oral QID Dwan Bolt, MD       ondansetron (ZOFRAN-ODT) disintegrating tablet 4 mg  4 mg Oral Q6H PRN Dwan Bolt, MD       Or   ondansetron [facility]) injection 4 mg  4 mg Intravenous Q6H PRN Dwan Bolt, MD       oxyCODONE (Oxy IR/ROXICODONE) immediate release tablet 5 mg  5 mg Oral Q4H PRN Dwan Bolt, MD       [START ON 01/20/2022] pravastatin (PRAVACHOL) tablet 20 mg  20 mg Oral Daily Dwan Bolt, MD       verapamil (CALAN-SR) CR tablet 120 mg  120 mg Oral Daily Dwan Bolt, MD        Allergies as of 01/09/2022   (No Known Allergies)    Social History   Socioeconomic History   Marital status: Single    Spouse name: Not on file   Number of children: Not on file   Years of education: Not on file   Highest education level: Not on file  Occupational History   Not on file  Tobacco Use   Smoking status: Every Day    Packs/day: 0.25    Types: Cigarettes   Smokeless tobacco: Never   Tobacco comments:    5 per day  Vaping Use  Vaping Use: Never used  Substance and Sexual Activity   Alcohol use: Yes    Alcohol/week: 3.0  standard drinks of alcohol    Types: 3 Cans of beer per week    Comment: occasional , about weekly   Drug use: No   Sexual activity: Yes    Birth control/protection: Surgical  Other Topics Concern   Not on file  Social History Narrative   Not on file   Social Determinants of Health   Financial Resource Strain: Not on file  Food Insecurity: Not on file  Transportation Needs: Not on file  Physical Activity: Not on file  Stress: Not on file  Social Connections: Not on file  Intimate Partner Violence: Not on file    Review of Systems: All systems reviewed and negative except where noted in HPI.  Physical Exam: Vital signs in last 24 hours: Temp:  [97.8 F (36.6 C)-98.2 F (36.8 C)] 98.1 F (36.7 C) (08/07 1137) Pulse Rate:  [52-76] 59 (08/07 1137) Resp:  [15-19] 16 (08/07 1137) BP: (106-168)/(75-103) 160/85 (08/07 1137) SpO2:  [88 %-99 %] 99 % (08/07 1137) Weight:  [127 kg] 127 kg (08/07 0600)    General:  Alert obese pleasant female in NAD Psych:  Cooperative. Normal mood and affect Eyes: Pupils equal, no icterus. Conjunctive pink Ears:  Normal auditory acuity Nose: No deformity, discharge or lesions Neck:  Supple, no masses felt Lungs:  Clear to auscultation.  Heart:  Regular rate, regular rhythm. No lower extremity edema Abdomen:  Soft, nondistended, nontender, active bowel sounds, no masses felt. No drainage for op sites Rectal :  Deferred Msk: Symmetrical without gross deformities.  Neurologic:  Alert, oriented, grossly normal neurologically Skin:  Intact without significant lesions.    Intake/Output from previous day: No intake/output data recorded. Intake/Output this shift:  Total I/O In: 900 [I.V.:800; IV Piggyback:100] Out: 20 [Blood:20]    Principal Problem:   Choledocholithiasis    Tye Savoy, NP-C @  01/19/2022, 12:37 PM

## 2022-01-19 NOTE — Transfer of Care (Signed)
Immediate Anesthesia Transfer of Care Note  Patient: Allison Mcintyre  Procedure(s) Performed: LAPAROSCOPIC CHOLECYSTECTOMY WITH INTRAOPERATIVE CHOLANGIOGRAM  Patient Location: PACU  Anesthesia Type:General  Level of Consciousness: awake, alert  and oriented  Airway & Oxygen Therapy: Patient Spontanous Breathing and Patient connected to face mask oxygen  Post-op Assessment: Report given to RN, Post -op Vital signs reviewed and stable and Patient moving all extremities X 4  Post vital signs: Reviewed and stable  Last Vitals:  Vitals Value Taken Time  BP 106/82   Temp    Pulse 73 01/19/22 0950  Resp 15 01/19/22 0950  SpO2 93 % 01/19/22 0950  Vitals shown include unvalidated device data.  Last Pain:  Vitals:   01/19/22 0600  TempSrc: Oral  PainSc: 0-No pain      Patients Stated Pain Goal: 3 (09/98/33 8250)  Complications: No notable events documented.

## 2022-01-19 NOTE — Consult Note (Addendum)
Consultation Note   Referring Provider: [facility] PCP: Rick Duff, MD Primary Gastroenterologist: .Zenovia Jarred, MD Reason for consultation:   Bile duct stones Hospital Day: 1  Assessment / Plan   # 61 yo female with symptomatic cholelithiasis s/p laparoscopic cholecystectomy with suggestive of choledocholithiasis. Liver enzymes minimally elevated, alk phos / bilirubin normal.  Dr. Carlean Purl reviewed [name].  Will plan for ERCP with stones extraction tomorrow. The benefits of the procedure, as well as the risks not limited to infection, perforation, bleeding and pancreatitis were explained to the patient and she agrees to proceed.    # Chronic constipation. Apparently takes something at home for it but not listed on home med list. Has colace ordered in house.    # See PMH for additional medical problems    [facility] Attending   I have taken an interval history, reviewed the chart and examined the patient. I agree with the Advanced Practitioner's note, impression and recommendations.    She has choledocholithiasis and ERCP with sphincterotomy and stone extraction is planned for tomorrow.  The risks and benefits as well as alternatives of endoscopic procedure(s) have been discussed and reviewed. All questions answered. The patient agrees to proceed.  Allison Mayer, MD, [facility] See Shea Evans on call - gastroenterology for best contact person 01/19/2022 5:46 PM   HPI   Allison Mcintyre is a 61 y.o. female with a past medical history significant for   See PMH for any additional medical problems.  Allison Mcintyre is known to Dr. Hilarie Fredrickson for history of colon polyps.   Patient was seen in ED in June for abdominal pain. Liver enzymes were elevated with AST of 220 / ALT 363. They were normal in 2020. RUQ US showed fatty liver and cholelithiasis without cholecystitis. She was referred for outpatient surgical  evaluation. She was seen 12/31/21 by Dr. Zenia Mcintyre with [facility]. She had continued to have episodes of abdominal pain but repeat LFTs were nearly normal ( just mildly elevated ALT). She was scheduled for a lap cholecystectomy.   Today patient underwent laparoscopic cholecystectomy. IOC suspicious for partially obstructive choledocholithiasis in distal CBD. Her liver enzymes are only mildly elevated, alk phos and bilirubin are normal.   Allison Mcintyre endorses chronic constipation. She is unable to recall what she takes for constipation and I don't see anything on her outpatient med list.    Previous GI Evaluation    Jan 2021 surveillance colonoscopy  --normal other than hemorrhoids   Recent Labs and Imaging DG Cholangiogram Operative  Result Date: 01/19/2022 CLINICAL DATA:  Intraoperative cholangiogram EXAM: INTRAOPERATIVE CHOLANGIOGRAM TECHNIQUE: Cholangiographic images from the C-arm fluoroscopic device were submitted for interpretation post-operatively. Please see the procedural report for the amount of contrast and the fluoroscopy time utilized. FLUOROSCOPY: Refer to separate report COMPARISON:  None Available. FINDINGS: A total of 3 fluoroscopic loops are submitted for review taken during intraoperative cholangiogram. Images demonstrate surgical instrumentation and clips overlying the gallbladder fossa. Injection via the cystic duct stump opacifies both the intrahepatic and extrahepatic biliary tree. The intrahepatic biliary ducts are normal in caliber. There appear to be small filling defects within the distal common bile duct suspicious for partially obstructing choledocholithiasis given that there is passage of contrast material through  the ampulla into the small bowel. Additional injection of the biliary system on subsequent cine loops demonstrate preferential opacification of the intrahepatic biliary ducts. There is faint passage of contrast material into the common bile duct without definite passage of  contrast material through the ampulla. IMPRESSION: Intraoperative cholangiogram as described. Initial injection of the biliary system is suspicious for partially obstructive choledocholithiasis in the distal CBD. Refer to operative report for full details. These results were called by telephone at the time of interpretation on 01/19/2022 at 1000 to provider [facility] , who verbally acknowledged these results. Electronically Signed   By: Albin Felling M.D.   On: 01/19/2022 09:58    Labs:  No results for input(s): "WBC", "HGB", "HCT", "PLT" in the last 72 hours. Recent Labs    01/19/22 1030  NA 141  K 3.6  CL 109  CO2 24  GLUCOSE 215*  BUN 16  CREATININE 0.70  CALCIUM 9.0   Recent Labs    01/19/22 1030  PROT 7.1  ALBUMIN 3.6  AST 49*  ALT 54*  ALKPHOS 89  BILITOT 0.6   No results for input(s): "HEPBSAG", "HCVAB", "HEPAIGM", "HEPBIGM" in the last 72 hours. No results for input(s): "LABPROT", "INR" in the last 72 hours.  Past Medical History:  Diagnosis Date   Anemia    Bronchitis    Headache    Hemorrhoids    Hyperlipidemia    Hypertension    Menorrhagia     Past Surgical History:  Procedure Laterality Date   ABDOMINAL HYSTERECTOMY  2003   CESAREAN SECTION     COLONOSCOPY     POLYPECTOMY      Family History  Problem Relation Age of Onset   Cancer Mother    Pancreatic cancer Mother    Bronchitis Father    Colon polyps Neg Hx    Esophageal cancer Neg Hx    Stomach cancer Neg Hx    Rectal cancer Neg Hx    Colon cancer Neg Hx    Breast cancer Neg Hx     Prior to Admission medications   Medication Sig Start Date End Date Taking? Authorizing Provider  albuterol (VENTOLIN HFA) 108 (90 Base) MCG/ACT inhaler INHALE 1 TO 2 PUFFS BY MOUTH EVERY 6 HOURS AS NEEDED FOR WHEEZING OR SHORTNESS OF BREATH Patient taking differently: Inhale 1-2 puffs into the lungs every 6 (six) hours as needed for wheezing or shortness of breath. 05/05/21  Yes Rick Duff, MD   amLODipine (NORVASC) 5 MG tablet Take 5 mg by mouth daily.   Yes [provider]  budesonide-formoterol (SYMBICORT) 80-4.5 MCG/ACT inhaler Inhale 2 puffs into the lungs daily. Patient taking differently: Inhale 2 puffs into the lungs daily as needed (for flares). 04/27/21  Yes White, Adrienne R, NP  chlorthalidone (HYGROTON) 25 MG tablet Take 1 tablet (25 mg total) by mouth daily. 04/09/21 04/09/22 Yes Timothy Lasso, MD  diclofenac Sodium (VOLTAREN) 1 % GEL Apply 4 g topically 4 (four) times daily. Patient taking differently: Apply 4 g topically See admin instructions. Apply 4 grams of gel to affected areas four times a day 03/20/21  Yes Aslam, Sadia, MD  fluticasone (FLONASE) 50 MCG/ACT nasal spray Place 2 sprays into both nostrils daily. Patient taking differently: Place 2 sprays into both nostrils daily as needed for allergies or rhinitis. 02/23/21  Yes Melynda Ripple, MD  hydrochlorothiazide (HYDRODIURIL) 25 MG tablet Take 25 mg by mouth in the morning.   Yes [provider]  nitroGLYCERIN (NITRODUR -  DOSED IN MG/24 HR) 0.2 mg/hr patch Apply 1/4th patch to affected achilles, change daily Patient taking differently: Place 0.05 mg onto the skin daily as needed (as directed to affected achilles (1/4 patch)). 12/17/21  Yes Hudnall, Sharyn Lull, MD  pravastatin (PRAVACHOL) 20 MG tablet TAKE 1 TABLET (20 MG TOTAL) BY MOUTH DAILY. 01/01/22  Yes Rick Duff, MD  verapamil (CALAN-SR) 120 MG CR tablet Take 1 tablet (120 mg total) by mouth daily. 10/27/21 10/22/22 Yes Iona Beard, MD    Current Facility-Administered Medications  Medication Dose Route Frequency Provider Last Rate Last Admin   acetaminophen (TYLENOL) tablet 1,000 mg  1,000 mg Oral TID Dwan Bolt, MD       albuterol (PROVENTIL) (2.5 MG/3ML) 0.083% nebulizer solution 2.5 mg  2.5 mg Inhalation Q6H PRN Dwan Bolt, MD       [START ON 01/20/2022] amLODipine (NORVASC) tablet 5 mg  5 mg Oral Daily Dwan Bolt, MD        diphenhydrAMINE (BENADRYL) 12.5 MG/5ML elixir 12.5 mg  12.5 mg Oral Q6H PRN Dwan Bolt, MD       Or   diphenhydrAMINE (BENADRYL) injection 12.5 mg  12.5 mg Intravenous Q6H PRN Dwan Bolt, MD       docusate sodium (COLACE) capsule 100 mg  100 mg Oral BID Dwan Bolt, MD       fentaNYL (SUBLIMAZE) 50 MCG/ML injection            fentaNYL (SUBLIMAZE) 50 MCG/ML injection            fentaNYL (SUBLIMAZE) 50 MCG/ML injection            fluticasone furoate-vilanterol (BREO ELLIPTA) 100-25 MCG/ACT 1 puff  1 puff Inhalation Daily Dwan Bolt, MD       HYDROmorphone (DILAUDID) 1 MG/ML injection            HYDROmorphone (DILAUDID) injection 0.5 mg  0.5 mg Intravenous Q4H PRN Dwan Bolt, MD       lactated ringers infusion   Intravenous Continuous Dwan Bolt, MD       methocarbamol (ROBAXIN) tablet 750 mg  750 mg Oral QID Dwan Bolt, MD       ondansetron (ZOFRAN-ODT) disintegrating tablet 4 mg  4 mg Oral Q6H PRN Dwan Bolt, MD       Or   ondansetron [facility]) injection 4 mg  4 mg Intravenous Q6H PRN Dwan Bolt, MD       oxyCODONE (Oxy IR/ROXICODONE) immediate release tablet 5 mg  5 mg Oral Q4H PRN Dwan Bolt, MD       [START ON 01/20/2022] pravastatin (PRAVACHOL) tablet 20 mg  20 mg Oral Daily Dwan Bolt, MD       verapamil (CALAN-SR) CR tablet 120 mg  120 mg Oral Daily Dwan Bolt, MD        Allergies as of 01/09/2022   (No Known Allergies)    Social History   Socioeconomic History   Marital status: Single    Spouse name: Not on file   Number of children: Not on file   Years of education: Not on file   Highest education level: Not on file  Occupational History   Not on file  Tobacco Use   Smoking status: Every Day    Packs/day: 0.25    Types: Cigarettes   Smokeless tobacco: Never   Tobacco comments:    5 per day  Vaping Use  Vaping Use: Never used  Substance and Sexual Activity   Alcohol use: Yes    Alcohol/week: 3.0  standard drinks of alcohol    Types: 3 Cans of beer per week    Comment: occasional , about weekly   Drug use: No   Sexual activity: Yes    Birth control/protection: Surgical  Other Topics Concern   Not on file  Social History Narrative   Not on file   Social Determinants of Health   Financial Resource Strain: Not on file  Food Insecurity: Not on file  Transportation Needs: Not on file  Physical Activity: Not on file  Stress: Not on file  Social Connections: Not on file  Intimate Partner Violence: Not on file    Review of Systems: All systems reviewed and negative except where noted in HPI.  Physical Exam: Vital signs in last 24 hours: Temp:  [97.8 F (36.6 C)-98.2 F (36.8 C)] 98.1 F (36.7 C) (08/07 1137) Pulse Rate:  [52-76] 59 (08/07 1137) Resp:  [15-19] 16 (08/07 1137) BP: (106-168)/(75-103) 160/85 (08/07 1137) SpO2:  [88 %-99 %] 99 % (08/07 1137) Weight:  [127 kg] 127 kg (08/07 0600)    General:  Alert obese pleasant female in NAD Psych:  Cooperative. Normal mood and affect Eyes: Pupils equal, no icterus. Conjunctive pink Ears:  Normal auditory acuity Nose: No deformity, discharge or lesions Neck:  Supple, no masses felt Lungs:  Clear to auscultation.  Heart:  Regular rate, regular rhythm. No lower extremity edema Abdomen:  Soft, nondistended, nontender, active bowel sounds, no masses felt. No drainage for op sites Rectal :  Deferred Msk: Symmetrical without gross deformities.  Neurologic:  Alert, oriented, grossly normal neurologically Skin:  Intact without significant lesions.    Intake/Output from previous day: No intake/output data recorded. Intake/Output this shift:  Total I/O In: 900 [I.V.:800; IV Piggyback:100] Out: 20 [Blood:20]    Principal Problem:   Choledocholithiasis    Tye Savoy, NP-C @  01/19/2022, 12:37 PM

## 2022-01-19 NOTE — Anesthesia Postprocedure Evaluation (Signed)
Anesthesia Post Note  Patient: Allison Mcintyre [place]  Procedure(s) Performed: LAPAROSCOPIC CHOLECYSTECTOMY WITH INTRAOPERATIVE CHOLANGIOGRAM     Patient location during evaluation: PACU Anesthesia Type: General Level of consciousness: awake and alert Pain management: pain level controlled Vital Signs Assessment: post-procedure vital signs reviewed and stable Respiratory status: spontaneous breathing, nonlabored ventilation and respiratory function stable Cardiovascular status: blood pressure returned to baseline and stable Postop Assessment: no apparent nausea or vomiting Anesthetic complications: no   No notable events documented.  Last Vitals:  Vitals:   01/19/22 1100 01/19/22 1137  BP: (!) 168/87 (!) 160/85  Pulse: 62 (!) 59  Resp: 18 16  Temp:  36.7 C  SpO2: 92% 99%    Last Pain:  Vitals:   01/19/22 1137  TempSrc:   PainSc: Asleep                 Lidia Collum

## 2022-01-19 NOTE — Anesthesia Procedure Notes (Signed)
Procedure Name: Intubation Date/Time: 01/19/2022 7:40 AM  Performed by: Niel Hummer, CRNAPre-anesthesia Checklist: Patient identified, Emergency Drugs available, Suction available and Patient being monitored Patient Re-evaluated:Patient Re-evaluated prior to induction Oxygen Delivery Method: Circle system utilized Preoxygenation: Pre-oxygenation with 100% oxygen Induction Type: IV induction Ventilation: Mask ventilation without difficulty Laryngoscope Size: Mac and 4 Grade View: Grade II Tube type: Oral Tube size: 7.0 mm Number of attempts: 1 Airway Equipment and Method: Stylet Placement Confirmation: ETT inserted through vocal cords under direct vision, positive ETCO2 and breath sounds checked- equal and bilateral Secured at: 22 cm Tube secured with: Tape Dental Injury: Teeth and Oropharynx as per pre-operative assessment

## 2022-01-19 NOTE — Interval H&P Note (Signed)
History and Physical Interval Note:  01/19/2022 7:23 AM  Allison Mcintyre  has presented today for surgery, with the diagnosis of GALLSTONES.  The various methods of treatment have been discussed with the patient and family. After consideration of risks, benefits and other options for treatment, the patient has consented to  Procedure(s): LAPAROSCOPIC CHOLECYSTECTOMY (N/A) with possible cholangiogram as a surgical intervention.  The patient's history has been reviewed, patient examined, no change in status, stable for surgery.  I have reviewed the patient's chart and labs.  Questions were answered to the patient's satisfaction.     Dwan Bolt

## 2022-01-19 NOTE — Op Note (Signed)
Date: 01/19/22  Patient: Allison Mcintyre MRN: [id number]  Preoperative Diagnosis: Symptomatic cholelithiasis Postoperative Diagnosis: Symptomatic cholelithiasis, choledocholithiasis  Procedure: Laparoscopic cholecystectomy with intraoperative cholangiogram  Surgeon: [name], MD Assistant: Gwynn Burly, MD (Resident)  EBL: Minimal  Anesthesia: General endotracheal  Specimens: Gallbladder  Indications: Allison Mcintyre is a 61 yo female who presented to the ED several weeks ago with severe epigastric and RUQ abdominal pain, with a mild elevation in LFTs. RUQ US showed cholelithiasis without signs of acute cholecystitis. She was seen in follow up in the office, at which time her LFTs had normalized. After a discussion of the risks and benefits of surgery, she agreed to proceed with cholecystectomy with intraop cholangiogram.  Findings: Cholelithiasis with evidence of choledocholithiasis on cholangiogram.  Procedure details: Informed consent was obtained in the preoperative area prior to the procedure. The patient was brought to the operating room and placed on the table in the supine position. General anesthesia was induced and appropriate lines and drains were placed for intraoperative monitoring. Perioperative antibiotics were administered per SCIP guidelines. The abdomen was prepped and draped in the usual sterile fashion. A pre-procedure timeout was taken verifying patient identity, surgical site and procedure to be performed.  A small supraumbilical skin incision was made, the subcutaneous tissue was bluntly spread, and a Veress needle was inserted. Intraperitoneal placement was confirmed with the saline drop test and insufflation was started, but initial insufflation pressures were high. Thus the Veress was removed, the fascia was grasped and elevated, and opened with cautery. The peritoneum was entered bluntly and the peritoneal cavity was visualized. A 14m Hassan trocar was inserted  and the abdomen was insufflated. The peritoneal cavity was inspected with no evidence of visceral or vascular injury. Three 593mports were placed in the right subcostal margin, all under direct visualization. The fundus of the gallbladder was grasped and retracted cephalad. Adequate retraction was very difficult due to hepatic steatosis, even with the patient in steep reverse Trendelenburg. An additional 34m54mort was placed in the RLQ to help retract the colon and small bowel away off the infundibulum of the gallbladder. The infundibulum was retracted laterally. The cystic triangle was dissected out using cautery and blunt dissection, and the critical view of safety was obtained. The cystic artery was clipped and ligated. There were obvious stones within the cystic duct, and attempts were made to milk these up into the gallbladder but this was not successful as the gallbladder was distended with numerous stones at the neck. A clip was placed across the duct close to the gallbladder and a ductotomy was created sharply. Stones and sludge were then milked out of the cystic duct. A cholangiocatheter was passed through a separate stab incision in the abdominal wall, flushed, and used to cannulate the cystic duct. A cholangiogram was then performed using fluoroscopy. The common hepatic duct, left and right hepatic ducts, and common bile duct filled with contrast. There was filling of the duodenum with contrast. There were multiple filling defects consistent with stones in the distal common bile duct with mild distal ductal dilation. Glucagon was administered and the duct was flushed with saline. Repeat cholangiogram demonstrated filling of the intrahepatic ducts, but only faint opacification of the distal common duct, thus interpretation was difficult. A third cholangiogram was performed with similar findings.  The entire common bile duct was clearly in tact. The cholangiocatheter was removed and the cystic duct was  clipped proximal to the ductotomy. The cystic duct was then completely divided at  the ductotomy. The cystic duct stump closure was reinforced with a PDS endoloop. The gallbladder was taken off the liver using cautery, and the specimen was placed in an a bag. The surgical site was irrigated with saline until the effluent was clear. Hemostasis was achieved in the gallbladder fossa using cautery. There was a small liver laceration along the umbilical fissure caused by retraction, and hemostasis was achieved with cautery. The cystic duct and artery stumps were visually inspected and there was no evidence of bile leak or bleeding. The ports were removed under direct visualization and the abdomen was desufflated. The specimen was extracted via the umbilical port site. The umbilical port site fascia was closed with a 0 vicryl suture. The skin at all port sites was closed with 4-0 monocryl subcuticular suture. Dermabond was applied.  All counts were correct x2 at the end of the procedure. The patient was extubated and taken to PACU in stable condition. Gastroenterology will be consulted postoperatively for ERCP given intraop cholangiogram findings.  [name], MD 01/19/22 9:39 AM

## 2022-01-20 ENCOUNTER — Inpatient Hospital Stay (HOSPITAL_COMMUNITY): Payer: BC Managed Care – PPO

## 2022-01-20 ENCOUNTER — Observation Stay (HOSPITAL_COMMUNITY): Payer: BC Managed Care – PPO | Admitting: Registered Nurse

## 2022-01-20 ENCOUNTER — Encounter (HOSPITAL_COMMUNITY)
Admission: AD | Disposition: A | Discharge status: Home / Self Care | Payer: Self-pay | Source: Ambulatory Visit | Attending: Surgery

## 2022-01-20 ENCOUNTER — Encounter (HOSPITAL_COMMUNITY): Payer: Self-pay | Admitting: Surgery

## 2022-01-20 DIAGNOSIS — Z800 Family history of malignant neoplasm of digestive organs: Secondary | ICD-10-CM | POA: Diagnosis not present

## 2022-01-20 DIAGNOSIS — K5909 Other constipation: Secondary | ICD-10-CM | POA: Diagnosis present

## 2022-01-20 DIAGNOSIS — K8070 Calculus of gallbladder and bile duct without cholecystitis without obstruction: Secondary | ICD-10-CM | POA: Diagnosis present

## 2022-01-20 DIAGNOSIS — E785 Hyperlipidemia, unspecified: Secondary | ICD-10-CM | POA: Diagnosis present

## 2022-01-20 DIAGNOSIS — Z7951 Long term (current) use of inhaled steroids: Secondary | ICD-10-CM | POA: Diagnosis not present

## 2022-01-20 DIAGNOSIS — I10 Essential (primary) hypertension: Secondary | ICD-10-CM | POA: Diagnosis present

## 2022-01-20 DIAGNOSIS — Z90710 Acquired absence of both cervix and uterus: Secondary | ICD-10-CM | POA: Diagnosis not present

## 2022-01-20 DIAGNOSIS — F17210 Nicotine dependence, cigarettes, uncomplicated: Secondary | ICD-10-CM | POA: Diagnosis present

## 2022-01-20 DIAGNOSIS — Z6841 Body Mass Index (BMI) 40.0 and over, adult: Secondary | ICD-10-CM | POA: Diagnosis not present

## 2022-01-20 DIAGNOSIS — Z825 Family history of asthma and other chronic lower respiratory diseases: Secondary | ICD-10-CM | POA: Diagnosis not present

## 2022-01-20 DIAGNOSIS — K8050 Calculus of bile duct without cholangitis or cholecystitis without obstruction: Secondary | ICD-10-CM | POA: Diagnosis present

## 2022-01-20 DIAGNOSIS — Z79899 Other long term (current) drug therapy: Secondary | ICD-10-CM | POA: Diagnosis not present

## 2022-01-20 DIAGNOSIS — Z8249 Family history of ischemic heart disease and other diseases of the circulatory system: Secondary | ICD-10-CM | POA: Diagnosis not present

## 2022-01-20 HISTORY — PX: REMOVAL OF STONES: SHX5545

## 2022-01-20 HISTORY — PX: SPHINCTEROTOMY: SHX5279

## 2022-01-20 HISTORY — PX: ERCP: SHX5425

## 2022-01-20 LAB — COMPREHENSIVE METABOLIC PANEL
ALT: 40 U/L (ref 0–44)
AST: 33 U/L (ref 15–41)
Albumin: 3.4 g/dL — ABNORMAL LOW (ref 3.5–5.0)
Alkaline Phosphatase: 73 U/L (ref 38–126)
Anion gap: 9 (ref 5–15)
BUN: 11 mg/dL (ref 8–23)
CO2: 26 mmol/L (ref 22–32)
Calcium: 9.3 mg/dL (ref 8.9–10.3)
Chloride: 109 mmol/L (ref 98–111)
Creatinine, Ser: 0.59 mg/dL (ref 0.44–1.00)
GFR, Estimated: >60 mL/min (ref >60)
Glucose, Bld: 111 mg/dL — ABNORMAL HIGH (ref 70–99)
Potassium: 4.3 mmol/L (ref 3.5–5.1)
Sodium: 144 mmol/L (ref 135–145)
Total Bilirubin: 0.6 mg/dL (ref 0.3–1.2)
Total Protein: 6.6 g/dL (ref 6.5–8.1)

## 2022-01-20 LAB — SURGICAL PATHOLOGY

## 2022-01-20 SURGERY — ERCP, WITH INTERVENTION IF INDICATED
Anesthesia: General

## 2022-01-20 MED ORDER — SODIUM CHLORIDE 0.9 % IV SOLN
1.5000 g | INTRAVENOUS | Status: AC
  Administered 2022-01-20: 1.5 g via INTRAVENOUS
  Filled 2022-01-20 (×3): qty 4

## 2022-01-20 MED ORDER — PROPOFOL 10 MG/ML IV BOLUS
INTRAVENOUS | Status: AC
  Filled 2022-01-20: qty 20

## 2022-01-20 MED ORDER — DEXAMETHASONE SODIUM PHOSPHATE 10 MG/ML IJ SOLN
INTRAMUSCULAR | Status: DC | PRN
  Administered 2022-01-20: 10 mg via INTRAVENOUS

## 2022-01-20 MED ORDER — PROPOFOL 10 MG/ML IV BOLUS
INTRAVENOUS | Status: DC | PRN
  Administered 2022-01-20: 130 mg via INTRAVENOUS

## 2022-01-20 MED ORDER — SODIUM CHLORIDE 0.9 % IV SOLN
INTRAVENOUS | Status: DC | PRN
  Administered 2022-01-20: 80 mL

## 2022-01-20 MED ORDER — INDOMETHACIN 50 MG RE SUPP
RECTAL | Status: AC
  Filled 2022-01-20: qty 1

## 2022-01-20 MED ORDER — FENTANYL CITRATE (PF) 100 MCG/2ML IJ SOLN
INTRAMUSCULAR | Status: AC
  Filled 2022-01-20: qty 2

## 2022-01-20 MED ORDER — MIDAZOLAM HCL 2 MG/2ML IJ SOLN
INTRAMUSCULAR | Status: AC
  Filled 2022-01-20: qty 2

## 2022-01-20 MED ORDER — GLUCAGON HCL RDNA (DIAGNOSTIC) 1 MG IJ SOLR
INTRAMUSCULAR | Status: AC
  Filled 2022-01-20: qty 1

## 2022-01-20 MED ORDER — ROCURONIUM BROMIDE 10 MG/ML (PF) SYRINGE
PREFILLED_SYRINGE | INTRAVENOUS | Status: DC | PRN
  Administered 2022-01-20: 50 mg via INTRAVENOUS
  Administered 2022-01-20: 10 mg via INTRAVENOUS

## 2022-01-20 MED ORDER — MIDAZOLAM HCL 5 MG/5ML IJ SOLN
INTRAMUSCULAR | Status: DC | PRN
  Administered 2022-01-20: 2 mg via INTRAVENOUS

## 2022-01-20 MED ORDER — DICLOFENAC SUPPOSITORY 100 MG
RECTAL | Status: DC | PRN
  Administered 2022-01-20: 100 mg via RECTAL

## 2022-01-20 MED ORDER — GLUCAGON HCL RDNA (DIAGNOSTIC) 1 MG IJ SOLR
INTRAMUSCULAR | Status: DC | PRN
  Administered 2022-01-20: .5 mg via INTRAVENOUS

## 2022-01-20 MED ORDER — LIDOCAINE 2% (20 MG/ML) 5 ML SYRINGE
INTRAMUSCULAR | Status: DC | PRN
  Administered 2022-01-20: 80 mg via INTRAVENOUS

## 2022-01-20 MED ORDER — SUGAMMADEX SODIUM 500 MG/5ML IV SOLN
INTRAVENOUS | Status: DC | PRN
  Administered 2022-01-20: 400 mg via INTRAVENOUS

## 2022-01-20 MED ORDER — ONDANSETRON HCL 4 MG/2ML IJ SOLN
INTRAMUSCULAR | Status: DC | PRN
  Administered 2022-01-20: 4 mg via INTRAVENOUS

## 2022-01-20 MED ORDER — FENTANYL CITRATE (PF) 100 MCG/2ML IJ SOLN
INTRAMUSCULAR | Status: DC | PRN
  Administered 2022-01-20 (×2): 50 ug via INTRAVENOUS

## 2022-01-20 MED ORDER — INDOMETHACIN 50 MG RE SUPP: 100.0000 mg | Freq: Once | RECTAL | Status: DC

## 2022-01-20 MED ORDER — DICLOFENAC SUPPOSITORY 100 MG
RECTAL | Status: AC
  Filled 2022-01-20: qty 1

## 2022-01-20 NOTE — Progress Notes (Signed)
1 Day Post-Op  Subjective: No acute issues. Pain controlled. LFTs remain normal. Denies nausea/vomiting.  Objective: Vital signs in last 24 hours: Temp:  [97.8 F (36.6 C)-99.5 F (37.5 C)] 99 F (37.2 C) (08/08 0605) Pulse Rate:  [58-76] 67 (08/08 0605) Resp:  [15-19] 18 (08/08 0605) BP: (106-168)/(64-103) 160/78 (08/08 0605) SpO2:  [88 %-100 %] 97 % (08/08 0730)    Intake/Output from previous day: 08/07 0701 - 08/08 0700 In: 2490 [P.O.:690; I.V.:1700; IV Piggyback:100] Out: 1970 [VOJJK:0938; Blood:20] Intake/Output this shift: No intake/output data recorded.  PE: General: resting comfortably, NAD Neuro: alert and oriented, no focal deficits Resp: normal work of breathing on room air Abdomen: soft, nondistended, nontender to palpation. Incisions clean and dry with no erythema or induration. Extremities: warm and well-perfused   Lab Results:  No results for input(s): "WBC", "HGB", "HCT", "PLT" in the last 72 hours. BMET Recent Labs    01/19/22 1030 01/20/22 0345  NA 141 144  K 3.6 4.3  CL 109 109  CO2 24 26  GLUCOSE 215* 111*  BUN 16 11  CREATININE 0.70 0.59  CALCIUM 9.0 9.3   PT/INR No results for input(s): "LABPROT", "INR" in the last 72 hours. CMP     Component Value Date/Time   NA 144 01/20/2022 0345   NA 139 11/28/2021 1121   K 4.3 01/20/2022 0345   CL 109 01/20/2022 0345   CO2 26 01/20/2022 0345   GLUCOSE 111 (H) 01/20/2022 0345   BUN 11 01/20/2022 0345   BUN 15 11/28/2021 1121   CREATININE 0.59 01/20/2022 0345   CALCIUM 9.3 01/20/2022 0345   PROT 6.6 01/20/2022 0345   PROT 7.7 11/28/2021 1121   ALBUMIN 3.4 (L) 01/20/2022 0345   ALBUMIN 4.5 11/28/2021 1121   AST 33 01/20/2022 0345   ALT 40 01/20/2022 0345   ALKPHOS 73 01/20/2022 0345   BILITOT 0.6 01/20/2022 0345   BILITOT 0.7 11/28/2021 1121   GFRNONAA >60 01/20/2022 0345   GFRAA 104 12/29/2018 0949   Lipase     Component Value Date/Time   LIPASE 26 11/28/2021 1121        Studies/Results: DG Cholangiogram Operative  Result Date: 01/19/2022 CLINICAL DATA:  Intraoperative cholangiogram EXAM: INTRAOPERATIVE CHOLANGIOGRAM TECHNIQUE: Cholangiographic images from the C-arm fluoroscopic device were submitted for interpretation post-operatively. Please see the procedural report for the amount of contrast and the fluoroscopy time utilized. FLUOROSCOPY: Refer to separate report COMPARISON:  None Available. FINDINGS: A total of 3 fluoroscopic loops are submitted for review taken during intraoperative cholangiogram. Images demonstrate surgical instrumentation and clips overlying the gallbladder fossa. Injection via the cystic duct stump opacifies both the intrahepatic and extrahepatic biliary tree. The intrahepatic biliary ducts are normal in caliber. There appear to be small filling defects within the distal common bile duct suspicious for partially obstructing choledocholithiasis given that there is passage of contrast material through the ampulla into the small bowel. Additional injection of the biliary system on subsequent cine loops demonstrate preferential opacification of the intrahepatic biliary ducts. There is faint passage of contrast material into the common bile duct without definite passage of contrast material through the ampulla. IMPRESSION: Intraoperative cholangiogram as described. Initial injection of the biliary system is suspicious for partially obstructive choledocholithiasis in the distal CBD. Refer to operative report for full details. These results were called by telephone at the time of interpretation on 01/19/2022 at 1000 to provider [facility] , who verbally acknowledged these results. Electronically Signed   By: Murrell Redden  El-Abd M.D.   On: 01/19/2022 09:58    Anti-infectives: Anti-infectives (From admission, onward)    Start     Dose/Rate Route Frequency Ordered Stop   01/19/22 0600  ceFAZolin (ANCEF) IVPB 3g/100 mL premix        3 g 200 mL/hr over  30 Minutes Intravenous On call to O.R. 01/19/22 1638 01/19/22 0811        Assessment/Plan Ms. Benzel is a 61 yo female POD1 s/p lap cholecystectomy with intraop cholangiogram demonstrating choledocholithiasis. - NPO for procedure - Multimodal pain control - Home meds as appropriate - VTE: SCDs, hold chemical DVT ppx for procedure - Dispo: med-surg floor     LOS: 0 days    [name], MD [facility] General, Hepatobiliary and Pancreatic Surgery 01/20/22 7:44 AM

## 2022-01-20 NOTE — Anesthesia Preprocedure Evaluation (Addendum)
Anesthesia Evaluation  Patient identified by MRN, date of birth, ID band Patient awake    Reviewed: Allergy & Precautions, NPO status , Patient's Chart, lab work & pertinent test results  Airway Mallampati: II  TM Distance: >3 FB Neck ROM: Full    Dental  (+) Dental Advisory Given, Edentulous Upper, Edentulous Lower   Pulmonary Current Smoker and Patient abstained from smoking.,    Pulmonary exam normal breath sounds clear to auscultation       Cardiovascular hypertension, Pt. on medications Normal cardiovascular exam Rhythm:Regular Rate:Normal     Neuro/Psych  Headaches, negative psych ROS   GI/Hepatic negative GI ROS, Choledocholithiasis    Endo/Other  Morbid obesity  Renal/GU negative Renal ROS     Musculoskeletal negative musculoskeletal ROS (+)   Abdominal   Peds  Hematology  (+) Blood dyscrasia, anemia ,   Anesthesia Other Findings Day of surgery medications reviewed with the patient.  Reproductive/Obstetrics                            Anesthesia Physical Anesthesia Plan  ASA: 3  Anesthesia Plan: General   Post-op Pain Management: Minimal or no pain anticipated   Induction: Intravenous  PONV Risk Score and Plan: 2 and TIVA, Dexamethasone and Ondansetron  Airway Management Planned: Oral ETT  Additional Equipment:   Intra-op Plan:   Post-operative Plan: Extubation in OR  Informed Consent: I have reviewed the patients History and Physical, chart, labs and discussed the procedure including the risks, benefits and alternatives for the proposed anesthesia with the patient or authorized representative who has indicated his/her understanding and acceptance.     Dental advisory given  Plan Discussed with: CRNA and Anesthesiologist  Anesthesia Plan Comments:       Anesthesia Quick Evaluation

## 2022-01-20 NOTE — Op Note (Signed)
[facility] Patient Name: Allison Mcintyre Procedure Date: 01/20/2022 MRN: [id number] Attending MD: Gatha Mayer , MD Date of Birth: [birth year] CSN: [id number] Age: 61 Admit Type: Inpatient Procedure:                ERCP Indications:              Bile duct stone(s), For therapy of bile duct                            stone(s) + IOC yesterday, NL LFT's Providers:                Gatha Mayer, MD, Doristine Johns, RN, Gloris Ham, Technician Referring MD:             Blanchard Mane. Allen Medicines:                General Anesthesia, Unasyn 1.5 g IV, Diclofenac                            suppository Complications:            No immediate complications. Estimated Blood Loss:     Estimated blood loss was minimal. Procedure:                Pre-Anesthesia Assessment:                           - Prior to the procedure, a History and Physical                            was performed, and patient medications and                            allergies were reviewed. The patient's tolerance of                            previous anesthesia was also reviewed. The risks                            and benefits of the procedure and the sedation                            options and risks were discussed with the patient.                            All questions were answered, and informed consent                            was obtained. Prior Anticoagulants: The patient has                            taken no previous anticoagulant or antiplatelet  agents. ASA Grade Assessment: III - A patient with                            severe systemic disease. After reviewing the risks                            and benefits, the patient was deemed in                            satisfactory condition to undergo the procedure.                           After obtaining informed consent, the scope was                            passed under  direct vision. Throughout the                            procedure, the patient's blood pressure, pulse, and                            oxygen saturations were monitored continuously. The                            Duodenoscope was introduced through the mouth, and                            used to inject contrast into and used to cannulate                            the bile duct. The ERCP was accomplished without                            difficulty. The patient tolerated the procedure                            well. Scope In: Scope Out: Findings:      A scout film of the abdomen was obtained. Surgical clips, consistent       with a previous cholecystectomy, were seen in the area of the right       upper quadrant of the abdomen. Esophagus not seen well. Stomach was       normal. duodenum was normal and major + minor papilla in usual locations       w/o abnormality. Deep cannulation of CBD using wire through       sphincterotome. Contrast injected - 12 mm CBD and CHD w/ normal       intrahepatics. No obvious stones at first then in distal duct could see       the small stones seen on IOC yesterday. No leaks. Biliary sphincterotomy       performed - 12-15 mm balloon used and swept with a few stone fragments       out. Then extended sphincterotomy some and 9-12 mm balloon used and       multiple sweeps produced multiple fragments of stones  and believe duct       was cleared. No attempt to enter pancreatic duct. Impression:               - Choledocholithiasis was found. Complete removal                            was accomplished by biliary sphincterotomy and                            balloon extraction.                           - The entire main bile duct was moderately dilated,                            acquired. Moderate Sedation:      Not Applicable - Patient had care per Anesthesia. Recommendation:           - return to floor                           clear liquids only today  and solid food tomorrow if                            ok                           f/u labs in AM - may see a bump in LFT's w/                            manipulation of bile duct today Procedure Code(s):        --- Professional ---                           [phone], Endoscopic retrograde                            cholangiopancreatography (ERCP); with removal of                            calculi/debris from biliary/pancreatic duct(s)                           43262, Endoscopic retrograde                            cholangiopancreatography (ERCP); with                            sphincterotomy/papillotomy Diagnosis Code(s):        --- Professional ---                           K80.50, Calculus of bile duct without cholangitis                            or cholecystitis without obstruction  K83.8, Other specified diseases of biliary tract CPT copyright 2019 American Medical Association. All rights reserved. The codes documented in this report are preliminary and upon coder review may  be revised to meet current compliance requirements. Gatha Mayer, MD 01/20/2022 1:53:19 PM This report has been signed electronically. Number of Addenda: 0

## 2022-01-20 NOTE — Transfer of Care (Signed)
Immediate Anesthesia Transfer of Care Note  Patient: Allison Mcintyre  Procedure(s) Performed: ENDOSCOPIC RETROGRADE CHOLANGIOPANCREATOGRAPHY (ERCP) SPHINCTEROTOMY REMOVAL OF STONES  Patient Location: PACU  Anesthesia Type:General  Level of Consciousness: awake, alert , oriented and patient cooperative  Airway & Oxygen Therapy: Patient Spontanous Breathing and Patient connected to face mask oxygen  Post-op Assessment: Report given to RN, Post -op Vital signs reviewed and stable and Patient moving all extremities  Post vital signs: Reviewed and stable  Last Vitals:  Vitals Value Taken Time  BP    Temp    Pulse    Resp    SpO2      Last Pain:  Vitals:   01/20/22 1230  TempSrc: Temporal  PainSc: 7       Patients Stated Pain Goal: 2 (00/92/33 0076)  Complications: No notable events documented.

## 2022-01-20 NOTE — Anesthesia Procedure Notes (Signed)
Procedure Name: Intubation Date/Time: 01/20/2022 12:47 PM  Performed by: Victoriano Lain, CRNAPre-anesthesia Checklist: Patient identified, Emergency Drugs available, Suction available, Patient being monitored and Timeout performed Patient Re-evaluated:Patient Re-evaluated prior to induction Oxygen Delivery Method: Circle system utilized Preoxygenation: Pre-oxygenation with 100% oxygen Induction Type: IV induction Ventilation: Mask ventilation without difficulty Laryngoscope Size: Mac and 4 Grade View: Grade I Tube type: Oral Tube size: 7.5 mm Number of attempts: 1 Airway Equipment and Method: Stylet Placement Confirmation: ETT inserted through vocal cords under direct vision, positive ETCO2 and breath sounds checked- equal and bilateral Secured at: 22 cm Tube secured with: Tape Dental Injury: Teeth and Oropharynx as per pre-operative assessment

## 2022-01-20 NOTE — Interval H&P Note (Signed)
History and Physical Interval Note:  01/20/2022 12:29 PM  Allison Mcintyre  has presented today for surgery, with the diagnosis of choledocholithiasis.  The various methods of treatment have been discussed with the patient and family. After consideration of risks, benefits and other options for treatment, the patient has consented to  Procedure(s): ENDOSCOPIC RETROGRADE CHOLANGIOPANCREATOGRAPHY (ERCP) (N/A) as a surgical intervention.  The patient's history has been reviewed, patient examined, no change in status, stable for surgery.  I have reviewed the patient's chart and labs.  Questions were answered to the patient's satisfaction.     Silvano Rusk

## 2022-01-21 ENCOUNTER — Encounter (HOSPITAL_COMMUNITY): Payer: Self-pay | Admitting: Internal Medicine

## 2022-01-21 LAB — COMPREHENSIVE METABOLIC PANEL
ALT: 34 U/L (ref 0–44)
AST: 22 U/L (ref 15–41)
Albumin: 3.2 g/dL — ABNORMAL LOW (ref 3.5–5.0)
Alkaline Phosphatase: 71 U/L (ref 38–126)
Anion gap: 6 (ref 5–15)
BUN: 12 mg/dL (ref 8–23)
CO2: 27 mmol/L (ref 22–32)
Calcium: 9.2 mg/dL (ref 8.9–10.3)
Chloride: 110 mmol/L (ref 98–111)
Creatinine, Ser: 0.59 mg/dL (ref 0.44–1.00)
GFR, Estimated: >60 mL/min (ref >60)
Glucose, Bld: 123 mg/dL — ABNORMAL HIGH (ref 70–99)
Potassium: 4.3 mmol/L (ref 3.5–5.1)
Sodium: 143 mmol/L (ref 135–145)
Total Bilirubin: 0.6 mg/dL (ref 0.3–1.2)
Total Protein: 6.4 g/dL — ABNORMAL LOW (ref 6.5–8.1)

## 2022-01-21 LAB — CBC
HCT: 34 % — ABNORMAL LOW (ref 36.0–46.0)
Hemoglobin: 10.7 g/dL — ABNORMAL LOW (ref 12.0–15.0)
MCH: 31.5 pg (ref 26.0–34.0)
MCHC: 31.5 g/dL (ref 30.0–36.0)
MCV: 100 fL (ref 80.0–100.0)
Platelets: 299 10*3/uL (ref 150–400)
RBC: 3.4 MIL/uL — ABNORMAL LOW (ref 3.87–5.11)
RDW: 13.2 % (ref 11.5–15.5)
WBC: 8 10*3/uL (ref 4.0–10.5)
nRBC: 0 % (ref 0.0–0.2)

## 2022-01-21 MED ORDER — POLYETHYLENE GLYCOL 3350 17 G PO PACK
17.0000 g | PACK | Freq: Every day | ORAL | Status: DC
  Administered 2022-01-21: 17 g via ORAL
  Filled 2022-01-21: qty 1

## 2022-01-21 MED ORDER — METHOCARBAMOL 750 MG PO TABS: 750.0000 mg | ORAL_TABLET | Freq: Three times a day (TID) | ORAL | 0 refills | Status: DC | PRN

## 2022-01-21 MED ORDER — DOCUSATE SODIUM 100 MG PO CAPS: 100.0000 mg | ORAL_CAPSULE | Freq: Two times a day (BID) | ORAL | 0 refills | Status: DC

## 2022-01-21 MED ORDER — OXYCODONE HCL 5 MG PO TABS: 5.0000 mg | ORAL_TABLET | Freq: Four times a day (QID) | ORAL | 0 refills | Status: AC | PRN

## 2022-01-21 MED ORDER — ALUM & MAG HYDROXIDE-SIMETH 200-200-20 MG/5ML PO SUSP
30.0000 mL | Freq: Once | ORAL | Status: AC
Start: 2022-01-21 — End: 2022-01-21
  Administered 2022-01-21: 30 mL via ORAL
  Filled 2022-01-21: qty 30

## 2022-01-21 MED ORDER — ACETAMINOPHEN 500 MG PO TABS
1000.0000 mg | ORAL_TABLET | Freq: Three times a day (TID) | ORAL | 0 refills | Status: DC | PRN
Start: 2022-01-21 — End: 2022-03-19

## 2022-01-21 MED ORDER — CHLORTHALIDONE 25 MG PO TABS
25.0000 mg | ORAL_TABLET | Freq: Every day | ORAL | Status: DC
  Administered 2022-01-21: 25 mg via ORAL
  Filled 2022-01-21: qty 1

## 2022-01-21 NOTE — Discharge Instructions (Addendum)
[facility] [place] [facility] DISCHARGE INSTRUCTIONS  Activity No heavy lifting greater than 15 pounds for 4 weeks after surgery. Ok to shower in 24 hours, but do not bathe or submerge incisions underwater. Do not drive while taking narcotic pain medication.  Wound Care Your incisions are covered with skin glue called Dermabond. This will peel off on its own over time. You may shower and allow warm soapy water to run over your incisions. Gently pat dry. Do not submerge your incision underwater. Monitor your incision for any new redness, tenderness, or drainage.  When to Call us: Fever greater than 100.5 New redness, drainage, or swelling at incision site Severe pain, nausea, or vomiting Jaundice (yellowing of the whites of the eyes or skin)  Follow-up You have an appointment scheduled with Dr. Zenia Resides on February 04, 2022 at 3:00pm. This will be at the [facility] office at [address], [place], [place], [place]. Please arrive at least 15 minutes prior to your scheduled appointment time.  For questions or concerns, please call the office at [phone].

## 2022-01-21 NOTE — Anesthesia Postprocedure Evaluation (Signed)
Anesthesia Post Note  Patient: Allison Mcintyre  Procedure(s) Performed: ENDOSCOPIC RETROGRADE CHOLANGIOPANCREATOGRAPHY (ERCP) SPHINCTEROTOMY REMOVAL OF STONES     Patient location during evaluation: Endoscopy Anesthesia Type: General Level of consciousness: awake and alert Pain management: pain level controlled Vital Signs Assessment: post-procedure vital signs reviewed and stable Respiratory status: spontaneous breathing, nonlabored ventilation, respiratory function stable and patient connected to nasal cannula oxygen Cardiovascular status: blood pressure returned to baseline and stable Postop Assessment: no apparent nausea or vomiting Anesthetic complications: no   No notable events documented.  Last Vitals:  Vitals:   01/21/22 0543 01/21/22 0740  BP: 135/76   Pulse: 62   Resp: 20   Temp: 37.1 C   SpO2: 92% 94%    Last Pain:  Vitals:   01/21/22 0748  TempSrc:   PainSc: [place]

## 2022-01-21 NOTE — Progress Notes (Signed)
Daily Progress Note  Hospital Day: 3  Chief Complaint:  bile duct stones  Brief History Allison Mcintyre is a 61 y.o. female with a pmh not limited to HTN, cholelithiasis / choledocholithiasis, obesity,  chronic constipation.   Assessment / Plan   # 61 yo female with symptomatic cholelithiasis / choledocholithiasis. She is s/p laparoscopic cholecystectomy followed by ERCP with sphincterotomy and stone extraction this admission.  Normal liver chemistries today.  Not having any abdominal pain   # Chest discomfort. Says it feels like heartburn. She gets this discomfort sometimes as home.  Give dose of mylanta now.   # Chronic constipation. Apparently takes something at home for it but not listed on home med list.  No BM since admission.  Daily Miralax was added today.     Subjective   Having chest discomfort right now, feels like heartburn. No abdominal pain. Hasn't had a BM since admission but not unusual as she has chronic constipation   Objective   Endoscopic studies:  ERCP - Choledocholithiasis was found. Complete removal was accomplished by biliary sphincterotomy and balloon extraction. - The entire main bile duct was moderately dilated, acquired.  Imaging:  DG ERCP  Result Date: 01/20/2022 CLINICAL DATA:  61 year old female with a history of choledocholithiasis EXAM: ERCP TECHNIQUE: Multiple spot images obtained with the fluoroscopic device and submitted for interpretation post-procedure. FLUOROSCOPY: Radiation Exposure Index (as provided by the fluoroscopic device): 204 mGy Kerma COMPARISON:  01/19/2022 FINDINGS: Limited intraoperative fluoroscopic spot images during ERCP. Initial image demonstrates endoscope projecting over the upper abdomen. Subsequent images demonstrates partial opacification of the intrahepatic and extrahepatic biliary ducts with deployment of a retrieval balloon. Surgical changes of cholecystectomy. IMPRESSION: Limited images during ERCP  demonstrates treatment of choledocholithiasis with deployment of a retrieval balloon. Please refer to the dictated operative report for full details of intraoperative findings and procedure. Electronically Signed   By: Corrie Mckusick D.O.   On: 01/20/2022 14:16   DG Cholangiogram Operative  Result Date: 01/19/2022 CLINICAL DATA:  Intraoperative cholangiogram EXAM: INTRAOPERATIVE CHOLANGIOGRAM TECHNIQUE: Cholangiographic images from the C-arm fluoroscopic device were submitted for interpretation post-operatively. Please see the procedural report for the amount of contrast and the fluoroscopy time utilized. FLUOROSCOPY: Refer to separate report COMPARISON:  None Available. FINDINGS: A total of 3 fluoroscopic loops are submitted for review taken during intraoperative cholangiogram. Images demonstrate surgical instrumentation and clips overlying the gallbladder fossa. Injection via the cystic duct stump opacifies both the intrahepatic and extrahepatic biliary tree. The intrahepatic biliary ducts are normal in caliber. There appear to be small filling defects within the distal common bile duct suspicious for partially obstructing choledocholithiasis given that there is passage of contrast material through the ampulla into the small bowel. Additional injection of the biliary system on subsequent cine loops demonstrate preferential opacification of the intrahepatic biliary ducts. There is faint passage of contrast material into the common bile duct without definite passage of contrast material through the ampulla. IMPRESSION: Intraoperative cholangiogram as described. Initial injection of the biliary system is suspicious for partially obstructive choledocholithiasis in the distal CBD. Refer to operative report for full details. These results were called by telephone at the time of interpretation on 01/19/2022 at 1000 to provider [facility] , who verbally acknowledged these results. Electronically Signed   By: Albin Felling M.D.   On: 01/19/2022 09:58    Lab Results: Recent Labs    01/21/22 0426  WBC 8.0  HGB 10.7*  HCT 34.0*  PLT 299   BMET Recent Labs    01/19/22 1030 01/20/22 0345 01/21/22 0426  NA 141 144 143  K 3.6 4.3 4.3  CL 109 109 110  CO2 '24 26 27  '$ GLUCOSE 215* 111* 123*  BUN '16 11 12  '$ CREATININE 0.70 0.59 0.59  CALCIUM 9.0 9.3 9.2   LFT Recent Labs    01/21/22 0426  PROT 6.4*  ALBUMIN 3.2*  AST 22  ALT 34  ALKPHOS 71  BILITOT 0.6   PT/INR No results for input(s): "LABPROT", "INR" in the last 72 hours.   Scheduled inpatient medications:   acetaminophen  1,000 mg Oral TID   amLODipine  5 mg Oral Daily   chlorthalidone  25 mg Oral Daily   docusate sodium  100 mg Oral BID   fluticasone furoate-vilanterol  1 puff Inhalation Daily   indomethacin  100 mg Rectal Once   methocarbamol  750 mg Oral QID   polyethylene glycol  17 g Oral Daily   pravastatin  20 mg Oral Daily   verapamil  120 mg Oral Daily   Continuous inpatient infusions:  PRN inpatient medications: albuterol, diphenhydrAMINE **OR** diphenhydrAMINE, ondansetron **OR** ondansetron (ZOFRAN) IV, oxyCODONE  Vital signs in last 24 hours: Temp:  [97.9 F (36.6 C)-99 F (37.2 C)] 98.7 F (37.1 C) (08/09 0543) Pulse Rate:  [57-81] 62 (08/09 0543) Resp:  [14-25] 20 (08/09 0543) BP: (130-178)/(50-118) 135/76 (08/09 0543) SpO2:  [91 %-97 %] 94 % (08/09 0740)    Intake/Output Summary (Last 24 hours) at 01/21/2022 1107 Last data filed at 01/21/2022 0600 Gross per 24 hour  Intake 2592.15 ml  Output 1200 ml  Net 1392.15 ml     Physical Exam:  General: Alert pleasant obese female in NAD Heart:  Regular rate and rhythm. No lower extremity edema Pulmonary: Normal respiratory effort Abdomen: Soft, nondistended, nontender. Normal bowel sounds.  Neurologic: Alert and oriented Psych: Pleasant. Cooperative.    Intake/Output from previous day: 08/08 0701 - 08/09 0700 In: 2892.2 [P.O.:720; I.V.:2072.2; IV  Piggyback:100] Out: 1500 [Urine:1500] Intake/Output this shift: No intake/output data recorded.    Principal Problem:   Choledocholithiasis     LOS: 1 day   Tye Savoy ,NP 01/21/2022, 11:07 AM

## 2022-01-21 NOTE — Progress Notes (Signed)
  Transition of Care [facility]) Screening Note   Patient Details  Name: AURA BIBBY Date of Birth: 11-08-1960   Transition of Care [facility]) CM/SW Contact:    Lennart Pall, LCSW Phone Number: 01/21/2022, 2:09 PM    Transition of Care Department [facility]) has reviewed patient and no TOC needs have been identified at this time. We will continue to monitor patient advancement through interdisciplinary progression rounds. If new patient transition needs arise, please place a TOC consult.

## 2022-01-21 NOTE — Progress Notes (Signed)
1 Day Post-Op  Subjective: No acute issues. No pain after ERCP. Tolerating clear liquids.  Objective: Vital signs in last 24 hours: Temp:  [97.9 F (36.6 C)-99.1 F (37.3 C)] 98.7 F (37.1 C) (08/09 0543) Pulse Rate:  [57-81] 62 (08/09 0543) Resp:  [14-25] 20 (08/09 0543) BP: (120-178)/(50-118) 135/76 (08/09 0543) SpO2:  [88 %-97 %] 94 % (08/09 0740)    Intake/Output from previous day: 08/08 0701 - 08/09 0700 In: 2892.2 [P.O.:720; I.V.:2072.2; IV Piggyback:100] Out: 1500 [Urine:1500] Intake/Output this shift: No intake/output data recorded.  PE: General: resting comfortably, NAD Neuro: alert and oriented, no focal deficits Resp: normal work of breathing on room air Abdomen: soft, nondistended, nontender to palpation. Incisions clean and dry with no erythema or induration. Extremities: warm and well-perfused   Lab Results:  Recent Labs    01/21/22 0426  WBC 8.0  HGB 10.7*  HCT 34.0*  PLT 299   BMET Recent Labs    01/20/22 0345 01/21/22 0426  NA 144 143  K 4.3 4.3  CL 109 110  CO2 26 27  GLUCOSE 111* 123*  BUN 11 12  CREATININE 0.59 0.59  CALCIUM 9.3 9.2   PT/INR No results for input(s): "LABPROT", "INR" in the last 72 hours. CMP     Component Value Date/Time   NA 143 01/21/2022 0426   NA 139 11/28/2021 1121   K 4.3 01/21/2022 0426   CL 110 01/21/2022 0426   CO2 27 01/21/2022 0426   GLUCOSE 123 (H) 01/21/2022 0426   BUN 12 01/21/2022 0426   BUN 15 11/28/2021 1121   CREATININE 0.59 01/21/2022 0426   CALCIUM 9.2 01/21/2022 0426   PROT 6.4 (L) 01/21/2022 0426   PROT 7.7 11/28/2021 1121   ALBUMIN 3.2 (L) 01/21/2022 0426   ALBUMIN 4.5 11/28/2021 1121   AST 22 01/21/2022 0426   ALT 34 01/21/2022 0426   ALKPHOS 71 01/21/2022 0426   BILITOT 0.6 01/21/2022 0426   BILITOT 0.7 11/28/2021 1121   GFRNONAA >60 01/21/2022 0426   GFRAA 104 12/29/2018 0949   Lipase     Component Value Date/Time   LIPASE 26 11/28/2021 1121        Studies/Results: DG ERCP  Result Date: 01/20/2022 CLINICAL DATA:  61 year old female with a history of choledocholithiasis EXAM: ERCP TECHNIQUE: Multiple spot images obtained with the fluoroscopic device and submitted for interpretation post-procedure. FLUOROSCOPY: Radiation Exposure Index (as provided by the fluoroscopic device): 204 mGy Kerma COMPARISON:  01/19/2022 FINDINGS: Limited intraoperative fluoroscopic spot images during ERCP. Initial image demonstrates endoscope projecting over the upper abdomen. Subsequent images demonstrates partial opacification of the intrahepatic and extrahepatic biliary ducts with deployment of a retrieval balloon. Surgical changes of cholecystectomy. IMPRESSION: Limited images during ERCP demonstrates treatment of choledocholithiasis with deployment of a retrieval balloon. Please refer to the dictated operative report for full details of intraoperative findings and procedure. Electronically Signed   By: Corrie Mckusick D.O.   On: 01/20/2022 14:16   DG Cholangiogram Operative  Result Date: 01/19/2022 CLINICAL DATA:  Intraoperative cholangiogram EXAM: INTRAOPERATIVE CHOLANGIOGRAM TECHNIQUE: Cholangiographic images from the C-arm fluoroscopic device were submitted for interpretation post-operatively. Please see the procedural report for the amount of contrast and the fluoroscopy time utilized. FLUOROSCOPY: Refer to separate report COMPARISON:  None Available. FINDINGS: A total of 3 fluoroscopic loops are submitted for review taken during intraoperative cholangiogram. Images demonstrate surgical instrumentation and clips overlying the gallbladder fossa. Injection via the cystic duct stump opacifies both the intrahepatic and extrahepatic  biliary tree. The intrahepatic biliary ducts are normal in caliber. There appear to be small filling defects within the distal common bile duct suspicious for partially obstructing choledocholithiasis given that there is passage of  contrast material through the ampulla into the small bowel. Additional injection of the biliary system on subsequent cine loops demonstrate preferential opacification of the intrahepatic biliary ducts. There is faint passage of contrast material into the common bile duct without definite passage of contrast material through the ampulla. IMPRESSION: Intraoperative cholangiogram as described. Initial injection of the biliary system is suspicious for partially obstructive choledocholithiasis in the distal CBD. Refer to operative report for full details. These results were called by telephone at the time of interpretation on 01/19/2022 at 1000 to provider [facility] , who verbally acknowledged these results. Electronically Signed   By: Allison Mcintyre M.D.   On: 01/19/2022 09:58    Anti-infectives: Anti-infectives (From admission, onward)    Start     Dose/Rate Route Frequency Ordered Stop   01/20/22 1221  ampicillin-sulbactam (UNASYN) 1.5 g in sodium chloride 0.9 % 100 mL IVPB        1.5 g 200 mL/hr over 30 Minutes Intravenous 60 min pre-op 01/20/22 1221 01/20/22 1353   01/19/22 0600  ceFAZolin (ANCEF) IVPB 3g/100 mL premix        3 g 200 mL/hr over 30 Minutes Intravenous On call to O.R. 01/19/22 3875 01/19/22 0811        Assessment/Plan [name] is a 61 yo female POD2 s/p lap cholecystectomy with intraop cholangiogram demonstrating choledocholithiasis. - ERCP completed yesterday with sphincterotomy and stone extraction. - No pain today, advance to regular diet - Multimodal pain control - Home meds as appropriate - VTE: SCDs - Dispo: med-surg floor. Discharge home likely this afternoon if patient is tolerating a regular diet. She is working on finding a ride home.    LOS: 1 day    [name], MD [facility] Surgery General, Hepatobiliary and Pancreatic Surgery 01/21/22 8:33 AM

## 2022-01-22 ENCOUNTER — Ambulatory Visit: Payer: BC Managed Care – PPO

## 2022-01-29 NOTE — Discharge Summary (Signed)
Physician Discharge Summary   Patient ID: Allison Mcintyre [id number] 61 y.o. 05-28-1961  Admit date: 01/19/2022  Discharge date and time: 01/21/2022  3:05 PM   Admitting Physician: Dwan Bolt, MD   Discharge Physician: [name], MD  Admission Diagnoses: Symptomatic cholelithiasis  Discharge Diagnoses: Choledocholithiasis  Admission Condition: good  Discharged Condition: good  Indication for Admission: Allison Mcintyre is a 61 yo female who presented with an episode of severe epigastric pain associated with mildly elevated LFTs. RUQ US showed cholelithiasis, and she was referred for an elective cholecystectomy. She presented for surgery on 01/19/22.  Hospital Course: The patient was taken to the OR on 01/19/22 for a laparoscopic cholecystectomy with intraoperative cholangiogram. Cholangiogram showed choledocholithiasis. Please see separately dictated operative note for further details of this portion of the procedure. The patient was admitted postoperatively to the med-surg floor in stable condition, and GI was consulted for ERCP. She was taken for an ERCP with sphincterotomy and stone extraction by Dr. Carlean Purl on 8/8. She was advanced to a clear liquid diet following the procedure, and a regular diet the next morning, which she tolerated without difficulty. Her LFTs remained normal. She was examined the morning of 8/9 and her pain was controlled and she was tolerating oral intake, with stable vital signs. She was deemed appropriate for discharge home.  Consults: GI  Significant Diagnostic Studies: Intraop cholangiogram  Treatments: procedures: ERCP and surgery: Laparoscopic cholecystectomy with intraoperative cholangiogram  Discharge Exam: General: resting comfortably, NAD Neuro: alert and oriented, no focal deficits Resp: normal work of breathing on room air Abdomen: soft, nondistended, nontender to palpation. Incisions clean and dry with no erythema or induration. Extremities:  warm and well-perfused   Disposition: Discharge disposition: 01-Home or Self Care       Patient Instructions:  Allergies as of 01/21/2022   No Known Allergies      Medication List     TAKE these medications    acetaminophen 500 MG tablet Commonly known as: TYLENOL Take 2 tablets (1,000 mg total) by mouth every 8 (eight) hours as needed for mild pain.   albuterol 108 (90 Base) MCG/ACT inhaler Commonly known as: VENTOLIN HFA INHALE 1 TO 2 PUFFS BY MOUTH EVERY 6 HOURS AS NEEDED FOR WHEEZING OR SHORTNESS OF BREATH What changed:  how much to take how to take this when to take this reasons to take this additional instructions   amLODipine 5 MG tablet Commonly known as: NORVASC Take 5 mg by mouth daily.   budesonide-formoterol 80-4.5 MCG/ACT inhaler Commonly known as: Symbicort Inhale 2 puffs into the lungs daily. What changed:  when to take this reasons to take this   chlorthalidone 25 MG tablet Commonly known as: HYGROTON Take 1 tablet (25 mg total) by mouth daily.   diclofenac Sodium 1 % Gel Commonly known as: Voltaren Apply 4 g topically 4 (four) times daily. What changed:  when to take this additional instructions   docusate sodium 100 MG capsule Commonly known as: COLACE Take 1 capsule (100 mg total) by mouth 2 (two) times daily.   fluticasone 50 MCG/ACT nasal spray Commonly known as: FLONASE Place 2 sprays into both nostrils daily. What changed:  when to take this reasons to take this   hydrochlorothiazide 25 MG tablet Commonly known as: HYDRODIURIL Take 25 mg by mouth in the morning.   methocarbamol 750 MG tablet Commonly known as: ROBAXIN Take 1 tablet (750 mg total) by mouth every 8 (eight) hours as needed for muscle spasms.  nitroGLYCERIN 0.2 mg/hr patch Commonly known as: NITRODUR - Dosed in mg/24 hr Apply 1/4th patch to affected achilles, change daily What changed:  how much to take how to take this when to take this reasons to  take this additional instructions   pravastatin 20 MG tablet Commonly known as: PRAVACHOL TAKE 1 TABLET (20 MG TOTAL) BY MOUTH DAILY.   verapamil 120 MG CR tablet Commonly known as: CALAN-SR Take 1 tablet (120 mg total) by mouth daily.       ASK your doctor about these medications    oxyCODONE 5 MG immediate release tablet Commonly known as: Oxy IR/ROXICODONE Take 1 tablet (5 mg total) by mouth every 6 (six) hours as needed for up to 5 days for severe pain. Ask about: Should I take this medication?       Activity: no heavy lifting for 4 weeks Diet: regular diet Wound Care: keep wound clean and dry  Follow-up with Dr. Zenia Resides on 02/04/22.  Signed: Dwan Bolt 01/29/2022 7:30 AM

## 2022-02-05 ENCOUNTER — Telehealth: Payer: Self-pay | Admitting: *Deleted

## 2022-02-05 NOTE — Telephone Encounter (Signed)
Called pt to schedule an appt per Dr Cain Sieve. Stated she had her gallbladder removed  on 8/7 and is doing good. Appt schedule with Dr Pilger Rolls on Monday 8/28.

## 2022-02-05 NOTE — Telephone Encounter (Signed)
Call transferred from front office, stating pt stated she had surgery and is crying. When I answered the call, pt stated she's ok and does not need to talk to anyone and hangs up the phone. Called pt back - Difficulty to understanding pt, speech somewhat slurred and she's talking fast - stated she's on FMLA, she had surgery and not used to to being at home. Asked if she needs to speak to someone about what's going on, stated they said this would happened and she's ok,she will not harm herself "I'm not suicidal". Advised pt to call back if she needs to speak to someone.

## 2022-02-08 NOTE — Progress Notes (Deleted)
   CC: Depressed mood/follow up after cholecystectomy  HPI:  [name] is a 61 y.o. with medical history of HTN, HLD, chronic anemia, chololithiasis s/p cholecystectomy presenting to [facility] for depressed mood and follow after cholecystectomy.   Please see problem-based list for further details, assessments, and plans.  Past Medical History:  Diagnosis Date   Anemia    Bronchitis    Headache    Hemorrhoids    Hyperlipidemia    Hypertension    Menorrhagia      Current Outpatient Medications (Cardiovascular):    amLODipine (NORVASC) 5 MG tablet, Take 5 mg by mouth daily.   chlorthalidone (HYGROTON) 25 MG tablet, Take 1 tablet (25 mg total) by mouth daily.   hydrochlorothiazide (HYDRODIURIL) 25 MG tablet, Take 25 mg by mouth in the morning.   nitroGLYCERIN (NITRODUR - DOSED IN MG/24 HR) 0.2 mg/hr patch, Apply 1/4th patch to affected achilles, change daily (Patient taking differently: Place 0.05 mg onto the skin daily as needed (as directed to affected achilles (1/4 patch)).)   pravastatin (PRAVACHOL) 20 MG tablet, TAKE 1 TABLET (20 MG TOTAL) BY MOUTH DAILY.   verapamil (CALAN-SR) 120 MG CR tablet, Take 1 tablet (120 mg total) by mouth daily.  Current Outpatient Medications (Respiratory):    albuterol (VENTOLIN HFA) 108 (90 Base) MCG/ACT inhaler, INHALE 1 TO 2 PUFFS BY MOUTH EVERY 6 HOURS AS NEEDED FOR WHEEZING OR SHORTNESS OF BREATH (Patient taking differently: Inhale 1-2 puffs into the lungs every 6 (six) hours as needed for wheezing or shortness of breath.)   budesonide-formoterol (SYMBICORT) 80-4.5 MCG/ACT inhaler, Inhale 2 puffs into the lungs daily. (Patient taking differently: Inhale 2 puffs into the lungs daily as needed (for flares).)   fluticasone (FLONASE) 50 MCG/ACT nasal spray, Place 2 sprays into both nostrils daily. (Patient taking differently: Place 2 sprays into both nostrils daily as needed for allergies or rhinitis.)  Current Outpatient Medications  (Analgesics):    acetaminophen (TYLENOL) 500 MG tablet, Take 2 tablets (1,000 mg total) by mouth every 8 (eight) hours as needed for mild pain.   Current Outpatient Medications (Other):    diclofenac Sodium (VOLTAREN) 1 % GEL, Apply 4 g topically 4 (four) times daily. (Patient taking differently: Apply 4 g topically See admin instructions. Apply 4 grams of gel to affected areas four times a day)   docusate sodium (COLACE) 100 MG capsule, Take 1 capsule (100 mg total) by mouth 2 (two) times daily.   methocarbamol (ROBAXIN) 750 MG tablet, Take 1 tablet (750 mg total) by mouth every 8 (eight) hours as needed for muscle spasms.  Review of Systems:  Review of system negative unless stated in the problem list or HPI.    Physical Exam:  There were no vitals filed for this visit.  Physical Exam General: NAD HENT: NCAT Lungs: CTAB, no wheeze, rhonchi or rales.  Cardiovascular: Normal heart sounds, no r/m/g, 2+ pulses in all extremities. No LE edema Abdomen: No TTP, normal bowel sounds MSK: No asymmetry or muscle atrophy.  Skin: no lesions noted on exposed skin Neuro: Alert and oriented x4. CN grossly intact Psych: Normal mood and normal affect   Assessment & Plan:   No problem-specific Assessment & Plan notes found for this encounter.   See Encounters Tab for problem based charting.  Patient discussed with Dr. {NAMES:[id number]::"Guilloud","Hoffman","Mullen","Narendra","Vincent","Machen","Lau","Hatcher"} Allison Schuller, MD Tillie Rung. [facility] Internal Medicine Residency, PGY-2   Depressed Mood

## 2022-02-09 ENCOUNTER — Encounter: Payer: BC Managed Care – PPO | Admitting: Internal Medicine

## 2022-02-26 ENCOUNTER — Encounter: Payer: Self-pay | Admitting: Internal Medicine

## 2022-02-26 ENCOUNTER — Ambulatory Visit: Payer: BC Managed Care – PPO | Admitting: Internal Medicine

## 2022-02-26 ENCOUNTER — Other Ambulatory Visit: Payer: Self-pay

## 2022-02-26 VITALS — BP 155/81 | HR 66 | Temp 98.7°F | Ht 66.0 in | Wt 274.1 lb

## 2022-02-26 DIAGNOSIS — Z23 Encounter for immunization: Secondary | ICD-10-CM | POA: Diagnosis not present

## 2022-02-26 DIAGNOSIS — Z716 Tobacco abuse counseling: Secondary | ICD-10-CM

## 2022-02-26 DIAGNOSIS — K219 Gastro-esophageal reflux disease without esophagitis: Secondary | ICD-10-CM | POA: Diagnosis not present

## 2022-02-26 DIAGNOSIS — I10 Essential (primary) hypertension: Secondary | ICD-10-CM | POA: Diagnosis not present

## 2022-02-26 DIAGNOSIS — Z0000 Encounter for general adult medical examination without abnormal findings: Secondary | ICD-10-CM

## 2022-02-26 DIAGNOSIS — J449 Chronic obstructive pulmonary disease, unspecified: Secondary | ICD-10-CM | POA: Diagnosis not present

## 2022-02-26 DIAGNOSIS — Z720 Tobacco use: Secondary | ICD-10-CM

## 2022-02-26 DIAGNOSIS — R197 Diarrhea, unspecified: Secondary | ICD-10-CM

## 2022-02-26 DIAGNOSIS — F17210 Nicotine dependence, cigarettes, uncomplicated: Secondary | ICD-10-CM

## 2022-02-26 MED ORDER — VARENICLINE TARTRATE 0.5 MG X 11 & 1 MG X 42 PO TBPK: ORAL_TABLET | ORAL | 0 refills | Status: DC

## 2022-02-26 MED ORDER — NICOTINE 14 MG/24HR TD PT24: 14.0000 mg | MEDICATED_PATCH | TRANSDERMAL | 0 refills | Status: DC

## 2022-02-26 MED ORDER — CALCIUM CARBONATE ANTACID 500 MG PO CHEW: 1.0000 | CHEWABLE_TABLET | ORAL | 3 refills | Status: DC | PRN

## 2022-02-26 MED ORDER — TIOTROPIUM BROMIDE MONOHYDRATE 18 MCG IN CAPS: 18.0000 ug | ORAL_CAPSULE | Freq: Every day | RESPIRATORY_TRACT | 12 refills | Status: DC

## 2022-02-26 NOTE — Progress Notes (Unsigned)
Subjective:  CC: diarrhea  HPI:  [name] is a 61 y.o. female with a past medical history stated below and presents today for diarrhea. Please see problem based assessment and plan for additional details.  Past Medical History:  Diagnosis Date   Anemia    Bronchitis    Headache    Hemorrhoids    Hyperlipidemia    Hypertension    Menorrhagia     Current Outpatient Medications on File Prior to Visit  Medication Sig Dispense Refill   acetaminophen (TYLENOL) 500 MG tablet Take 2 tablets (1,000 mg total) by mouth every 8 (eight) hours as needed for mild pain. 30 tablet 0   albuterol (VENTOLIN HFA) 108 (90 Base) MCG/ACT inhaler INHALE 1 TO 2 PUFFS BY MOUTH EVERY 6 HOURS AS NEEDED FOR WHEEZING OR SHORTNESS OF BREATH (Patient taking differently: Inhale 1-2 puffs into the lungs every 6 (six) hours as needed for wheezing or shortness of breath.) 8.5 g 3   chlorthalidone (HYGROTON) 25 MG tablet Take 1 tablet (25 mg total) by mouth daily. 90 tablet 3   diclofenac Sodium (VOLTAREN) 1 % GEL Apply 4 g topically 4 (four) times daily. (Patient taking differently: Apply 4 g topically See admin instructions. Apply 4 grams of gel to affected areas four times a day) 100 g 1   docusate sodium (COLACE) 100 MG capsule Take 1 capsule (100 mg total) by mouth 2 (two) times daily. 10 capsule 0   fluticasone (FLONASE) 50 MCG/ACT nasal spray Place 2 sprays into both nostrils daily. (Patient taking differently: Place 2 sprays into both nostrils daily as needed for allergies or rhinitis.) 16 g 0   nitroGLYCERIN (NITRODUR - DOSED IN MG/24 HR) 0.2 mg/hr patch Apply 1/4th patch to affected achilles, change daily (Patient taking differently: Place 0.05 mg onto the skin daily as needed (as directed to affected achilles (1/4 patch)).) 30 patch 1   pravastatin (PRAVACHOL) 20 MG tablet TAKE 1 TABLET (20 MG TOTAL) BY MOUTH DAILY. 90 tablet 2   verapamil (CALAN-SR) 120 MG CR tablet Take 1 tablet (120 mg total)  by mouth daily. 90 tablet 3   [DISCONTINUED] budesonide-formoterol (SYMBICORT) 80-4.5 MCG/ACT inhaler Inhale 2 puffs into the lungs daily. (Patient taking differently: Inhale 2 puffs into the lungs daily as needed (for flares).) 1 each 12   No current facility-administered medications on file prior to visit.    Family History  Problem Relation Age of Onset   Cancer Mother    Pancreatic cancer Mother    Bronchitis Father    Colon polyps Neg Hx    Esophageal cancer Neg Hx    Stomach cancer Neg Hx    Rectal cancer Neg Hx    Colon cancer Neg Hx    Breast cancer Neg Hx     Social History   Socioeconomic History   Marital status: Single    Spouse name: Not on file   Number of children: Not on file   Years of education: Not on file   Highest education level: Not on file  Occupational History   Not on file  Tobacco Use   Smoking status: Every Day    Packs/day: 0.25    Types: Cigarettes   Smokeless tobacco: Never   Tobacco comments:    5 per day  Vaping Use   Vaping Use: Never used  Substance and Sexual Activity   Alcohol use: Yes    Alcohol/week: 3.0 standard drinks of alcohol    Types:  3 Cans of beer per week    Comment: occasional , about weekly   Drug use: No   Sexual activity: Yes    Birth control/protection: Surgical  Other Topics Concern   Not on file  Social History Narrative   Not on file   Social Determinants of Health   Financial Resource Strain: Not on file  Food Insecurity: Not on file  Transportation Needs: Not on file  Physical Activity: Not on file  Stress: Not on file  Social Connections: Not on file  Intimate Partner Violence: Not on file    Review of Systems: ROS negative except for what is noted on the assessment and plan.  Objective:   Vitals:   02/26/22 1520 02/26/22 1550  BP: (!) 149/78 (!) 155/81  Pulse: 88 66  Temp: 98.7 F (37.1 C)   TempSrc: Oral   SpO2: 100%   Weight: 274 lb 1.6 oz (124.3 kg)   Height: '5\' 6"'$  (1.676 m)      Physical Exam: Constitutional: well-appearing, in no acute distress Cardiovascular: regular rate and rhythm, no m/r/g Pulmonary/Chest: normal work of breathing on room air, lungs clear to auscultation bilaterally Abdominal: soft, non-tender, non-distended, surgical scars well-healed MSK: normal bulk and tone  Assessment & Plan:  Hypertension Blood pressure not well controlled. 149/78 to 155/81. Medications include verapamil 120 mg  and chlorthalidone 25 mg. She has not taken blood pressure medications today. She denies headache and shortness of breath. Endorse one episode of chest pain yesterday that with further questions is consistent with GERD. She does not check her blood pressure at home. A/P: I encouraged her to take her blood pressure medications prior to office visit. -continue verapamil 120 and chlorthalidone 25. Medications that were discontinued remained on med list and list was updated.  Healthcare maintenance Flu shot given.  Diarrhea Patient with history of cholecystectomy about a month ago presents with 2 day history of diarrhea. Diarrhea is watery and she has had 3 episodes yesterday and 9/14. She would like a work note. Denies abdominal pain with episodes. On exam, abdomen is nontender, nondistended, surgical scars are well-healed. P: Work note given. I talked with her about trying to avoid fatty or fried foods after gallbladder removal as this can lead to diarrhea.  COPD (chronic obstructive pulmonary disease) ([place]) Patient has been a smoker for unknown amount of years. She smoke 3-4 cigarettes daily and does not remember when she started. While admitted for gallbladder surgery in 8/23 she was given ICS/ LABA inhaler for shortness of breath and she found this helpful. She denies increased cough or sputum production, but would like inhaler at home as she has shortness of breath with activity 2-3 times weekly. Lung are CTAB. P: With her smoking history likely she has  COPD. We talked about benefits of smoking cessation and she is interested in quitting.  -tiotropium inhaler -consider PFT in future if symptoms remain bothersome  Tobacco use She smokes 3-4 cigarettes daily. She is interested in stopping. P: Chantix Nicotine patch Follow-up in 4 weeks  GERD (gastroesophageal reflux disease) Patient endorsed episode of chest pain recently. She was laying down after eating when it started and felt midsternal pressure. Pain resolved on own. She has not had this previously. No chest pain with activities. P: Trial of tums. If symptoms are bothersome could consider pepcid in future.   Patient discussed with Dr. Walden Field Chandria Rookstool, D.O. [facility]  PGY-2 Pager: [phone]  Phone: [phone] Date 02/27/2022  Time 8:09 AM

## 2022-02-26 NOTE — Patient Instructions (Addendum)
Thank you, Ms.Carmela Rima Lafoy for allowing [place] to provide your care today.  Diarrhea I think this is likely from getting your gallbladder removed. Your body is going to have more difficulty with processing fried and fatty foods. Please try to avoid those.  Acid Reflux Your symptoms sound like acid reflux. It doesn't sound like its bothering you often. For now try TUMs to see if that helps. If more bothersome we can talk about starting pepcid when you come back in.  Blood pressure Your blood pressure was elevated without your medications today. It is important you take both verapamil and chlorthalidone daily. Please get a blood pressure cuff and check your blood pressure daily at home. Bring that log back in 4 weeks.  Tobacco cessation I am sending in Chantix and nicotine patches. Please follow-up in 4 weeks. Stopping smoking will be a huge benefit to your health, but I know this is not an easy thing to do.  Shortness of breath I have sent in the inhaler. Stopping smoking would also be a huge benefit for your lungs.   Flu vaccine- thank you for getting that done today!  I have ordered the following labs for you:  Lab Orders  No laboratory test(s) ordered today      I have ordered the following medication/changed the following medications:   Stop the following medications: Medications Discontinued During This Encounter  Medication Reason   hydrochlorothiazide (HYDRODIURIL) 25 MG tablet Change in therapy   amLODipine (NORVASC) 5 MG tablet Change in therapy   methocarbamol (ROBAXIN) 750 MG tablet Completed Course     Start the following medications: No orders of the defined types were placed in this encounter.    Follow up: 4 weeks   We look forward to seeing you next time. Please call our clinic at [phone] if you have any questions or concerns. The best time to call is Monday-Friday from 9am-4pm, but there is someone available 24/7. If after hours or the weekend, call  the main hospital number and ask for the Internal Medicine Resident On-Call. If you need medication refills, please notify your pharmacy one week in advance and they will send [place] a request.   Thank you for trusting me with your care. Wishing you the best!   Christiana Fuchs, [place]

## 2022-02-27 DIAGNOSIS — J449 Chronic obstructive pulmonary disease, unspecified: Secondary | ICD-10-CM | POA: Insufficient documentation

## 2022-02-27 DIAGNOSIS — R197 Diarrhea, unspecified: Secondary | ICD-10-CM | POA: Insufficient documentation

## 2022-02-27 DIAGNOSIS — K219 Gastro-esophageal reflux disease without esophagitis: Secondary | ICD-10-CM | POA: Insufficient documentation

## 2022-02-27 NOTE — Assessment & Plan Note (Signed)
Patient has been a smoker for unknown amount of years. She smoke 3-4 cigarettes daily and does not remember when she started. While admitted for gallbladder surgery in 8/23 she was given ICS/ LABA inhaler for shortness of breath and she found this helpful. She denies increased cough or sputum production, but would like inhaler at home as she has shortness of breath with activity 2-3 times weekly. Lung are CTAB. P: With her smoking history likely she has COPD. We talked about benefits of smoking cessation and she is interested in quitting.  -tiotropium inhaler -consider PFT in future if symptoms remain bothersome

## 2022-02-27 NOTE — Assessment & Plan Note (Signed)
Patient endorsed episode of chest pain recently. She was laying down after eating when it started and felt midsternal pressure. Pain resolved on own. She has not had this previously. No chest pain with activities. P: Trial of tums. If symptoms are bothersome could consider pepcid in future.

## 2022-02-27 NOTE — Assessment & Plan Note (Signed)
Blood pressure not well controlled. 149/78 to 155/81. Medications include verapamil 120 mg  and chlorthalidone 25 mg. She has not taken blood pressure medications today. She denies headache and shortness of breath. Endorse one episode of chest pain yesterday that with further questions is consistent with GERD. She does not check her blood pressure at home. A/P: I encouraged her to take her blood pressure medications prior to office visit. -continue verapamil 120 and chlorthalidone 25. Medications that were discontinued remained on med list and list was updated.

## 2022-02-27 NOTE — Assessment & Plan Note (Addendum)
Patient with history of cholecystectomy about a month ago presents with 2 day history of diarrhea. Diarrhea is watery and she has had 3 episodes yesterday and 9/14. She would like a work note. Denies abdominal pain with episodes. On exam, abdomen is nontender, nondistended, surgical scars are well-healed. P: Work note given. I talked with her about trying to avoid fatty or fried foods after gallbladder removal as this can lead to diarrhea.

## 2022-02-27 NOTE — Assessment & Plan Note (Signed)
She smokes 3-4 cigarettes daily. She is interested in stopping. P: Chantix Nicotine patch Follow-up in 4 weeks

## 2022-02-27 NOTE — Assessment & Plan Note (Signed)
Flu shot given

## 2022-03-03 ENCOUNTER — Other Ambulatory Visit: Payer: Self-pay | Admitting: *Deleted

## 2022-03-03 DIAGNOSIS — M7661 Achilles tendinitis, right leg: Secondary | ICD-10-CM

## 2022-03-04 ENCOUNTER — Telehealth: Payer: Self-pay | Admitting: Student

## 2022-03-04 NOTE — Telephone Encounter (Signed)
Talked to pt who's requesting pain medication for achilles tendonitis.No available appts. Pt agreed to telehealth appt - Friday 9/22 at 3PM with Dr Collene Gobble.

## 2022-03-04 NOTE — Telephone Encounter (Signed)
Pt states both of her feet are hurting really bad and she needs some medication called in for the following:  Achilles tendonitis

## 2022-03-06 ENCOUNTER — Ambulatory Visit (INDEPENDENT_AMBULATORY_CARE_PROVIDER_SITE_OTHER): Payer: BC Managed Care – PPO | Admitting: Student

## 2022-03-06 DIAGNOSIS — M7661 Achilles tendinitis, right leg: Secondary | ICD-10-CM

## 2022-03-07 NOTE — Assessment & Plan Note (Signed)
[name] initially made tele-appointment today to discuss ankle pain, which she thought was Achilles tendonitis. She reports today that her symptoms have resolved and she no longer needs today's appointment. I encouraged [name] to reach out to the clinic if her symptoms return.

## 2022-03-07 NOTE — Progress Notes (Signed)
  [facility] Internal Medicine Residency Telephone Encounter Continuity Care Appointment  HPI:  This telephone encounter was created for [name] on 03/07/2022 for the following purpose/cc  ankle pain.   Past Medical History:  Past Medical History:  Diagnosis Date   Anemia    Bronchitis    Headache    Hemorrhoids    Hyperlipidemia    Hypertension    Menorrhagia      ROS:  As per HPI   Assessment / Plan / Recommendations:  Please see A&P under problem oriented charting for assessment of the patient's acute and chronic medical conditions.  As always, pt is advised that if symptoms worsen or new symptoms arise, they should go to an urgent care facility or to to ER for further evaluation.   Consent and Medical Decision Making:  Patient discussed with Dr. Philipp Ovens This is a telephone encounter between [facility] and Sanjuan Dame on 03/07/2022 for ankle pain. The visit was conducted with the patient located at home and Sanjuan Dame at [facility]. The patient's identity was confirmed using their DOB and current address. The patient has consented to being evaluated through a telephone encounter and understands the associated risks (an examination cannot be done and the patient may need to come in for an appointment) / benefits (allows the patient to remain at home, decreasing exposure to coronavirus). I personally spent 2 minutes on medical discussion.     Achilles tendonitis Allison Mcintyre initially made tele-appointment today to discuss ankle pain, which she thought was Achilles tendonitis. She reports today that her symptoms have resolved and she no longer needs today's appointment. I encouraged Allison Mcintyre to reach out to the clinic if her symptoms return.  Sanjuan Dame, MD Internal Medicine PGY-3 Pager: [phone]

## 2022-03-09 NOTE — Progress Notes (Signed)
Internal Medicine Clinic Attending  Case discussed with Dr. Masters  at the time of the visit.  We reviewed the resident's history and exam and pertinent patient test results.  I agree with the assessment, diagnosis, and plan of care documented in the resident's note.  

## 2022-03-13 NOTE — Progress Notes (Signed)
Internal Medicine Clinic Attending ? ?Case discussed with Dr. Braswell  At the time of the visit.  We reviewed the resident?s history and exam and pertinent patient test results.  I agree with the assessment, diagnosis, and plan of care documented in the resident?s note.  ?

## 2022-03-16 ENCOUNTER — Ambulatory Visit: Payer: BC Managed Care – PPO | Admitting: Physical Therapy

## 2022-03-19 ENCOUNTER — Encounter (HOSPITAL_COMMUNITY): Payer: Self-pay

## 2022-03-19 ENCOUNTER — Ambulatory Visit (HOSPITAL_COMMUNITY)
Admission: RE | Admit: 2022-03-19 | Discharge: 2022-03-19 | Disposition: A | Discharge status: Home / Self Care | Payer: BC Managed Care – PPO | Source: Ambulatory Visit | Attending: Internal Medicine | Admitting: Internal Medicine

## 2022-03-19 VITALS — BP 150/79 | HR 85 | Temp 99.1°F | Resp 20

## 2022-03-19 DIAGNOSIS — J209 Acute bronchitis, unspecified: Secondary | ICD-10-CM | POA: Diagnosis not present

## 2022-03-19 DIAGNOSIS — W57XXXA Bitten or stung by nonvenomous insect and other nonvenomous arthropods, initial encounter: Secondary | ICD-10-CM

## 2022-03-19 DIAGNOSIS — S80862A Insect bite (nonvenomous), left lower leg, initial encounter: Secondary | ICD-10-CM

## 2022-03-19 DIAGNOSIS — Z760 Encounter for issue of repeat prescription: Secondary | ICD-10-CM

## 2022-03-19 MED ORDER — ALBUTEROL SULFATE HFA 108 (90 BASE) MCG/ACT IN AERS: 1.0000 | INHALATION_SPRAY | Freq: Four times a day (QID) | RESPIRATORY_TRACT | 0 refills | Status: DC | PRN

## 2022-03-19 MED ORDER — PREDNISONE 20 MG PO TABS: 40.0000 mg | ORAL_TABLET | Freq: Every day | ORAL | 0 refills | Status: AC

## 2022-03-19 MED ORDER — BENZONATATE 100 MG PO CAPS: 100.0000 mg | ORAL_CAPSULE | Freq: Three times a day (TID) | ORAL | 0 refills | Status: DC

## 2022-03-19 MED ORDER — DOXYCYCLINE HYCLATE 100 MG PO CAPS
200.0000 mg | ORAL_CAPSULE | Freq: Once | ORAL | 0 refills | Status: AC
Start: 2022-03-19 — End: 2022-03-19

## 2022-03-19 NOTE — ED Provider Notes (Signed)
[place]    CSN: [id number] Arrival date & time: 03/19/22  1521      History   Chief Complaint Chief Complaint  Patient presents with   Cough   Nasal Congestion   Insect Bite    HPI Allison Mcintyre Allison Mcintyre is a 61 y.o. female comes to the urgent care for painful swelling of the proximal left leg.  Patient pulled a tick from her leg a couple of days ago.  The tick was not engorged.  It was there for about 2 days.  Following the tick removal the patient noticed redness, swelling and pain.  Patient also complains of a nonproductive cough, nasal congestion of 1 week duration.  Symptoms started fairly abruptly and was associated with loss of smell and taste.  She has since regained her sense of smell and taste.  The cough initially got better and has worsened over the past few days.  She has wheezing associated with the cough and some shortness of breath with exertion.  She smokes about half a pack of cigarettes daily and has been smoking since she was in high school.  She uses an inhaler (Spiriva).  She has run out of albuterol.  No fever or chills.   HPI  Past Medical History:  Diagnosis Date   Anemia    Bronchitis    Headache    Hemorrhoids    Hyperlipidemia    Hypertension    Menorrhagia     Patient Active Problem List   Diagnosis Date Noted   Diarrhea 02/27/2022   COPD (chronic obstructive pulmonary disease) ([place]) 02/27/2022   GERD (gastroesophageal reflux disease) 02/27/2022   Choledocholithiasis 01/19/2022   Abdominal pain 11/29/2021   Strain of lumbar paraspinal muscle 10/20/2021   Leg swelling 07/24/2021   Tension headache 05/26/2021   Tobacco use 04/09/2021   Tongue lesion 03/19/2021   Healthcare maintenance 03/19/2021   Mild peripheral edema 11/19/2016   Other fatigue 11/19/2016   Daytime somnolence 11/19/2016   Anemia 04/03/2015   Palpitations 10/04/2014   Venous (peripheral) insufficiency 10/04/2014   Mass of right thigh 12/11/2013    Hypertension 12/11/2013   Achilles tendonitis 04/21/2013    Past Surgical History:  Procedure Laterality Date   ABDOMINAL HYSTERECTOMY  2003   CESAREAN SECTION     CHOLECYSTECTOMY N/A 01/19/2022   Procedure: LAPAROSCOPIC CHOLECYSTECTOMY WITH INTRAOPERATIVE CHOLANGIOGRAM;  Surgeon: Dwan Bolt, MD;  Location: [facility];  Service: General;  Laterality: N/A;   COLONOSCOPY     ERCP N/A 01/20/2022   Procedure: ENDOSCOPIC RETROGRADE CHOLANGIOPANCREATOGRAPHY (ERCP);  Surgeon: Gatha Mayer, MD;  Location: [facility];  Service: Gastroenterology;  Laterality: N/A;   POLYPECTOMY     REMOVAL OF STONES  01/20/2022   Procedure: REMOVAL OF STONES;  Surgeon: Gatha Mayer, MD;  Location: [facility];  Service: Gastroenterology;;   Joan Mayans  01/20/2022   Procedure: Joan Mayans;  Surgeon: Gatha Mayer, MD;  Location: [facility];  Service: Gastroenterology;;    OB History     Gravida  4   Para  1   Term  1   Preterm      AB  3   Living         SAB      IAB  3   Ectopic      Multiple      Live Births               Home Medications    Prior to Admission medications  Medication Sig Start Date End Date Taking? Authorizing Provider  albuterol (VENTOLIN HFA) 108 (90 Base) MCG/ACT inhaler Inhale 1-2 puffs into the lungs every 6 (six) hours as needed for wheezing or shortness of breath. 03/19/22  Yes Ezmeralda Stefanick, Myrene Galas, MD  benzonatate (TESSALON) 100 MG capsule Take 1 capsule (100 mg total) by mouth every 8 (eight) hours. 03/19/22  Yes Khoury Siemon, Myrene Galas, MD  chlorthalidone (HYGROTON) 25 MG tablet Take 1 tablet (25 mg total) by mouth daily. 04/09/21 04/09/22 Yes Timothy Lasso, MD  diclofenac Sodium (VOLTAREN) 1 % GEL Apply 4 g topically 4 (four) times daily. Patient taking differently: Apply 4 g topically See admin instructions. Apply 4 grams of gel to affected areas four times a day 03/20/21  Yes Aslam, Sadia, MD  docusate sodium (COLACE) 100 MG capsule Take 1 capsule  (100 mg total) by mouth 2 (two) times daily. 01/21/22  Yes Dwan Bolt, MD  doxycycline (VIBRAMYCIN) 100 MG capsule Take 2 capsules (200 mg total) by mouth once for 1 dose. 03/19/22 03/19/22 Yes Conroy Goracke, Myrene Galas, MD  fluticasone (FLONASE) 50 MCG/ACT nasal spray Place 2 sprays into both nostrils daily. Patient taking differently: Place 2 sprays into both nostrils daily as needed for allergies or rhinitis. 02/23/21  Yes Melynda Ripple, MD  nitroGLYCERIN (NITRODUR - DOSED IN MG/24 HR) 0.2 mg/hr patch Apply 1/4th patch to affected achilles, change daily Patient taking differently: Place 0.05 mg onto the skin daily as needed (as directed to affected achilles (1/4 patch)). 12/17/21  Yes Hudnall, Sharyn Lull, MD  pravastatin (PRAVACHOL) 20 MG tablet TAKE 1 TABLET (20 MG TOTAL) BY MOUTH DAILY. 01/01/22  Yes Rick Duff, MD  predniSONE (DELTASONE) 20 MG tablet Take 2 tablets (40 mg total) by mouth daily for 5 days. 03/19/22 03/24/22 Yes Lyza Houseworth, Myrene Galas, MD  tiotropium (SPIRIVA HANDIHALER) 18 MCG inhalation capsule Place 1 capsule (18 mcg total) into inhaler and inhale daily. 02/26/22  Yes Masters, Katie, DO  verapamil (CALAN-SR) 120 MG CR tablet Take 1 tablet (120 mg total) by mouth daily. 10/27/21 10/22/22 Yes Iona Beard, MD  budesonide-formoterol [facility]) 80-4.5 MCG/ACT inhaler Inhale 2 puffs into the lungs daily. Patient taking differently: Inhale 2 puffs into the lungs daily as needed (for flares). 04/27/21 02/26/22  Hans Eden, NP    Family History Family History  Problem Relation Age of Onset   Cancer Mother    Pancreatic cancer Mother    Bronchitis Father    Colon polyps Neg Hx    Esophageal cancer Neg Hx    Stomach cancer Neg Hx    Rectal cancer Neg Hx    Colon cancer Neg Hx    Breast cancer Neg Hx     Social History Social History   Tobacco Use   Smoking status: Every Day    Packs/day: 0.25    Types: Cigarettes   Smokeless tobacco: Never   Tobacco comments:    5 per  day  Vaping Use   Vaping Use: Never used  Substance Use Topics   Alcohol use: Yes    Alcohol/week: 3.0 standard drinks of alcohol    Types: 3 Cans of beer per week    Comment: occasional , about weekly   Drug use: No     Allergies   Patient has no known allergies.   Review of Systems Review of Systems As per HPI  Physical Exam Triage Vital Signs ED Triage Vitals [03/19/22 1537]  Enc Vitals Group     BP Marland Kitchen)  150/79     Pulse Rate 85     Resp 20     Temp 99.1 F (37.3 C)     Temp Source Oral     SpO2 94 %     Weight      Height      Head Circumference      Peak Flow      Pain Score      Pain Loc      Pain Edu?      Excl. in [place]?    No data found.  Updated Vital Signs BP (!) 150/79 (BP Location: Left Arm)   Pulse 85   Temp 99.1 F (37.3 C) (Oral)   Resp 20   SpO2 94%   Visual Acuity Right Eye Distance:   Left Eye Distance:   Bilateral Distance:    Right Eye Near:   Left Eye Near:    Bilateral Near:     Physical Exam Vitals and nursing note reviewed.  Constitutional:      General: She is not in acute distress.    Appearance: She is not ill-appearing.  Cardiovascular:     Rate and Rhythm: Normal rate and regular rhythm.  Pulmonary:     Effort: Pulmonary effort is normal.     Breath sounds: Normal breath sounds. No wheezing or rhonchi.  Abdominal:     General: Bowel sounds are normal.     Palpations: Abdomen is soft.  Musculoskeletal:        General: Swelling and tenderness present. Normal range of motion.     Comments: Tender indurated swelling over the proximal aspect of the left leg.  Indurated area measures about 3 inches in the longest diameter.  The indurated area is confined to the anterior lateral part of the left leg just distal to the knee.  Neurological:     Mental Status: She is alert.      UC Treatments / Results  Labs (all labs ordered are listed, but only abnormal results are displayed) Labs Reviewed - No data to  display  EKG   Radiology No results found.  Procedures Procedures (including critical care time)  Medications Ordered in UC Medications - No data to display  Initial Impression / Assessment and Plan / UC Course  I have reviewed the triage vital signs and the nursing notes.  Pertinent labs & imaging results that were available during my care of the patient were reviewed by me and considered in my medical decision making (see chart for details).     1.  Tick bite of the left lower leg: Doxycycline 200 mg x 1 dose No indication for blood work  2.  Acute bronchitis with bronchospasm: Albuterol inhaler Prednisone 40 mg orally daily for 5 days Tessalon Perles as needed for cough Patient is encouraged to increase oral fluid intake No indication for COVID testing given the duration of symptoms Return precautions given Lung exam: Good air entry in the lungs bilaterally. Final Clinical Impressions(s) / UC Diagnoses   Final diagnoses:  Tick bite of left lower leg, initial encounter  Acute bronchitis with bronchospasm     Discharge Instructions      Please take medications as prescribed Smoke cessation will help with your symptoms Your lung exam is reassuring If you have worsening symptoms please return to urgent care to be reevaluated.   ED Prescriptions     Medication Sig Dispense Auth. Provider   predniSONE (DELTASONE) 20 MG tablet Take 2 tablets (40  mg total) by mouth daily for 5 days. 10 tablet Feleica Fulmore, Myrene Galas, MD   doxycycline (VIBRAMYCIN) 100 MG capsule Take 2 capsules (200 mg total) by mouth once for 1 dose. 2 capsule Mancil Pfenning, Myrene Galas, MD   benzonatate (TESSALON) 100 MG capsule Take 1 capsule (100 mg total) by mouth every 8 (eight) hours. 21 capsule Char Feltman, Myrene Galas, MD   albuterol (VENTOLIN HFA) 108 (90 Base) MCG/ACT inhaler Inhale 1-2 puffs into the lungs every 6 (six) hours as needed for wheezing or shortness of breath. 6.7 g Renia Mikelson, Myrene Galas, MD       PDMP not reviewed this encounter.   Chase Picket, MD 03/19/22 Curly [place]

## 2022-03-19 NOTE — ED Triage Notes (Signed)
Pt states she had a tick bite on her lower left leg and it is swollen and hurting.   She states she has had a cough and congestion x 1 week. She is taking halls cough drops. A week ago she complains she couldn't smell or taste but she has regained since then but the cough has got worse.

## 2022-03-19 NOTE — Discharge Instructions (Addendum)
Please take medications as prescribed Smoke cessation will help with your symptoms Your lung exam is reassuring If you have worsening symptoms please return to urgent care to be reevaluated.

## 2022-04-01 ENCOUNTER — Other Ambulatory Visit: Payer: Self-pay | Admitting: Family Medicine

## 2022-04-01 ENCOUNTER — Other Ambulatory Visit: Payer: Self-pay | Admitting: Internal Medicine

## 2022-04-01 DIAGNOSIS — I10 Essential (primary) hypertension: Secondary | ICD-10-CM

## 2022-04-01 MED ORDER — CHLORTHALIDONE 25 MG PO TABS: 25.0000 mg | ORAL_TABLET | Freq: Every day | ORAL | 0 refills | Status: DC

## 2022-04-16 ENCOUNTER — Ambulatory Visit (HOSPITAL_COMMUNITY)
Admission: RE | Admit: 2022-04-16 | Discharge: 2022-04-16 | Disposition: A | Discharge status: Home / Self Care | Payer: BC Managed Care – PPO | Source: Ambulatory Visit | Attending: Physician Assistant | Admitting: Physician Assistant

## 2022-04-16 ENCOUNTER — Encounter (HOSPITAL_COMMUNITY): Payer: Self-pay

## 2022-04-16 VITALS — BP 144/82 | HR 79 | Temp 98.5°F | Resp 18

## 2022-04-16 DIAGNOSIS — M25572 Pain in left ankle and joints of left foot: Secondary | ICD-10-CM

## 2022-04-16 MED ORDER — KETOROLAC TROMETHAMINE 60 MG/2ML IM SOLN
60.0000 mg | Freq: Once | INTRAMUSCULAR | Status: AC
  Administered 2022-04-16: 60 mg via INTRAMUSCULAR

## 2022-04-16 MED ORDER — DICLOFENAC SODIUM 1 % EX GEL: 4.0000 g | Freq: Four times a day (QID) | CUTANEOUS | 1 refills | Status: DC

## 2022-04-16 MED ORDER — KETOROLAC TROMETHAMINE 60 MG/2ML IM SOLN
INTRAMUSCULAR | Status: AC
  Filled 2022-04-16: qty 2

## 2022-04-16 NOTE — ED Triage Notes (Signed)
Pt states that she has left ankle pain x 1 week. States that she needs a pain shot. She isnt taking anything for the pain. Needs some more voltaren gel. She had nitro patches for the achilles pain but she stopped using them due to headaches.

## 2022-04-16 NOTE — ED Provider Notes (Signed)
[place]    CSN: [id number] Arrival date & time: 04/16/22  1601      History   Chief Complaint Chief Complaint  Patient presents with   Ankle Pain    HPI Allison Mcintyre is a 61 y.o. female.   Pt complains of pain in her ankle.  Pt reports she has achilles tendonitis.  Pt is out of voltaren gel.    Pt request a shot of toradol and an ace wrap.    The history is provided by the patient. No language interpreter was used.  Ankle Pain Location:  Ankle Injury: no   Ankle location:  L ankle Pain details:    Quality:  Aching   Past Medical History:  Diagnosis Date   Anemia    Bronchitis    Headache    Hemorrhoids    Hyperlipidemia    Hypertension    Menorrhagia     Patient Active Problem List   Diagnosis Date Noted   Diarrhea 02/27/2022   COPD (chronic obstructive pulmonary disease) ([place]) 02/27/2022   GERD (gastroesophageal reflux disease) 02/27/2022   Choledocholithiasis 01/19/2022   Abdominal pain 11/29/2021   Strain of lumbar paraspinal muscle 10/20/2021   Leg swelling 07/24/2021   Tension headache 05/26/2021   Tobacco use 04/09/2021   Tongue lesion 03/19/2021   Healthcare maintenance 03/19/2021   Mild peripheral edema 11/19/2016   Other fatigue 11/19/2016   Daytime somnolence 11/19/2016   Anemia 04/03/2015   Palpitations 10/04/2014   Venous (peripheral) insufficiency 10/04/2014   Mass of right thigh 12/11/2013   Hypertension 12/11/2013   Achilles tendonitis 04/21/2013    Past Surgical History:  Procedure Laterality Date   ABDOMINAL HYSTERECTOMY  2003   CESAREAN SECTION     CHOLECYSTECTOMY N/A 01/19/2022   Procedure: LAPAROSCOPIC CHOLECYSTECTOMY WITH INTRAOPERATIVE CHOLANGIOGRAM;  Surgeon: Dwan Bolt, MD;  Location: [facility];  Service: General;  Laterality: N/A;   COLONOSCOPY     ERCP N/A 01/20/2022   Procedure: ENDOSCOPIC RETROGRADE CHOLANGIOPANCREATOGRAPHY (ERCP);  Surgeon: Gatha Mayer, MD;  Location: [facility];  Service:  Gastroenterology;  Laterality: N/A;   POLYPECTOMY     REMOVAL OF STONES  01/20/2022   Procedure: REMOVAL OF STONES;  Surgeon: Gatha Mayer, MD;  Location: [facility];  Service: Gastroenterology;;   Joan Mayans  01/20/2022   Procedure: Joan Mayans;  Surgeon: Gatha Mayer, MD;  Location: [facility];  Service: Gastroenterology;;    OB History     Gravida  4   Para  1   Term  1   Preterm      AB  3   Living         SAB      IAB  3   Ectopic      Multiple      Live Births               Home Medications    Prior to Admission medications   Medication Sig Start Date End Date Taking? Authorizing Provider  albuterol (VENTOLIN HFA) 108 (90 Base) MCG/ACT inhaler Inhale 1-2 puffs into the lungs every 6 (six) hours as needed for wheezing or shortness of breath. 03/19/22  Yes Lamptey, Myrene Galas, MD  chlorthalidone (HYGROTON) 25 MG tablet Take 1 tablet (25 mg total) by mouth daily. 04/01/22 06/30/22 Yes Multani, Bhupinder, MD  diclofenac Sodium (VOLTAREN) 1 % GEL Apply 4 g topically 4 (four) times daily. 04/16/22  Yes Caryl Ada K, PA-C  fluticasone (FLONASE) 50 MCG/ACT  nasal spray Place 2 sprays into both nostrils daily. Patient taking differently: Place 2 sprays into both nostrils daily as needed for allergies or rhinitis. 02/23/21  Yes Melynda Ripple, MD  pravastatin (PRAVACHOL) 20 MG tablet TAKE 1 TABLET (20 MG TOTAL) BY MOUTH DAILY. 01/01/22  Yes Rick Duff, MD  tiotropium (SPIRIVA HANDIHALER) 18 MCG inhalation capsule Place 1 capsule (18 mcg total) into inhaler and inhale daily. 02/26/22  Yes Masters, Katie, DO  verapamil (CALAN-SR) 120 MG CR tablet Take 1 tablet (120 mg total) by mouth daily. 10/27/21 10/22/22 Yes Iona Beard, MD  benzonatate (TESSALON) 100 MG capsule Take 1 capsule (100 mg total) by mouth every 8 (eight) hours. 03/19/22   Lamptey, Myrene Galas, MD  docusate sodium (COLACE) 100 MG capsule Take 1 capsule (100 mg total) by mouth 2 (two) times  daily. 01/21/22   Dwan Bolt, MD  nitroGLYCERIN (NITRODUR - DOSED IN MG/24 HR) 0.2 mg/hr patch Apply 1/4th patch to affected achilles, change daily Patient taking differently: Place 0.05 mg onto the skin daily as needed (as directed to affected achilles (1/4 patch)). 12/17/21   Hudnall, Sharyn Lull, MD  budesonide-formoterol (SYMBICORT) 80-4.5 MCG/ACT inhaler Inhale 2 puffs into the lungs daily. Patient taking differently: Inhale 2 puffs into the lungs daily as needed (for flares). 04/27/21 02/26/22  Hans Eden, NP    Family History Family History  Problem Relation Age of Onset   Cancer Mother    Pancreatic cancer Mother    Bronchitis Father    Colon polyps Neg Hx    Esophageal cancer Neg Hx    Stomach cancer Neg Hx    Rectal cancer Neg Hx    Colon cancer Neg Hx    Breast cancer Neg Hx     Social History Social History   Tobacco Use   Smoking status: Every Day    Packs/day: 0.25    Types: Cigarettes   Smokeless tobacco: Never   Tobacco comments:    5 per day  Vaping Use   Vaping Use: Never used  Substance Use Topics   Alcohol use: Yes    Alcohol/week: 3.0 standard drinks of alcohol    Types: 3 Cans of beer per week    Comment: occasional , about weekly   Drug use: No     Allergies   Patient has no known allergies.   Review of Systems Review of Systems  All other systems reviewed and are negative.    Physical Exam Triage Vital Signs ED Triage Vitals  Enc Vitals Group     BP 04/16/22 1629 (!) 144/82     Pulse Rate 04/16/22 1629 79     Resp 04/16/22 1629 18     Temp 04/16/22 1629 98.5 F (36.9 C)     Temp Source 04/16/22 1629 Oral     SpO2 04/16/22 1629 95 %     Weight --      Height --      Head Circumference --      Peak Flow --      Pain Score 04/16/22 1625 10     Pain Loc --      Pain Edu? --      Excl. in [place]? --    No data found.  Updated Vital Signs BP (!) 144/82 (BP Location: Left Arm)   Pulse 79   Temp 98.5 F (36.9 C) (Oral)    Resp 18   SpO2 95%   Visual Acuity Right Eye Distance:  Left Eye Distance:   Bilateral Distance:    Right Eye Near:   Left Eye Near:    Bilateral Near:     Physical Exam Vitals and nursing note reviewed.  Constitutional:      Appearance: She is well-developed.  HENT:     Head: Normocephalic.  Cardiovascular:     Rate and Rhythm: Normal rate.  Pulmonary:     Effort: Pulmonary effort is normal.  Abdominal:     General: There is no distension.  Musculoskeletal:        General: Swelling present.     Cervical back: Normal range of motion.  Skin:    General: Skin is warm.  Neurological:     General: No focal deficit present.     Mental Status: She is alert and oriented to person, place, and time.  Psychiatric:        Mood and Affect: Mood normal.      UC Treatments / Results  Labs (all labs ordered are listed, but only abnormal results are displayed) Labs Reviewed - No data to display  EKG   Radiology No results found.  Procedures Procedures (including critical care time)  Medications Ordered in UC Medications  ketorolac (TORADOL) injection 60 mg (has no administration in time range)    Initial Impression / Assessment and Plan / UC Course  I have reviewed the triage vital signs and the nursing notes.  Pertinent labs & imaging results that were available during my care of the patient were reviewed by me and considered in my medical decision making (see chart for details).    Final Clinical Impressions(s) / UC Diagnoses   Final diagnoses:  Acute left ankle pain   Discharge Instructions   None    ED Prescriptions     Medication Sig Dispense Auth. Provider   diclofenac Sodium (VOLTAREN) 1 % GEL Apply 4 g topically 4 (four) times daily. 4 g Fransico Meadow, [place]      PDMP not reviewed this encounter.   Fransico Meadow, [place] 04/16/22 1712

## 2022-04-25 ENCOUNTER — Ambulatory Visit (HOSPITAL_COMMUNITY): Payer: BC Managed Care – PPO

## 2022-04-28 ENCOUNTER — Ambulatory Visit: Payer: BC Managed Care – PPO | Admitting: Student

## 2022-04-28 ENCOUNTER — Ambulatory Visit (HOSPITAL_COMMUNITY)
Admission: RE | Admit: 2022-04-28 | Discharge: 2022-04-28 | Disposition: A | Discharge status: Home / Self Care | Payer: BC Managed Care – PPO | Source: Ambulatory Visit | Attending: Internal Medicine | Admitting: Internal Medicine

## 2022-04-28 ENCOUNTER — Encounter: Payer: Self-pay | Admitting: Student

## 2022-04-28 ENCOUNTER — Other Ambulatory Visit: Payer: Self-pay

## 2022-04-28 VITALS — BP 165/96 | HR 88 | Temp 98.3°F | Ht 66.0 in | Wt 283.3 lb

## 2022-04-28 DIAGNOSIS — M25572 Pain in left ankle and joints of left foot: Secondary | ICD-10-CM | POA: Insufficient documentation

## 2022-04-28 DIAGNOSIS — T7491XA Unspecified adult maltreatment, confirmed, initial encounter: Secondary | ICD-10-CM | POA: Diagnosis not present

## 2022-04-28 DIAGNOSIS — G8929 Other chronic pain: Secondary | ICD-10-CM | POA: Diagnosis not present

## 2022-04-28 DIAGNOSIS — F17210 Nicotine dependence, cigarettes, uncomplicated: Secondary | ICD-10-CM

## 2022-04-28 DIAGNOSIS — I10 Essential (primary) hypertension: Secondary | ICD-10-CM | POA: Diagnosis not present

## 2022-04-28 DIAGNOSIS — Z720 Tobacco use: Secondary | ICD-10-CM

## 2022-04-28 MED ORDER — OLMESARTAN MEDOXOMIL 20 MG PO TABS: 20.0000 mg | ORAL_TABLET | Freq: Every day | ORAL | 5 refills | Status: DC

## 2022-04-28 MED ORDER — NICOTINE 14 MG/24HR TD PT24: 14.0000 mg | MEDICATED_PATCH | TRANSDERMAL | 6 refills | Status: DC
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
## 2022-04-28 NOTE — Assessment & Plan Note (Signed)
Patient previously prescribed chantix but only took one pill. She smokes a pack of cigarettes every 5-7d.   Plan: She will start her chantix and a new script for patch was sent.

## 2022-04-28 NOTE — Patient Instructions (Addendum)
For your ankle pain, please use 5 days of ibuprofen '400mg'$  every 6 hours. (Four times a day)  Please use compression socks when you are at home.   We will call you with the results of the x ray.   Please take the chantix for your smoking, as well as the patches.
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
## 2022-04-28 NOTE — Assessment & Plan Note (Addendum)
Patient states that she is adherent with her medications of chlorthalidone '25mg'$  and verapamil '120mg'$  SR daily. She was previously on amlodipine, but this caused her swelling. She was started on verapamil 09/2021 as an alternative to her amlodipine.   Plan: Her previous labs 01/2022 revealed normal kidney function. Will start patient on olmesartan '20mg'$ , and she will follow up in 2 weeks for BMP to check her serum creatinine and potassium. At that visit would start titrating her off of her verapamil as it does not appear that she as any other comorbid conditions that would necessitate its use. This could also be contributing to her lower extremity swelling.

## 2022-04-28 NOTE — Assessment & Plan Note (Signed)
Patient presents with one month of L ankle pain that is worse with standing and movement. She has also noticed increased swelling in the region. She denies any trauma to the foot. She states the pain only improves when she is not weight bearing. She has not tried any medications for her L ankle pain. She denies fevers or chills.   On exam:  Afebrile L ankle is TTP at L lateral malleolus, she has increased swelling relative to her R ankle. She has no erythema or warmth of the overlying skin. Pain in the L lateral malleolus is elicited with eversion and plantar flexion of the L foot. She has an antalgic gait  Unclear etiology of her chronic L ankle pain that occurred in the setting of no trauma to the L ankle. She is not at the age for osteoporosis screening. 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 She could have possibly injured herself at this time. She has also had previous tendonitis of the L achilles.   It is possible that she has a peroneal tendinopathy caused by over use. Given she has had no trauma to the injury it is less likely that she has a fracture though this could still be a possibility.  -Obtain L ankle xray -5 d course of ibuprofen '400mg'$  q6 hours -Elevation of the foot to help with swelling

## 2022-04-29 ENCOUNTER — Telehealth: Payer: Self-pay | Admitting: *Deleted

## 2022-04-29 NOTE — Progress Notes (Signed)
  Care Coordination   Note   04/29/2022 Name: Allison Mcintyre MRN: [id number] DOB: Nov 13, 1960  Allison Mcintyre is a 61 y.o. year old female who sees Rick Duff, MD for primary care. I reached out to [name] by phone today to offer care coordination services.  [name] was given information about Care Coordination services today including:   The Care Coordination services include support from the care team which includes your Nurse Coordinator, Clinical Social Worker, or Pharmacist.  The Care Coordination team is here to help remove barriers to the health concerns and goals most important to you. Care Coordination services are voluntary, and the patient may decline or stop services at any time by request to their care team member.   Care Coordination Consent Status: Patient agreed to services and verbal consent obtained.   Follow up plan:  Telephone appointment with care coordination team member scheduled for:  04/30/22  Encounter Outcome:  Pt. Scheduled  [place]  Direct Dial: [phone]

## 2022-04-30 ENCOUNTER — Encounter: Payer: BC Managed Care – PPO | Admitting: Licensed Clinical Social Worker

## 2022-04-30 ENCOUNTER — Telehealth: Payer: Self-pay

## 2022-04-30 ENCOUNTER — Telehealth: Payer: Self-pay | Admitting: Student

## 2022-04-30 NOTE — Telephone Encounter (Signed)
Requesting x-ray test result, please call pt back.

## 2022-05-01 ENCOUNTER — Ambulatory Visit: Payer: Self-pay | Admitting: Licensed Clinical Social Worker
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
## 2022-05-04 ENCOUNTER — Ambulatory Visit: Payer: BC Managed Care – PPO | Admitting: Podiatry

## 2022-05-06 NOTE — Telephone Encounter (Signed)
Updated patient with results of her x ray. Noted that she did not have a fracture.   Discussed with patient that she should continue to use ibuprofen for 5 days, and use compression when she is working. She is agreeable with this plan. If her pain persists despite these measures will consider MRI of the foot to see if patient has possible soft tissue injury that may require surgical intervention.

## 2022-05-09 ENCOUNTER — Telehealth: Payer: Self-pay | Admitting: Student

## 2022-05-09 NOTE — Telephone Encounter (Signed)
Called pt to see how her L ankle was doing. She states that it is much better and she is only taking the ibuprofen intermittently. Discussed with patient that she should take the ibuprofen scheduled for 5 days straight in addition to her tylenol. If she continues to have pain after that she should call us for an appointment, further work up may be required. She is agreeable to that. Explained that she should not take ibuprofen long term as this can cause her to have ulcers, increased swelling in the LE, and kidney dysfunction. She states that she understands.    She will also wear compression stockings to help with her lower extremity swelling that is most pronounced at the end of her work shift.   Last CMP 01/20/22 patient had nl scr, she has no diabetes, she does have HTN. Suspect she has venous insufficiency, would consider TTE at next appointment if swelling persists.

## 2022-05-10 NOTE — Progress Notes (Signed)
Internal Medicine Clinic Attending   I saw and evaluated the patient.  I personally confirmed the key portions of the history and exam documented by Dr. Carter and I reviewed pertinent patient test results.  The assessment, diagnosis, and plan were formulated together and I agree with the documentation in the resident's note.  

## 2022-05-18 ENCOUNTER — Telehealth: Payer: Self-pay | Admitting: Student

## 2022-05-18 NOTE — Telephone Encounter (Signed)
Spoke w/ Allison Mcintyre and she is still having significant pain in her L ankle despite taking a course of NSAIDs and APAP. Discussed with her that she should schedule an appointment with [place] and that she may need further imaging to help determine the source of her discomfort. She agreed and will call [facility] this AM.

## 2022-05-21 ENCOUNTER — Other Ambulatory Visit: Payer: Self-pay | Admitting: Student

## 2022-05-21 ENCOUNTER — Ambulatory Visit (INDEPENDENT_AMBULATORY_CARE_PROVIDER_SITE_OTHER): Payer: BC Managed Care – PPO | Admitting: Internal Medicine

## 2022-05-21 VITALS — BP 134/62 | HR 69 | Temp 98.2°F | Ht 66.0 in | Wt 286.2 lb

## 2022-05-21 DIAGNOSIS — M25572 Pain in left ankle and joints of left foot: Secondary | ICD-10-CM | POA: Diagnosis not present

## 2022-05-21 DIAGNOSIS — F17210 Nicotine dependence, cigarettes, uncomplicated: Secondary | ICD-10-CM | POA: Diagnosis not present

## 2022-05-21 DIAGNOSIS — Z1231 Encounter for screening mammogram for malignant neoplasm of breast: Secondary | ICD-10-CM

## 2022-05-21 DIAGNOSIS — I10 Essential (primary) hypertension: Secondary | ICD-10-CM | POA: Diagnosis not present

## 2022-05-21 NOTE — Progress Notes (Signed)
   CC: L ankle pain  HPI:  [name] is a 61 y.o. person with past medical history as detailed below who presents for follow up of L ankle pain. Please see problem based charting for detailed assessment and plan.  Past Medical History:  Diagnosis Date   Anemia    Bronchitis    Headache    Hemorrhoids    Hyperlipidemia    Hypertension    Menorrhagia    Review of Systems:  Negative unless otherwise stated.  Physical Exam:  Vitals:   05/21/22 1448 05/21/22 1517  BP: (!) 149/68 134/62  Pulse: 71 69  Temp: 98.2 F (36.8 C)   TempSrc: Oral   SpO2: 97%   Weight: 286 lb 3.2 oz (129.8 kg)   Height: '5\' 6"'$  (1.676 m)    Constitutional:Seated in wheelchair, appears uncomfortable and in pain.. Cardio:Regular rate and rhythm. Pulm: Normal work of breathing on room air. MSK:L ankle is edemetous on medial lateral aspects witout increased warmth or erythema. She has pain with plantar flexion and eversion which is greater than dorsiflexion and inversion. She has pain with palpation of anterior aspect of the distal lateral malleolus. There is no joint laxity or instability . Skin:Warm and dry. No bruising or lacerations. Neuro:Alert and oriented x3. No focal deficit noted. Strength is 5/5 in L plantar and dorsiflexion. Psych:Pleasant mood and affect.  Assessment & Plan:   See Encounters Tab for problem based charting.  Hypertension BP above goal today, 149/68 with recheck of 134/62. Current regimen is olmesartan 20 mg daily, verapamill 120 mg daily. Assessment:Patient was eager to leave after her ankle pain was addressed. HTN and medications were not addressed. Plan:At next OV please recheck BMP today and consider increasing olmesartan and decreasing verapamil.   Left ankle pain Patient was seen 11/14 for left ankle pain. It began several months ago at the achilles tendon, resolved, then returned at the lateral ankle. The pain was described as worse on waking and with with  weight bearing, with improvement when she was not weight bearing. She had not had any trauma. She is currently taking ibuprofen 600 mg q4h and tylenol 500 mg q4h.  She struggles when any walking and ascending stairs. She is not wearing compression stockings because her ambulation is severely limited so she has not been able to make her way around a store to find stockings (power chairs are not available when she has tried).  L ankle x-ray obtained 04/30/2022 showed severe soft tissue edema about the ankle, particularly of the medial malleolus.   On exam her L ankle is edemetous on medial lateral aspects witout increased warmth or erythema. She has pain with plantar flexion and eversion which is greater than dorsiflexion and inversion. She has pain with palpation of anterior aspect of the distal lateral malleolus. There is no joint laxity or instability appreciated on exam. She reiterates that she has not had trauma to the foot/ankle, including rolling her ankle.  Plan:Will obtain MRI L ankle for evaluation of soft and connective tissue. She may need referral to orthopedics.  Patient seen with Dr. Philipp Ovens

## 2022-05-22 NOTE — Assessment & Plan Note (Signed)
BP above goal today, 149/68 with recheck of 134/62. Current regimen is olmesartan 20 mg daily, verapamill 120 mg daily. Assessment:Patient was eager to leave after her ankle pain was addressed. HTN and medications were not addressed. Plan:At next OV please recheck BMP today and consider increasing olmesartan and decreasing verapamil.

## 2022-05-22 NOTE — Assessment & Plan Note (Signed)
Patient was seen 11/14 for left ankle pain. It began several months ago at the achilles tendon, resolved, then returned at the lateral ankle. The pain was described as worse on waking and with with weight bearing, with improvement when she was not weight bearing. She had not had any trauma. She is currently taking ibuprofen 600 mg q4h and tylenol 500 mg q4h.  She struggles when any walking and ascending stairs. She is not wearing compression stockings because her ambulation is severely limited so she has not been able to make her way around a store to find stockings (power chairs are not available when she has tried).  L ankle x-ray obtained 04/30/2022 showed severe soft tissue edema about the ankle, particularly of the medial malleolus.   On exam her L ankle is edemetous on medial lateral aspects witout increased warmth or erythema. She has pain with plantar flexion and eversion which is greater than dorsiflexion and inversion. She has pain with palpation of anterior aspect of the distal lateral malleolus. There is no joint laxity or instability appreciated on exam. She reiterates that she has not had trauma to the foot/ankle, including rolling her ankle.  Plan:Will obtain MRI L ankle for evaluation of soft and connective tissue. She may need referral to orthopedics.

## 2022-05-22 NOTE — Addendum Note (Signed)
Addended by: Renato Battles on: 05/22/2022 09:47 AM   Modules accepted: Orders

## 2022-05-25 NOTE — Progress Notes (Signed)
Internal Medicine Clinic Attending  I saw and evaluated the patient.  I personally confirmed the key portions of the history and exam documented by Dr.  Dean  and I reviewed pertinent patient test results.  The assessment, diagnosis, and plan were formulated together and I agree with the documentation in the resident's note.  

## 2022-06-16 ENCOUNTER — Other Ambulatory Visit: Payer: Self-pay

## 2022-06-16 NOTE — Telephone Encounter (Signed)
error 

## 2022-06-21 ENCOUNTER — Ambulatory Visit (HOSPITAL_COMMUNITY): Payer: BC Managed Care – PPO

## 2022-06-21 ENCOUNTER — Ambulatory Visit (HOSPITAL_COMMUNITY)
Admission: RE | Admit: 2022-06-21 | Discharge: 2022-06-21 | Disposition: A | Discharge status: Home / Self Care | Payer: BC Managed Care – PPO | Source: Ambulatory Visit | Attending: Physician Assistant | Admitting: Physician Assistant

## 2022-06-21 VITALS — BP 169/78 | HR 77 | Temp 98.9°F | Resp 18

## 2022-06-21 DIAGNOSIS — M25571 Pain in right ankle and joints of right foot: Secondary | ICD-10-CM

## 2022-06-21 MED ORDER — NAPROXEN 375 MG PO TABS: 375.0000 mg | ORAL_TABLET | Freq: Two times a day (BID) | ORAL | 0 refills | Status: DC

## 2022-06-21 NOTE — ED Triage Notes (Signed)
Pt presents to the office for right ankle pain for several days. Pt is schedule for her MRI in a couple weeks. Pt will need doctor's note to return to work.
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
## 2022-06-21 NOTE — Discharge Instructions (Signed)
Advised take the Naprosyn 375 mg every 12 hours with food to see if this will help reduce the left ankle pain and discomfort.  Advised to follow-up with PCP while awaiting for left ankle MRI scan.  Return to urgent care as needed.

## 2022-07-04 ENCOUNTER — Ambulatory Visit (HOSPITAL_COMMUNITY): Payer: BC Managed Care – PPO

## 2022-07-04 ENCOUNTER — Ambulatory Visit (HOSPITAL_COMMUNITY)
Admission: RE | Admit: 2022-07-04 | Discharge: 2022-07-04 | Disposition: A | Discharge status: Home / Self Care | Payer: BC Managed Care – PPO | Source: Ambulatory Visit | Attending: Internal Medicine | Admitting: Internal Medicine

## 2022-07-04 DIAGNOSIS — M25572 Pain in left ankle and joints of left foot: Secondary | ICD-10-CM | POA: Insufficient documentation

## 2022-07-06 ENCOUNTER — Telehealth: Payer: Self-pay

## 2022-07-06 NOTE — Telephone Encounter (Signed)
Requesting MRI test results, please call pt back.

## 2022-07-08 ENCOUNTER — Telehealth: Payer: Self-pay | Admitting: Student

## 2022-07-08 DIAGNOSIS — G8929 Other chronic pain: Secondary | ICD-10-CM

## 2022-07-08 NOTE — Telephone Encounter (Signed)
Discussed patient's MRI results and discussed with her that we would refer her to [facility]. She is in agreement with this plan.

## 2022-07-14 ENCOUNTER — Ambulatory Visit (HOSPITAL_COMMUNITY): Payer: BC Managed Care – PPO

## 2022-07-15 ENCOUNTER — Encounter: Payer: Self-pay | Admitting: Podiatry

## 2022-07-15 ENCOUNTER — Ambulatory Visit (INDEPENDENT_AMBULATORY_CARE_PROVIDER_SITE_OTHER): Payer: BC Managed Care – PPO

## 2022-07-15 ENCOUNTER — Ambulatory Visit: Payer: BC Managed Care – PPO | Admitting: Podiatry

## 2022-07-15 ENCOUNTER — Ambulatory Visit: Payer: BC Managed Care – PPO

## 2022-07-15 DIAGNOSIS — M7752 Other enthesopathy of left foot: Secondary | ICD-10-CM

## 2022-07-15 DIAGNOSIS — M7750 Other enthesopathy of unspecified foot: Secondary | ICD-10-CM

## 2022-07-15 DIAGNOSIS — M7751 Other enthesopathy of right foot: Secondary | ICD-10-CM | POA: Diagnosis not present

## 2022-07-15 MED ORDER — TRIAMCINOLONE ACETONIDE 10 MG/ML IJ SUSP
10.0000 mg | Freq: Once | INTRAMUSCULAR | Status: AC
  Administered 2022-07-15: 10 mg

## 2022-07-15 MED ORDER — PREDNISONE 10 MG PO TABS: ORAL_TABLET | ORAL | 0 refills | Status: DC

## 2022-07-15 NOTE — Progress Notes (Signed)
Subjective:   Patient ID: Allison Mcintyre, female   DOB: 62 y.o.   MRN: [id number]   HPI Patient presents stating she has had a lot of pain in her left ankle which is giving her some problems for around a year but over the last couple months it has been worse and hard to walk on.  Patient does smoke quarter pack of cigarettes per day tries to be active and is obese which is complicating factor.  Patient states the pain has been intense recent   Review of Systems  All other systems reviewed and are negative.       Objective:  Physical Exam Vitals and nursing note reviewed.  Constitutional:      Appearance: She is well-developed.  Pulmonary:     Effort: Pulmonary effort is normal.  Musculoskeletal:        General: Normal range of motion.  Skin:    General: Skin is warm.  Neurological:     Mental Status: She is alert.     Neurovascular status intact muscle strength was found to be adequate range of motion adequate with inflammation pain of the left ankle that is very sore in the sinus tarsi and around the lateral ankle gutter.  Patient had MRI which indicates there could be some partial tearing of the anterior talofibular ligament and also there could be some slight tearing around Achilles or peroneal but those do not appear to be involved with this acute process     Assessment:  Appears to be an acute inflammation of the sinus tarsi right and into the ankle joint that is very painful and almost impossible for her to walk on     Plan:  H&P x-rays reviewed MRI reviewed went ahead today did sterile prep and injected the sinus tarsi ankle 3 mg Kenalog 5 mg Xylocaine applied air fracture walker to completely immobilize placed on Sterapred Dosepak and will reappoint again in the next several weeks.  May require more aggressive treatment depending on response  X-rays indicate that there is mild arthritis but I do not see signs of severe arthritis subtalar or ankle joint

## 2022-07-20 ENCOUNTER — Ambulatory Visit: Payer: BC Managed Care – PPO

## 2022-08-05 ENCOUNTER — Encounter: Payer: Self-pay | Admitting: Podiatry

## 2022-08-05 ENCOUNTER — Other Ambulatory Visit: Payer: Self-pay | Admitting: Student

## 2022-08-05 ENCOUNTER — Ambulatory Visit (INDEPENDENT_AMBULATORY_CARE_PROVIDER_SITE_OTHER): Payer: BC Managed Care – PPO | Admitting: Podiatry

## 2022-08-05 DIAGNOSIS — M7672 Peroneal tendinitis, left leg: Secondary | ICD-10-CM

## 2022-08-05 MED ORDER — TRIAMCINOLONE ACETONIDE 10 MG/ML IJ SUSP: 10.0000 mg | Freq: Once | INTRAMUSCULAR | Status: DC

## 2022-08-05 MED ORDER — TRIAMCINOLONE ACETONIDE 10 MG/ML IJ SUSP
10.0000 mg | Freq: Once | INTRAMUSCULAR | Status: AC
  Administered 2022-08-05: 10 mg

## 2022-08-05 MED ORDER — DICLOFENAC SODIUM 75 MG PO TBEC: 75.0000 mg | DELAYED_RELEASE_TABLET | Freq: Two times a day (BID) | ORAL | 2 refills | Status: DC

## 2022-08-05 NOTE — Progress Notes (Signed)
Subjective:   Patient ID: Allison Mcintyre, female   DOB: 62 y.o.   MRN: [id number]   HPI Patient presents stating that she did not take her steroid as she was supposed to and it has been inflamed on the outside of the foot not in the area that we did the medication last time   ROS      Objective:  Physical Exam  Neurovascular status intact sinus tarsi is doing well but there is quite a bit of discomfort around the peroneal tendon group left Achilles doing well boot doing well     Assessment:  Patient did not take the anti-inflammatories we have requested and does have inflammation that is more into the peroneal group as it comes underneath the lateral malleolus     Plan:  I recommended continued immobilization for several more weeks and I did do sterile prep and I injected the peroneal tendon as it comes underneath the lateral malleolus 3 mg Dexasone Kenalog 5 mg Xylocaine after explaining risk continue boot usage placed on oral diclofenac twice a day and reappoint 4 weeks

## 2022-08-14 ENCOUNTER — Encounter: Payer: Self-pay | Admitting: Internal Medicine

## 2022-08-14 ENCOUNTER — Ambulatory Visit (INDEPENDENT_AMBULATORY_CARE_PROVIDER_SITE_OTHER): Payer: BC Managed Care – PPO | Admitting: Internal Medicine

## 2022-08-14 VITALS — BP 160/77 | HR 81 | Temp 98.1°F | Resp 24 | Ht 66.0 in

## 2022-08-14 DIAGNOSIS — R6889 Other general symptoms and signs: Secondary | ICD-10-CM | POA: Diagnosis not present

## 2022-08-14 DIAGNOSIS — B9789 Other viral agents as the cause of diseases classified elsewhere: Secondary | ICD-10-CM

## 2022-08-14 DIAGNOSIS — J019 Acute sinusitis, unspecified: Secondary | ICD-10-CM

## 2022-08-14 DIAGNOSIS — I10 Essential (primary) hypertension: Secondary | ICD-10-CM | POA: Diagnosis not present

## 2022-08-14 NOTE — Patient Instructions (Signed)
Ms.Cadence V Better, it was a pleasure seeing you today! You endorsed feeling well today. Below are some of the things we talked about this visit. We look forward to seeing you in the follow up appointment!  Today we discussed: You reported symptoms of coughing with mucus production, and sinus congestion. We will test you for flu/covid/rsv. You can take OTC robitussin and use nasal rinses as I have outlined for you.   I have ordered the following labs today:   Lab Orders         COVID-19, Flu A+B and RSV       Referrals ordered today:   Referral Orders  No referral(s) requested today     I have ordered the following medication/changed the following medications:   Stop the following medications: There are no discontinued medications.   Start the following medications: No orders of the defined types were placed in this encounter.    Follow-up: 1 week follow up  Please make sure to arrive 15 minutes prior to your next appointment. If you arrive late, you may be asked to reschedule.   We look forward to seeing you next time. Please call our clinic at [phone] if you have any questions or concerns. The best time to call is Monday-Friday from 9am-4pm, but there is someone available 24/7. If after hours or the weekend, call the main hospital number and ask for the Internal Medicine Resident On-Call. If you need medication refills, please notify your pharmacy one week in advance and they will send [place] a request.  Thank you for letting us take part in your care. Wishing you the best!  Thank you, Idamae Schuller, MD

## 2022-08-14 NOTE — Progress Notes (Unsigned)
CC: flu like symptoms  HPI:  [name] is a 62 y.o. with medical history of HTN, HLD, COPD and OA presenting to [facility] for flu like symptoms.   Please see problem-based list for further details, assessments, and plans.  Past Medical History:  Diagnosis Date   Anemia    Bronchitis    Headache    Hemorrhoids    Hyperlipidemia    Hypertension    Menorrhagia     Current Outpatient Medications (Endocrine & Metabolic):    predniSONE (DELTASONE) 10 MG tablet, 12 day tapering dose  Current Outpatient Medications (Cardiovascular):    chlorthalidone (HYGROTON) 25 MG tablet, Take 1 tablet (25 mg total) by mouth daily.   nitroGLYCERIN (NITRODUR - DOSED IN MG/24 HR) 0.2 mg/hr patch, Apply 1/4th patch to affected achilles, change daily (Patient taking differently: Place 0.05 mg onto the skin daily as needed (as directed to affected achilles (1/4 patch)).)   olmesartan (BENICAR) 20 MG tablet, Take 1 tablet (20 mg total) by mouth daily.   pravastatin (PRAVACHOL) 20 MG tablet, TAKE 1 TABLET (20 MG TOTAL) BY MOUTH DAILY.   verapamil (CALAN-SR) 120 MG CR tablet, TAKE 1 TABLET (120 MG TOTAL) BY MOUTH DAILY.  Current Outpatient Medications (Respiratory):    albuterol (VENTOLIN HFA) 108 (90 Base) MCG/ACT inhaler, Inhale 1-2 puffs into the lungs every 6 (six) hours as needed for wheezing or shortness of breath.   benzonatate (TESSALON) 100 MG capsule, Take 1 capsule (100 mg total) by mouth every 8 (eight) hours.   fluticasone (FLONASE) 50 MCG/ACT nasal spray, Place 2 sprays into both nostrils daily. (Patient taking differently: Place 2 sprays into both nostrils daily as needed for allergies or rhinitis.)   tiotropium (SPIRIVA HANDIHALER) 18 MCG inhalation capsule, Place 1 capsule (18 mcg total) into inhaler and inhale daily.  Current Outpatient Medications (Analgesics):    diclofenac (VOLTAREN) 75 MG EC tablet, Take 1 tablet (75 mg total) by mouth 2 (two) times daily.   naproxen  (NAPROSYN) 375 MG tablet, Take 1 tablet (375 mg total) by mouth 2 (two) times daily.   Current Outpatient Medications (Other):    diclofenac Sodium (VOLTAREN) 1 % GEL, Apply 4 g topically 4 (four) times daily.   docusate sodium (COLACE) 100 MG capsule, Take 1 capsule (100 mg total) by mouth 2 (two) times daily.   nicotine (NICODERM CQ) 14 mg/24hr patch, Place 1 patch (14 mg total) onto the skin daily.  Review of Systems:  Review of system negative unless stated in the problem list or HPI.    Physical Exam:  Vitals:   08/14/22 1109  BP: (!) 160/77  Pulse: 81  Resp: (!) 24  Temp: 98.1 F (36.7 C)  TempSrc: Oral  SpO2: 100%  Height: '5\' 6"'$  (1.676 m)    Physical Exam General: NAD HENT: NCAT Lungs: CTAB, no wheeze, rhonchi or rales.  Cardiovascular: Normal heart sounds, no r/m/g, 2+ pulses in all extremities. No LE edema Abdomen: No TTP, normal bowel sounds MSK: No asymmetry or muscle atrophy.  Skin: no lesions noted on exposed skin Neuro: Alert and oriented x4. CN grossly intact Psych: Normal mood and normal affect   Assessment & Plan:   Acute viral sinusitis Pt with symptoms of sinus congestion, cough, and symptoms of post nasal drip consistent with acute viral sinusitis. Symptoms are mild. Advised pt to continue conservative measures. Pt states she wants to be tested so testing was performed and was negative. Suspect it is due to rhinovirus. Provided  pt with information on nasal rinses provided.    Hypertension Pt with uncontrolled HTN. Pt's antihypertensives are overdue for refill. Advised strict adherence and one week follow up for her HTN. Pt in agreement.    See Encounters Tab for problem based charting.  Patient Discussed with Dr. Roderic Ovens, MD Tillie Rung. [facility], PGY-2

## 2022-08-15 ENCOUNTER — Ambulatory Visit (HOSPITAL_COMMUNITY): Payer: BC Managed Care – PPO

## 2022-08-15 LAB — COVID-19, FLU A+B AND RSV
Influenza A, NAA: NOT DETECTED
Influenza B, NAA: NOT DETECTED
RSV, NAA: NOT DETECTED
SARS-CoV-2, NAA: NOT DETECTED

## 2022-08-16 DIAGNOSIS — B9789 Other viral agents as the cause of diseases classified elsewhere: Secondary | ICD-10-CM | POA: Insufficient documentation

## 2022-08-16 NOTE — Assessment & Plan Note (Signed)
Pt with symptoms of sinus congestion, cough, and symptoms of post nasal drip consistent with acute viral sinusitis. Symptoms are mild. Advised pt to continue conservative measures. Pt states she wants to be tested so testing was performed and was negative. Suspect it is due to rhinovirus. Provided pt with information on nasal rinses provided.

## 2022-08-16 NOTE — Assessment & Plan Note (Signed)
Pt with uncontrolled HTN. Pt's antihypertensives are overdue for refill. Advised strict adherence and one week follow up for her HTN. Pt in agreement.

## 2022-08-19 NOTE — Progress Notes (Signed)
Internal Medicine Clinic Attending  Case discussed with the resident at the time of the visit.  We reviewed the resident's history and exam and pertinent patient test results.  I agree with the assessment, diagnosis, and plan of care documented in the resident's note.  

## 2022-08-25 ENCOUNTER — Ambulatory Visit (HOSPITAL_COMMUNITY): Payer: BC Managed Care – PPO

## 2022-09-01 ENCOUNTER — Other Ambulatory Visit: Payer: Self-pay | Admitting: Family Medicine

## 2022-09-01 DIAGNOSIS — I10 Essential (primary) hypertension: Secondary | ICD-10-CM

## 2022-09-01 NOTE — Telephone Encounter (Signed)
Next appt scheduled tomorrow 3/20 with PCP.

## 2022-09-02 ENCOUNTER — Ambulatory Visit: Payer: BC Managed Care – PPO | Admitting: Podiatry

## 2022-09-02 ENCOUNTER — Encounter: Payer: BC Managed Care – PPO | Admitting: Student

## 2022-09-02 ENCOUNTER — Ambulatory Visit (INDEPENDENT_AMBULATORY_CARE_PROVIDER_SITE_OTHER): Payer: BC Managed Care – PPO | Admitting: Student

## 2022-09-02 VITALS — BP 124/53 | HR 76 | Temp 98.3°F | Ht 66.0 in | Wt 290.4 lb

## 2022-09-02 DIAGNOSIS — Z720 Tobacco use: Secondary | ICD-10-CM

## 2022-09-02 DIAGNOSIS — Z760 Encounter for issue of repeat prescription: Secondary | ICD-10-CM

## 2022-09-02 DIAGNOSIS — I10 Essential (primary) hypertension: Secondary | ICD-10-CM

## 2022-09-02 DIAGNOSIS — F17210 Nicotine dependence, cigarettes, uncomplicated: Secondary | ICD-10-CM

## 2022-09-02 DIAGNOSIS — E785 Hyperlipidemia, unspecified: Secondary | ICD-10-CM

## 2022-09-02 DIAGNOSIS — D649 Anemia, unspecified: Secondary | ICD-10-CM

## 2022-09-02 DIAGNOSIS — R400 Somnolence: Secondary | ICD-10-CM

## 2022-09-02 DIAGNOSIS — E66813 Obesity, class 3: Secondary | ICD-10-CM

## 2022-09-02 DIAGNOSIS — J449 Chronic obstructive pulmonary disease, unspecified: Secondary | ICD-10-CM

## 2022-09-02 DIAGNOSIS — M25572 Pain in left ankle and joints of left foot: Secondary | ICD-10-CM

## 2022-09-02 DIAGNOSIS — Z6841 Body Mass Index (BMI) 40.0 and over, adult: Secondary | ICD-10-CM

## 2022-09-02 MED ORDER — PRAVASTATIN SODIUM 20 MG PO TABS: 20.0000 mg | ORAL_TABLET | Freq: Every day | ORAL | 2 refills | Status: DC

## 2022-09-02 NOTE — Patient Instructions (Signed)
Please come back in a week for your blood pressure, we will show you how to use your BP cuff at that time.   Regarding your smoking, please start chantix now.

## 2022-09-02 NOTE — Progress Notes (Unsigned)
   CC: annual follow up   HPI:  [name] is a 62 y.o. F with PMH per below  LCV 3/1 flu like symptoms no COIVD or flu. These have resolved.    HTN:  Elevated last clinic visit. On verapamil due to previous mentioned headaches which are now well controlled. Her BP this clinic visit is well controlled. She will come back in 1-2 weeks with her BP cuff to ensure she is taking her BP correctly. She will record a log and we will decide in 1 month if she needs titration of her antihypertensive regimen.  Chlorthalidone 25mg  daily  Verapamil 120mg  qd    Tobacco use: has chantix prescription has not used this. Patient plans to set a stop date and start chantix. She cannot affordably get nicotine replacement because of her insurance. She is interested in CBT as well to help with this.    Past Medical History:  Diagnosis Date   Anemia    Bronchitis    Headache    Hemorrhoids    Hyperlipidemia    Hypertension    Menorrhagia    Review of Systems:  Negative except per above  Physical Exam:  Vitals:   09/02/22 1510  BP: (!) 124/53  Pulse: 76  Temp: 98.3 F (36.8 C)  TempSrc: Oral  SpO2: 98%  Weight: 290 lb 6.4 oz (131.7 kg)  Height: 5\' 6"  (1.676 m)   Physical Exam  Constitutional: Well-developed, well-nourished, and in no distress. .  Cardiovascular: Normal rate, regular rhythm, intact distal pulses. No gallop and no friction rub.  No murmur heard. No lower extremity edema  Pulmonary: Non labored breathing on room air, no wheezing or rales  Abdominal: Soft. Normal bowel sounds. Non distended and non tender Musculoskeletal: Normal range of motion.        General: No tenderness or edema.  Neurological: Alert and oriented to person, place, and time. Non focal  Skin: Skin is warm and dry.    Assessment & Plan:   See Encounters Tab for problem based charting.  Patient discussed with Dr. Daryll Drown

## 2022-09-03 ENCOUNTER — Ambulatory Visit
Admission: RE | Admit: 2022-09-03 | Discharge: 2022-09-03 | Disposition: A | Discharge status: Home / Self Care | Payer: BC Managed Care – PPO | Source: Ambulatory Visit

## 2022-09-03 DIAGNOSIS — E785 Hyperlipidemia, unspecified: Secondary | ICD-10-CM | POA: Insufficient documentation

## 2022-09-03 DIAGNOSIS — Z1231 Encounter for screening mammogram for malignant neoplasm of breast: Secondary | ICD-10-CM

## 2022-09-03 LAB — LIPID PANEL
Chol/HDL Ratio: 3.4 ratio (ref 0.0–4.4)
Cholesterol, Total: 198 mg/dL (ref 100–199)
HDL: 59 mg/dL (ref >39)
LDL Chol Calc (NIH): 114 mg/dL — ABNORMAL HIGH (ref 0–99)
Triglycerides: 144 mg/dL (ref 0–149)
VLDL Cholesterol Cal: 25 mg/dL (ref 5–40)

## 2022-09-03 LAB — CBC
Hematocrit: 37.2 % (ref 34.0–46.6)
Hemoglobin: 12 g/dL (ref 11.1–15.9)
MCH: 31.8 pg (ref 26.6–33.0)
MCHC: 32.3 g/dL (ref 31.5–35.7)
MCV: 99 fL — ABNORMAL HIGH (ref 79–97)
Platelets: 380 10*3/uL (ref 150–450)
RBC: 3.77 x10E6/uL (ref 3.77–5.28)
RDW: 12.1 % (ref 11.7–15.4)
WBC: 8.8 10*3/uL (ref 3.4–10.8)

## 2022-09-03 LAB — HEMOGLOBIN A1C
Est. average glucose Bld gHb Est-mCnc: 103 mg/dL
Hgb A1c MFr Bld: 5.2 % (ref 4.8–5.6)

## 2022-09-03 LAB — BMP8+ANION GAP
Anion Gap: 18 mmol/L (ref 10.0–18.0)
BUN/Creatinine Ratio: 30 — ABNORMAL HIGH (ref 12–28)
BUN: 21 mg/dL (ref 8–27)
CO2: 22 mmol/L (ref 20–29)
Calcium: 9.8 mg/dL (ref 8.7–10.3)
Chloride: 105 mmol/L (ref 96–106)
Creatinine, Ser: 0.69 mg/dL (ref 0.57–1.00)
Glucose: 103 mg/dL — ABNORMAL HIGH (ref 70–99)
Potassium: 4 mmol/L (ref 3.5–5.2)
Sodium: 145 mmol/L — ABNORMAL HIGH (ref 134–144)
eGFR: 97 mL/min/{1.73_m2} (ref >59)

## 2022-09-03 NOTE — Assessment & Plan Note (Signed)
Plan to start chantix. Referred to [facility] for CBT.

## 2022-09-03 NOTE — Assessment & Plan Note (Signed)
No formal diagnosis. Sparingly uses her albuterol inhaler. Continue to hold on PFTs. She is committed to stopping smoking.

## 2022-09-03 NOTE — Assessment & Plan Note (Addendum)
Patient states she continues to have daytime sleepiness. She also notes snoring. She also has elevated BP. She is interested in sleep study referral for this.

## 2022-09-03 NOTE — Assessment & Plan Note (Signed)
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 Mild Achilles peritendinitis. Minimal retrocalcaneal bursal fluid.   Tenosynovitis of the peroneal tendons above and below the lateral malleolus. No evidence of peroneal tendon tear.   Chronic sprain/partial tear of the anterior talofibular ligament.   Mild tibiotalar, posterior subtalar, talonavicular, and calcaneocuboid osteoarthritis.  She sees podiatry and notes that her pain is markedly improved. She will continue to follow with podiatry.

## 2022-09-03 NOTE — Assessment & Plan Note (Addendum)
Checked BMET this clinic visit. Kidney function is normal. Will continue chlorthalidone and verapamil. She will come to clinic in 1 week with her BP cuff. She will take her Bps for 1 month and we will decide at that time if he antihypertensive regimen should be further titrated. Also referring for sleep study.

## 2022-09-03 NOTE — Assessment & Plan Note (Signed)
ASCVD risk score is 14%. Will discuss starting statin at next clinic visit.

## 2022-09-07 ENCOUNTER — Ambulatory Visit: Payer: BC Managed Care – PPO | Admitting: Podiatry

## 2022-09-09 ENCOUNTER — Encounter: Payer: Self-pay | Admitting: Podiatry

## 2022-09-09 ENCOUNTER — Ambulatory Visit (INDEPENDENT_AMBULATORY_CARE_PROVIDER_SITE_OTHER): Payer: BC Managed Care – PPO | Admitting: Podiatry

## 2022-09-09 DIAGNOSIS — M7672 Peroneal tendinitis, left leg: Secondary | ICD-10-CM

## 2022-09-09 DIAGNOSIS — M7752 Other enthesopathy of left foot: Secondary | ICD-10-CM

## 2022-09-09 NOTE — Progress Notes (Signed)
Subjective:   Patient ID: Allison Mcintyre, female   DOB: 62 y.o.   MRN: [id number]   HPI Patient states her ankle seems to be getting better but still has pain and is wearing her boot at work    ROS      Objective:  Physical Exam  Neurovascular status intact with patient's left peroneal found to be still moderately inflamed and improved with previous MRI only showing inflammation no indications of tear     Assessment:  Peroneal tendinitis left that is improving with immobilization but still present     Plan:  Advised on continuation of immobilization at this time ice therapy patient to be seen back and all questions answered today

## 2022-09-10 ENCOUNTER — Telehealth: Payer: Self-pay | Admitting: Podiatry

## 2022-09-10 ENCOUNTER — Encounter: Payer: Self-pay | Admitting: Podiatry

## 2022-09-10 NOTE — Telephone Encounter (Signed)
That is fine 

## 2022-09-10 NOTE — Telephone Encounter (Signed)
Pt called and is wanting a note for work today as she is staying out due to her ankle pain. Is it ok for me to write a note? She would like to pick up today

## 2022-09-14 ENCOUNTER — Encounter: Payer: Self-pay | Admitting: Podiatry

## 2022-09-14 ENCOUNTER — Telehealth: Payer: Self-pay | Admitting: Podiatry

## 2022-09-14 NOTE — Telephone Encounter (Signed)
That's fine

## 2022-09-14 NOTE — Telephone Encounter (Signed)
Pt wants to know if her return to work letter vcan be changed to return to work tomorrow 4/2 instead of today 4/81.  Please advise

## 2022-09-15 ENCOUNTER — Encounter: Payer: Self-pay | Admitting: Podiatry

## 2022-09-15 NOTE — Progress Notes (Signed)
Internal Medicine Clinic Attending  Case discussed with Dr. Carter  at the time of the visit.  We reviewed the resident's history and exam and pertinent patient test results.  I agree with the assessment, diagnosis, and plan of care documented in the resident's note.  

## 2022-09-16 ENCOUNTER — Encounter: Payer: BC Managed Care – PPO | Admitting: Student

## 2022-09-22 ENCOUNTER — Ambulatory Visit (INDEPENDENT_AMBULATORY_CARE_PROVIDER_SITE_OTHER): Payer: Self-pay | Admitting: Licensed Clinical Social Worker

## 2022-09-22 ENCOUNTER — Other Ambulatory Visit: Payer: Self-pay | Admitting: Podiatry

## 2022-09-22 DIAGNOSIS — I10 Essential (primary) hypertension: Secondary | ICD-10-CM

## 2022-09-22 NOTE — BH Specialist Note (Signed)
Integrated Behavioral Health via Telemedicine Visit  09/22/2022 Allison Mcintyre [id number]  Number of Integrated Behavioral Health Clinician visits: 1- Initial Visit  Session Start time: 1400   Session End time: 1430  Total time in minutes: 30   Referring Provider: Adaline Sill, MD Patient/Family location: Home [facility] Provider location: Office All persons participating in visit: [facility] and Patient Types of Service: Introduction only  I connected with Allison Mcintyre Newborn via  Telephone or Video Enabled Telemedicine Application  (Video is Caregility application) and verified that I am speaking with the correct person using two identifiers. Discussed confidentiality: Yes   I discussed the limitations of telemedicine and the availability of in person appointments.  Discussed there is a possibility of technology failure and discussed alternative modes of communication if that failure occurs.  I discussed that engaging in this telemedicine visit, they consent to the provision of behavioral healthcare and the services will be billed under their insurance.  Patient and/or legal guardian expressed understanding and consented to Telemedicine visit: Yes   Assessment: Patient denied needing services on today. [name] discussed services, confidentiality and patient stated she understood. Patient stated she is doing fine. [facility] gave patient direct contact information and advised patient to contact [facility] or [facility] if anything changes. Patient stated she understood. No additional follow up requested.  I discussed the assessment and treatment plan with the patient and/or parent/guardian. They were provided an opportunity to ask questions and all were answered. They agreed with the plan and demonstrated an understanding of the instructions.   They were advised to call back or seek an in-person evaluation if the symptoms worsen or if the condition fails to improve as anticipated.  Christen Butter, MSW,  LCSW-A She/Her Behavioral Health Clinician [facility]  Internal Medicine Center Direct [phone]  Fax [phone] Main Office Phone: [phone] [address] [place] [place], [place] [address] Website: [facility] Internal Medicine [facility]  Miguel Barrera, [place]  [place]

## 2022-09-24 ENCOUNTER — Encounter: Payer: BC Managed Care – PPO | Admitting: Student

## 2022-10-06 ENCOUNTER — Ambulatory Visit: Payer: BC Managed Care – PPO | Admitting: Student

## 2022-10-06 ENCOUNTER — Encounter: Payer: Self-pay | Admitting: Student

## 2022-10-06 VITALS — BP 145/64 | HR 74 | Temp 98.8°F | Ht 66.0 in | Wt 282.5 lb

## 2022-10-06 DIAGNOSIS — I10 Essential (primary) hypertension: Secondary | ICD-10-CM | POA: Diagnosis not present

## 2022-10-06 DIAGNOSIS — E785 Hyperlipidemia, unspecified: Secondary | ICD-10-CM

## 2022-10-06 DIAGNOSIS — Z760 Encounter for issue of repeat prescription: Secondary | ICD-10-CM

## 2022-10-06 DIAGNOSIS — I872 Venous insufficiency (chronic) (peripheral): Secondary | ICD-10-CM | POA: Diagnosis not present

## 2022-10-06 DIAGNOSIS — Z720 Tobacco use: Secondary | ICD-10-CM

## 2022-10-06 DIAGNOSIS — F17210 Nicotine dependence, cigarettes, uncomplicated: Secondary | ICD-10-CM

## 2022-10-06 DIAGNOSIS — Z789 Other specified health status: Secondary | ICD-10-CM

## 2022-10-06 MED ORDER — DOCUSATE SODIUM 100 MG PO CAPS: 100.0000 mg | ORAL_CAPSULE | Freq: Two times a day (BID) | ORAL | 0 refills | Status: AC

## 2022-10-06 MED ORDER — LOSARTAN POTASSIUM-HCTZ 50-12.5 MG PO TABS: 1.0000 | ORAL_TABLET | Freq: Every day | ORAL | 3 refills | Status: DC

## 2022-10-06 MED ORDER — PRAVASTATIN SODIUM 40 MG PO TABS
40.0000 mg | ORAL_TABLET | Freq: Every day | ORAL | 3 refills | Status: DC
Start: 2022-10-06 — End: 2023-07-06

## 2022-10-06 MED ORDER — FLUTICASONE PROPIONATE 50 MCG/ACT NA SUSP: 2.0000 | Freq: Every day | NASAL | 0 refills | Status: DC

## 2022-10-06 NOTE — Assessment & Plan Note (Signed)
10 year ASCVD event risk 19.6%. Increase pravastatin today for this person with several cardiovascular disease risk factors. - Increase pravastatin to 40 mg daily

## 2022-10-06 NOTE — Patient Instructions (Signed)
Today we talked about high blood pressure and high cholesterol.  This after visit summary is an important review of tests, referrals, and medication changes that were discussed during your visit. If you have questions or concerns, call [phone]. Outside of clinic business hours, call the main hospital at [phone] and ask the operator for the on-call internal medicine resident.   Ernesta Amble MD 10/06/2022, 3:14 PM

## 2022-10-06 NOTE — Progress Notes (Signed)
    Subjective:  [name] is a 62 y.o. who presents to clinic for the following:  Follow up for high blood pressure, high cholesterol.  Overall she feels well. She has some chronic problems like ankle pain and leg swelling that are stable. She is tired from a long shift at work and feels stressed out. Unable to bring medicine and BP cuff since she came straight from work.  Review of Systems  Respiratory:  Negative for cough, shortness of breath and wheezing.   Cardiovascular:  Positive for leg swelling. Negative for chest pain, orthopnea, claudication and PND.   Objective:   Vitals:   10/06/22 1432 10/06/22 1502  BP: (!) 143/72 (!) 145/64  Pulse: 74   Temp: 98.8 F (37.1 C)   TempSrc: Oral   SpO2: 97%   Weight: 282 lb 8 oz (128.1 kg)   Height:  (1.676 m)     Physical Exam No apparent distress Heart rate normal, rhythm regular, no appreciable murmurs, radial pulses strong, bilateral lower extremity edema Breathing is regular and unlabored on room air, no wheezing or crackles Skin is warm and dry, no redness, warmth, erythema of calves No calf tenderness Alert and oriented Irritable and frustrated, affect concordant  Assessment & Plan:   Hypertension BP 145/64 on repeat today. No home results, but suspect that control not achieved on current regimen. Will start losartan-HCTZ with long term plan to include decreasing verapamil since she still suffers leg swelling, presumably from calcium channel blocker. - losartan-HCTZ 50-12.5 mg daily - discontinue chlorthalidone - return in 2 weeks for nursing visit and BP check  Hyperlipidemia 10 year ASCVD event risk 19.6%. Increase pravastatin today for this person with several cardiovascular disease risk factors. - Increase pravastatin to 40 mg daily  Tobacco use Still smoking 4-5 cigarettes per day. Nicotine patches don't work for her. Was prescribed Chantix at last visit but hasn't started this medicine  yet.  Alcohol use She reports drinking 32-64 oz of beer per day. This was brought up just prior to ending visit. At follow-up should assess risk for alcohol use disorder. Advised patient to decrease intake by half between now and then.  Venous (peripheral) insufficiency No signs or symptoms concerning for PAD or VTE. For leg swelling, recommend compression stockings.    Return in 2 weeks, for BP check (NURSE ONLY VISIT).  Patient discussed with Dr. Carmela Hurt MD 10/06/2022, 3:39 PM  Pager: [phone]

## 2022-10-06 NOTE — Assessment & Plan Note (Signed)
Still smoking 4-5 cigarettes per day. Nicotine patches don't work for her. Was prescribed Chantix at last visit but hasn't started this medicine yet.

## 2022-10-06 NOTE — Assessment & Plan Note (Signed)
No signs or symptoms concerning for PAD or VTE. For leg swelling, recommend compression stockings.

## 2022-10-06 NOTE — Assessment & Plan Note (Signed)
She reports drinking 32-64 oz of beer per day. This was brought up just prior to ending visit. At follow-up should assess risk for alcohol use disorder. Advised patient to decrease intake by half between now and then.

## 2022-10-06 NOTE — Assessment & Plan Note (Signed)
BP 145/64 on repeat today. No home results, but suspect that control not achieved on current regimen. Will start losartan-HCTZ with long term plan to include decreasing verapamil since she still suffers leg swelling, presumably from calcium channel blocker. - losartan-HCTZ 50-12.5 mg daily - discontinue chlorthalidone - return in 2 weeks for nursing visit and BP check

## 2022-10-07 ENCOUNTER — Telehealth: Payer: Self-pay | Admitting: *Deleted

## 2022-10-07 ENCOUNTER — Ambulatory Visit: Payer: BC Managed Care – PPO | Admitting: Podiatry

## 2022-10-07 NOTE — Telephone Encounter (Unsigned)
Call from patient needs note to return to work on tomorrow.  Was out today after visit here yesterday. States was started  on a new B/P medication and is on a Cholesterol medication. Was not sure how she would feel with the new medications. Would like for letter to be placed in E-Chart. Patient was informed hat a message would be sent to Dr. Benito Mccreedy who saw her on yesterday.

## 2022-10-09 ENCOUNTER — Telehealth: Payer: Self-pay | Admitting: *Deleted

## 2022-10-09 NOTE — Telephone Encounter (Signed)
Received call from pt who's calling about her partner, Allison Mcintyre who is a pt here also. She stated [name] needs a mental health referral. She stated he will not let her open the curtains/blinds -he thinks she's talking to the neighbors; she stated she cannot live like this. I told her he needs to schedule an appt to be evaluated. She stated she has an appt on 5/7 and if possible,can he be scheduled on the same day. Call transferred to front office. Appt schedule w/[name] 5/7 @ 1445PM.

## 2022-10-11 NOTE — Progress Notes (Signed)
Internal Medicine Clinic Attending  Case discussed with Dr. McLendon  At the time of the visit.  We reviewed the resident's history and exam and pertinent patient test results.  I agree with the assessment, diagnosis, and plan of care documented in the resident's note.  

## 2022-10-13 ENCOUNTER — Encounter: Payer: Self-pay | Admitting: Internal Medicine

## 2022-10-13 ENCOUNTER — Ambulatory Visit (HOSPITAL_COMMUNITY)
Admission: RE | Admit: 2022-10-13 | Discharge: 2022-10-13 | Disposition: A | Discharge status: Home / Self Care | Payer: BC Managed Care – PPO | Source: Ambulatory Visit | Attending: Internal Medicine | Admitting: Internal Medicine

## 2022-10-13 ENCOUNTER — Ambulatory Visit: Payer: BC Managed Care – PPO | Admitting: Internal Medicine

## 2022-10-13 VITALS — BP 128/57 | HR 83 | Ht 66.0 in | Wt 279.0 lb

## 2022-10-13 DIAGNOSIS — R079 Chest pain, unspecified: Secondary | ICD-10-CM

## 2022-10-13 DIAGNOSIS — G629 Polyneuropathy, unspecified: Secondary | ICD-10-CM | POA: Diagnosis not present

## 2022-10-13 DIAGNOSIS — R220 Localized swelling, mass and lump, head: Secondary | ICD-10-CM

## 2022-10-13 DIAGNOSIS — Z1329 Encounter for screening for other suspected endocrine disorder: Secondary | ICD-10-CM

## 2022-10-13 DIAGNOSIS — I10 Essential (primary) hypertension: Secondary | ICD-10-CM

## 2022-10-13 DIAGNOSIS — Z720 Tobacco use: Secondary | ICD-10-CM | POA: Insufficient documentation

## 2022-10-13 DIAGNOSIS — Z789 Other specified health status: Secondary | ICD-10-CM

## 2022-10-13 DIAGNOSIS — F1090 Alcohol use, unspecified, uncomplicated: Secondary | ICD-10-CM

## 2022-10-13 DIAGNOSIS — F17210 Nicotine dependence, cigarettes, uncomplicated: Secondary | ICD-10-CM

## 2022-10-13 NOTE — Progress Notes (Signed)
   CC: swelling, tingling  HPI:[name] is a 62 y.o. female who presents for evaluation of swelling, tingling. Please see individual problem based A/P for details.  61 yof htn, etoh use presents with complaints of facial swelling, tingling in fingers and toes.  Depression, PHQ-9: Based on the patients  Flowsheet Row Office Visit from 02/26/2022 in [facility]  PHQ-9 Total Score 3      score we have .  Past Medical History:  Diagnosis Date   Anemia    Bronchitis    Headache    Hemorrhoids    Hyperlipidemia    Hypertension    Menorrhagia    Review of Systems:   See hpi  Physical Exam: Vitals:   10/13/22 1456  BP: (!) 128/57  Pulse: 83  SpO2: 94%  Weight: 279 lb (126.6 kg)  Height: 5\' 6"  (1.676 m)   General: nad HEENT: Conjunctiva nl , antiicteric sclerae, moist mucous membranes, no exudate or erythema Cardiovascular: Normal rate, regular rhythm.  No murmurs, rubs, or gallops Pulmonary : Equal breath sounds, No wheezes, rales, or rhonchi Abdominal: soft, nontender,  bowel sounds present Ext: No edema in lower extremities, no tenderness to palpation of lower extremities.   Assessment & Plan:   See Encounters Tab for problem based charting.  Chest pain Patient reported episode of substernal chest burning earlier this morning. She said she felt like she was about to faint. Didn't take anything to help. She did sit down which relieved it. Asymptomatic now. Does say she had more severe episode several months prior but never got it evaluated.  Resting comfortably on exam, RRR, clear lungs, skin warm and dry, well perfused with symmetric radial pulses. NAD She does have cardiac risk factors including tobacco use. Given story and possibility for atypical presentation, EKG was checked and reassuring normal. Asymptomatic at this time too so no further work up currently. Intermediate risk so we will refer her to  cardiology.   Neuropathy Complains of bilateral tingling in all fingertips both hands. Describes occasional stinging too. Strength and coordination are intact per pt. Also complains of tingling in feet. Not impacting her much on day-today basis.  Hx consistent with a bilateral symmetric peripheral neuropathy.  TSH, B12, folate were checked and normal. If symptoms persist, could consider iron panel too. She has hx of etoh use which may be contributing.  Facial swelling Patient complains of asymetric swelling in face, predominantly around left cheek. Says this has been intermittently occurring past 2 weeks. Also notes some puffiness around eyes. She recently started ARB, but says symptoms predate this medicines and she vehemently declines changing this.  On exam, I am not able to appreciate any edema, asymmetry, erythema, or induration. Speech is clear, no signs of angioedema, and airway is clear.  I am not able to identify any reversible cause at this time. I don't think her ARB is causing her any issues currently. Ideally patient would be evaluated during episode of swelling.   Hypertension Compliant with combination medicine. Swelling has improved some too. No changes to regimen.    Patient seen with Dr. Cleda Daub

## 2022-10-13 NOTE — Patient Instructions (Addendum)
Dear Allison Mcintyre,  We discussed your swelling, tingling, and chest pain.   We will check some labs for the tingling. For the chest pain, we will check an EKG and refer you to a cardiologist.   Please return in 3 months for check up.

## 2022-10-14 NOTE — Progress Notes (Signed)
Folate and b12 wnl. Do not explain her bilateral symmetric finger tingling. TSH still pending.

## 2022-10-15 ENCOUNTER — Encounter: Payer: Self-pay | Admitting: Internal Medicine

## 2022-10-15 ENCOUNTER — Ambulatory Visit: Payer: BC Managed Care – PPO | Attending: Internal Medicine | Admitting: Internal Medicine

## 2022-10-15 VITALS — BP 136/72 | HR 76 | Ht 66.0 in | Wt 268.0 lb

## 2022-10-15 DIAGNOSIS — R079 Chest pain, unspecified: Secondary | ICD-10-CM

## 2022-10-15 LAB — TSH: TSH: 0.99 u[IU]/mL (ref 0.450–4.500)

## 2022-10-15 LAB — FOLATE: Folate: 12.7 ng/mL (ref >3.0)

## 2022-10-15 LAB — VITAMIN B12: Vitamin B-12: 656 pg/mL (ref 232–1245)

## 2022-10-15 NOTE — Progress Notes (Signed)
Cardiology Office Note:    Date:  10/15/2022   ID:  Allison Mcintyre, DOB 09-Jan-1961, MRN [id number]  PCP:  Marolyn Haller, MD   [facility] Providers Cardiologist:  Maisie Fus, MD     Referring MD: Marolyn Haller, MD   No chief complaint on file. Chest pain  History of Present Illness:    Allison Mcintyre is a 62 y.o. female with a hx of smoking, A1c 5.2%,referral for CP, note is incomplete. She notes her chest was hurting "the other day".  Notes central CP. She noted it for a few hours. Notes tingling in her wrist and shoulder. No clear trigger.  No orthopnea or PND Smoking since she was 65 years old Sister has a "leaking valve:  Saw Dr. Eden Emms in 2016. She had dependent edema. Noted some palpitations. EKG was unremarkable.  Echo showed normal LV function/RV fxn. No significant valve dx.  Past Medical History:  Diagnosis Date   Anemia    Bronchitis    Headache    Hemorrhoids    Hyperlipidemia    Hypertension    Menorrhagia     Past Surgical History:  Procedure Laterality Date   ABDOMINAL HYSTERECTOMY  2003   CESAREAN SECTION     CHOLECYSTECTOMY N/A 01/19/2022   Procedure: LAPAROSCOPIC CHOLECYSTECTOMY WITH INTRAOPERATIVE CHOLANGIOGRAM;  Surgeon: Fritzi Mandes, MD;  Location: [facility];  Service: General;  Laterality: N/A;   COLONOSCOPY     ERCP N/A 01/20/2022   Procedure: ENDOSCOPIC RETROGRADE CHOLANGIOPANCREATOGRAPHY (ERCP);  Surgeon: Iva Boop, MD;  Location: [facility];  Service: Gastroenterology;  Laterality: N/A;   POLYPECTOMY     REMOVAL OF STONES  01/20/2022   Procedure: REMOVAL OF STONES;  Surgeon: Iva Boop, MD;  Location: [facility];  Service: Gastroenterology;;   Dennison Mascot  01/20/2022   Procedure: Dennison Mascot;  Surgeon: Iva Boop, MD;  Location: [facility];  Service: Gastroenterology;;    Current Medications: Current Meds  Medication Sig   albuterol (VENTOLIN HFA) 108 (90 Base) MCG/ACT inhaler Inhale 1-2  puffs into the lungs every 6 (six) hours as needed for wheezing or shortness of breath.   diclofenac (VOLTAREN) 75 MG EC tablet TAKE 1 TABLET (75 MG TOTAL) BY MOUTH 2 (TWO) TIMES DAILY.   docusate sodium (COLACE) 100 MG capsule Take 1 capsule (100 mg total) by mouth 2 (two) times daily.   fluticasone (FLONASE) 50 MCG/ACT nasal spray Place 2 sprays into both nostrils daily.   losartan-hydrochlorothiazide (HYZAAR) 50-12.5 MG tablet Take 1 tablet by mouth daily.   pravastatin (PRAVACHOL) 40 MG tablet Take 1 tablet (40 mg total) by mouth daily.   predniSONE (STERAPRED UNI-PAK 48 TAB) 10 MG (48) TBPK tablet Take 10 mg by mouth See admin instructions.   verapamil (CALAN-SR) 120 MG CR tablet TAKE 1 TABLET (120 MG TOTAL) BY MOUTH DAILY.     Allergies:   Patient has no known allergies.   Social History   Socioeconomic History   Marital status: Single    Spouse name: Not on file   Number of children: Not on file   Years of education: Not on file   Highest education level: Not on file  Occupational History   Not on file  Tobacco Use   Smoking status: Every Day    Packs/day: .25    Types: Cigarettes   Smokeless tobacco: Never   Tobacco comments:    5 per day  Vaping Use   Vaping Use: Never used  Substance  and Sexual Activity   Alcohol use: Yes    Alcohol/week: 3.0 standard drinks of alcohol    Types: 3 Cans of beer per week    Comment: occasional , about weekly   Drug use: No   Sexual activity: Yes    Birth control/protection: Surgical  Other Topics Concern   Not on file  Social History Narrative   Not on file   Social Determinants of Health   Financial Resource Strain: Low Risk  (04/28/2022)   Overall Financial Resource Strain (CARDIA)    Difficulty of Paying Living Expenses: Not hard at all  Food Insecurity: Food Insecurity Present (04/28/2022)   Hunger Vital Sign    Worried About Running Out of Food in the Last Year: Never true    Ran Out of Food in the Last Year: Sometimes  true  Transportation Needs: No Transportation Needs (04/28/2022)   PRAPARE - Administrator, Civil Service (Medical): No    Lack of Transportation (Non-Medical): No  Physical Activity: Sufficiently Active (04/28/2022)   Exercise Vital Sign    Days of Exercise per Week: 5 days    Minutes of Exercise per Session: 60 min  Stress: No Stress Concern Present (04/28/2022)   [employer] of Occupational Health - Occupational Stress Questionnaire    Feeling of Stress : Only a little  Social Connections: Moderately Isolated (04/28/2022)   Social Connection and Isolation Panel [NHANES]    Frequency of Communication with Friends and Family: More than three times a week    Frequency of Social Gatherings with Friends and Family: Never    Attends Religious Services: Never    Database administrator or Organizations: No    Attends Engineer, structural: Never    Marital Status: Living with partner     Family History: The patient's family history includes Bronchitis in her father; Cancer in her mother; Pancreatic cancer in her mother. There is no history of Colon polyps, Esophageal cancer, Stomach cancer, Rectal cancer, Colon cancer, or Breast cancer.  ROS:   Please see the history of present illness.     All other systems reviewed and are negative.  EKGs/Labs/Other Studies Reviewed:    The following studies were reviewed today:   EKG:  EKG is  ordered today.  The ekg ordered today demonstrates   10/15/2022- NSR  Recent Labs: 01/21/2022: ALT 34 09/02/2022: BUN 21; Creatinine, Ser 0.69; Hemoglobin 12.0; Platelets 380; Potassium 4.0; Sodium 145 10/13/2022: TSH 0.990   Recent Lipid Panel    Component Value Date/Time   CHOL 198 09/02/2022 1622   TRIG 144 09/02/2022 1622   HDL 59 09/02/2022 1622   CHOLHDL 3.4 09/02/2022 1622   CHOLHDL 3.7 12/11/2013 1153   VLDL 29 12/11/2013 1153   LDLCALC 114 (H) 09/02/2022 1622     Risk Assessment/Calculations:     Physical  Exam:    VS:   Vitals:   10/15/22 1452  BP: 136/72  Pulse: 76  SpO2: 94%    Wt Readings from Last 3 Encounters:  10/15/22 268 lb (121.6 kg)  10/13/22 279 lb (126.6 kg)  10/06/22 282 lb 8 oz (128.1 kg)     GEN:  Well nourished, well developed in no acute distress HEENT: Normal NECK: No JVD; No carotid bruits CARDIAC: RRR, no murmurs, rubs, gallops RESPIRATORY:  Clear to auscultation without rales, wheezing or rhonchi  ABDOMEN: Soft, non-tender, non-distended MUSCULOSKELETAL:  No edema; No deformity  SKIN: Warm and dry NEUROLOGIC:  Alert and  oriented x 3 PSYCHIATRIC:  Normal affect   ASSESSMENT:    CP: Gathering the hx is challenging. With risk of smoking and chest pressure will obtain an exercise stress to help risk stratify her. Otherwise recommend smoking cessation. PLAN:    In order of problems listed above:  Exercise NM SPECT  Follow up PRN      Shared Decision Making/Informed Consent The risks [chest pain, shortness of breath, cardiac arrhythmias, dizziness, blood pressure fluctuations, myocardial infarction, stroke/transient ischemic attack, nausea, vomiting, allergic reaction, radiation exposure, metallic taste sensation and life-threatening complications (estimated to be 1 in 10,000)], benefits (risk stratification, diagnosing coronary artery disease, treatment guidance) and alternatives of a nuclear stress test were discussed in detail with Ms. Michiels and she agrees to proceed.    Medication Adjustments/Labs and Tests Ordered: Current medicines are reviewed at length with the patient today.  Concerns regarding medicines are outlined above.  Orders Placed This Encounter  Procedures   Cardiac Stress Test: Informed Consent Details: Physician/Practitioner Attestation; Transcribe to consent form and obtain patient signature   MYOCARDIAL PERFUSION IMAGING   EKG 12-Lead   No orders of the defined types were placed in this encounter.   Patient Instructions   Medication Instructions:  No Changes In Medications at this time.   *If you need a refill on your cardiac medications before your next appointment, please call your pharmacy*  Lab Work: None Ordered At This Time.   If you have labs (blood work) drawn today and your tests are completely normal, you will receive your results only by: MyChart Message (if you have MyChart) OR A paper copy in the mail If you have any lab test that is abnormal or we need to change your treatment, we will call you to review the results.  Testing/Procedures: Your physician has requested that you have en exercise stress myoview. For further information please visit [email or web address] Please follow instruction sheet, as given. This will take place at [address]  The test will take approximately 3 to 4 hours to complete; you may bring reading material.  If someone comes with you to your appointment, they will need to remain in the main lobby due to limited space in the testing area.    How to prepare for your Myocardial Perfusion Test: Do not eat or drink 3 hours prior to your test, except you may have water. Do not consume products containing caffeine (regular or decaffeinated) 12 hours prior to your test. (ex: coffee, chocolate, sodas, tea). Do wear comfortable clothes (no dresses or overalls) and walking shoes, tennis shoes preferred (No heels or open toe shoes are allowed). Do NOT wear cologne, perfume, aftershave, or lotions (deodorant is allowed). If you use an inhaler, use it the AM of your test and bring it with you.  If you use a nebulizer, use it the AM of your test.  If these instructions are not followed, your test will have to be rescheduled.  Follow-Up: At [facility], you and your health needs are our priority.  As part of our continuing mission to provide you with exceptional heart care, we have created designated Provider Care Teams.  These Care Teams include your  primary Cardiologist (physician) and Advanced Practice Providers (APPs -  Physician Assistants and Nurse Practitioners) who all work together to provide you with the care you need, when you need it.  Your next appointment:   AS NEEDED   Provider:   Maisie Fus, MD  Signed, Maisie Fus, MD  10/15/2022 3:09 PM    [facility]

## 2022-10-15 NOTE — Patient Instructions (Signed)
Medication Instructions:  No Changes In Medications at this time.   *If you need a refill on your cardiac medications before your next appointment, please call your pharmacy*  Lab Work: None Ordered At This Time.   If you have labs (blood work) drawn today and your tests are completely normal, you will receive your results only by: MyChart Message (if you have MyChart) OR A paper copy in the mail If you have any lab test that is abnormal or we need to change your treatment, we will call you to review the results.  Testing/Procedures: Your physician has requested that you have en exercise stress myoview. For further information please visit [email or web address] Please follow instruction sheet, as given. This will take place at [address]  The test will take approximately 3 to 4 hours to complete; you may bring reading material.  If someone comes with you to your appointment, they will need to remain in the main lobby due to limited space in the testing area.    How to prepare for your Myocardial Perfusion Test: Do not eat or drink 3 hours prior to your test, except you may have water. Do not consume products containing caffeine (regular or decaffeinated) 12 hours prior to your test. (ex: coffee, chocolate, sodas, tea). Do wear comfortable clothes (no dresses or overalls) and walking shoes, tennis shoes preferred (No heels or open toe shoes are allowed). Do NOT wear cologne, perfume, aftershave, or lotions (deodorant is allowed). If you use an inhaler, use it the AM of your test and bring it with you.  If you use a nebulizer, use it the AM of your test.  If these instructions are not followed, your test will have to be rescheduled.  Follow-Up: At [facility], you and your health needs are our priority.  As part of our continuing mission to provide you with exceptional heart care, we have created designated Provider Care Teams.  These Care Teams include your  primary Cardiologist (physician) and Advanced Practice Providers (APPs -  Physician Assistants and Nurse Practitioners) who all work together to provide you with the care you need, when you need it.  Your next appointment:   AS NEEDED   Provider:   Maisie Fus, MD

## 2022-10-16 ENCOUNTER — Encounter: Payer: Self-pay | Admitting: Internal Medicine

## 2022-10-16 DIAGNOSIS — R220 Localized swelling, mass and lump, head: Secondary | ICD-10-CM | POA: Insufficient documentation

## 2022-10-16 DIAGNOSIS — G629 Polyneuropathy, unspecified: Secondary | ICD-10-CM | POA: Insufficient documentation

## 2022-10-16 DIAGNOSIS — R079 Chest pain, unspecified: Secondary | ICD-10-CM | POA: Insufficient documentation

## 2022-10-16 DIAGNOSIS — R0789 Other chest pain: Secondary | ICD-10-CM | POA: Insufficient documentation

## 2022-10-16 NOTE — Assessment & Plan Note (Signed)
Patient reported episode of substernal chest burning earlier this morning. She said she felt like she was about to faint. Didn't take anything to help. She did sit down which relieved it. Asymptomatic now. Does say she had more severe episode several months prior but never got it evaluated.  Resting comfortably on exam, RRR, clear lungs, skin warm and dry, well perfused with symmetric radial pulses. NAD She does have cardiac risk factors including tobacco use. Given story and possibility for atypical presentation, EKG was checked and reassuring normal. Asymptomatic at this time too so no further work up currently. Intermediate risk so we will refer her to cardiology.

## 2022-10-16 NOTE — Assessment & Plan Note (Signed)
Patient complains of asymetric swelling in face, predominantly around left cheek. Says this has been intermittently occurring past 2 weeks. Also notes some puffiness around eyes. She recently started ARB, but says symptoms predate this medicines and she vehemently declines changing this.  On exam, I am not able to appreciate any edema, asymmetry, erythema, or induration. Speech is clear, no signs of angioedema, and airway is clear.  I am not able to identify any reversible cause at this time. I don't think her ARB is causing her any issues currently. Ideally patient would be evaluated during episode of swelling.

## 2022-10-16 NOTE — Assessment & Plan Note (Signed)
Compliant with combination medicine. Swelling has improved some too. No changes to regimen.

## 2022-10-16 NOTE — Progress Notes (Signed)
Internal Medicine Clinic Attending  Case discussed with the resident at the time of the visit.  We reviewed the resident's history and exam and pertinent patient test results.  I agree with the assessment, diagnosis, and plan of care documented in the resident's note.  

## 2022-10-16 NOTE — Assessment & Plan Note (Addendum)
Complains of bilateral tingling in all fingertips both hands. Describes occasional stinging too. Strength and coordination are intact per pt. Also complains of tingling in feet. Not impacting her much on day-today basis.  Hx consistent with a bilateral symmetric peripheral neuropathy.  TSH, B12, folate were checked and normal. If symptoms persist, could consider iron panel too. She has hx of etoh use which may be contributing.

## 2022-10-19 ENCOUNTER — Telehealth: Payer: Self-pay | Admitting: Cardiovascular Disease

## 2022-10-19 NOTE — Telephone Encounter (Signed)
Attempted to lvm informing pt of upcoming appts. Vm not working. 10-19-22, J.Britt

## 2022-10-20 ENCOUNTER — Encounter: Payer: BC Managed Care – PPO | Admitting: Student

## 2022-10-20 NOTE — Progress Notes (Deleted)
10/13/2022 Chest pain-refer to cardiology after reassuring EKG, exercise nuclear medicine Neuropathy-alcohol induced? HLD-LDL 114, ASCVD risk 17.5%, pravastatin increased to 40 last month HTN-losartan/HCTZ, verapamil? Dose?

## 2022-10-26 ENCOUNTER — Institutional Professional Consult (permissible substitution): Payer: BC Managed Care – PPO | Admitting: Neurology

## 2022-10-28 ENCOUNTER — Encounter (HOSPITAL_COMMUNITY): Payer: BC Managed Care – PPO

## 2022-10-28 ENCOUNTER — Ambulatory Visit (HOSPITAL_COMMUNITY): Payer: BC Managed Care – PPO

## 2022-10-30 ENCOUNTER — Telehealth (HOSPITAL_COMMUNITY): Payer: Self-pay | Admitting: *Deleted

## 2022-10-30 NOTE — Telephone Encounter (Signed)
Left message on voicemail per DPR in reference to upcoming appointment scheduled on 11/02/22 at 1:30 with detailed instructions given per Myocardial Perfusion Study Information Sheet for the test. LM to arrive 15 minutes early, and that it is imperative to arrive on time for appointment to keep from having the test rescheduled. If you need to cancel or reschedule your appointment, please call the office within 24 hours of your appointment. Failure to do so may result in a cancellation of your appointment, and a $50 no show fee. Phone number given for call back for any questions.

## 2022-11-02 ENCOUNTER — Ambulatory Visit (HOSPITAL_COMMUNITY): Payer: BC Managed Care – PPO

## 2022-11-03 ENCOUNTER — Ambulatory Visit (HOSPITAL_COMMUNITY): Payer: BC Managed Care – PPO

## 2022-11-12 ENCOUNTER — Telehealth (HOSPITAL_COMMUNITY): Payer: Self-pay | Admitting: *Deleted

## 2022-11-12 NOTE — Telephone Encounter (Signed)
Patient given detailed instructions per Myocardial Perfusion Study Information Sheet for the test on 11/16/2022 at 11:00. Patient notified to arrive 15 minutes early and that it is imperative to arrive on time for appointment to keep from having the test rescheduled.  If you need to cancel or reschedule your appointment, please call the office within 24 hours of your appointment. . Patient verbalized understanding.Daneil Dolin

## 2022-11-16 ENCOUNTER — Encounter: Payer: Self-pay | Admitting: *Deleted

## 2022-11-16 ENCOUNTER — Ambulatory Visit (HOSPITAL_COMMUNITY): Payer: BC Managed Care – PPO

## 2022-11-17 ENCOUNTER — Ambulatory Visit (HOSPITAL_COMMUNITY): Payer: BC Managed Care – PPO

## 2022-11-23 ENCOUNTER — Other Ambulatory Visit: Payer: Self-pay

## 2022-11-23 ENCOUNTER — Ambulatory Visit (HOSPITAL_COMMUNITY)
Admission: RE | Admit: 2022-11-23 | Discharge: 2022-11-23 | Disposition: A | Discharge status: Home / Self Care | Payer: BC Managed Care – PPO | Source: Ambulatory Visit | Attending: Family Medicine | Admitting: Family Medicine

## 2022-11-23 ENCOUNTER — Encounter (HOSPITAL_COMMUNITY): Payer: Self-pay

## 2022-11-23 VITALS — BP 148/75 | HR 74 | Temp 98.6°F | Resp 22

## 2022-11-23 DIAGNOSIS — J019 Acute sinusitis, unspecified: Secondary | ICD-10-CM | POA: Diagnosis not present

## 2022-11-23 DIAGNOSIS — J209 Acute bronchitis, unspecified: Secondary | ICD-10-CM

## 2022-11-23 MED ORDER — ALBUTEROL SULFATE HFA 108 (90 BASE) MCG/ACT IN AERS: 2.0000 | INHALATION_SPRAY | Freq: Four times a day (QID) | RESPIRATORY_TRACT | 0 refills | Status: DC | PRN

## 2022-11-23 MED ORDER — AMOXICILLIN-POT CLAVULANATE 875-125 MG PO TABS: 1.0000 | ORAL_TABLET | Freq: Two times a day (BID) | ORAL | 0 refills | Status: DC

## 2022-11-23 MED ORDER — PREDNISONE 20 MG PO TABS: 20.0000 mg | ORAL_TABLET | Freq: Every day | ORAL | 0 refills | Status: AC

## 2022-11-23 MED ORDER — PROMETHAZINE-DM 6.25-15 MG/5ML PO SYRP: 5.0000 mL | ORAL_SOLUTION | Freq: Three times a day (TID) | ORAL | 0 refills | Status: DC | PRN

## 2022-11-23 NOTE — ED Triage Notes (Addendum)
C/O productive cough with yellowish green sputum x 1 wk; states initially was running fevers, but those have resolved. C/O nasal congestion and chest soreness and "rattling" in chest. Has been taking honey, Dayquil, OTC cold meds.
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
## 2022-11-26 ENCOUNTER — Institutional Professional Consult (permissible substitution): Payer: BC Managed Care – PPO | Admitting: Neurology

## 2022-12-01 ENCOUNTER — Ambulatory Visit (HOSPITAL_COMMUNITY): Payer: BC Managed Care – PPO

## 2022-12-02 ENCOUNTER — Ambulatory Visit (INDEPENDENT_AMBULATORY_CARE_PROVIDER_SITE_OTHER): Payer: BC Managed Care – PPO

## 2022-12-02 ENCOUNTER — Ambulatory Visit (HOSPITAL_COMMUNITY)
Admission: RE | Admit: 2022-12-02 | Discharge: 2022-12-02 | Disposition: A | Discharge status: Home / Self Care | Payer: BC Managed Care – PPO | Source: Ambulatory Visit | Attending: Physician Assistant | Admitting: Physician Assistant

## 2022-12-02 ENCOUNTER — Encounter (HOSPITAL_COMMUNITY): Payer: Self-pay

## 2022-12-02 ENCOUNTER — Other Ambulatory Visit: Payer: Self-pay

## 2022-12-02 VITALS — BP 149/85 | HR 66 | Temp 98.3°F | Resp 18 | Ht 66.0 in | Wt 269.0 lb

## 2022-12-02 DIAGNOSIS — J441 Chronic obstructive pulmonary disease with (acute) exacerbation: Secondary | ICD-10-CM | POA: Diagnosis present

## 2022-12-02 DIAGNOSIS — Z1152 Encounter for screening for COVID-19: Secondary | ICD-10-CM | POA: Diagnosis not present

## 2022-12-02 LAB — SARS CORONAVIRUS 2 (TAT 6-24 HRS): SARS Coronavirus 2: NEGATIVE

## 2022-12-02 MED ORDER — AZITHROMYCIN 250 MG PO TABS: 250.0000 mg | ORAL_TABLET | Freq: Every day | ORAL | 0 refills | Status: DC

## 2022-12-02 MED ORDER — SPIRIVA RESPIMAT 2.5 MCG/ACT IN AERS: 2.0000 | INHALATION_SPRAY | Freq: Every day | RESPIRATORY_TRACT | 1 refills | Status: DC

## 2022-12-02 MED ORDER — PREDNISONE 20 MG PO TABS: 40.0000 mg | ORAL_TABLET | Freq: Every day | ORAL | 0 refills | Status: AC

## 2022-12-02 NOTE — Discharge Instructions (Addendum)
Your x-ray did not show any evidence of pneumonia.  There was some scarring in your lung but otherwise this looked okay.  We will contact you if you are positive for COVID.  Start prednisone 40 mg for 4 days.  Do not take NSAIDs with this medication including aspirin, ibuprofen/Advil, naproxen/Aleve.  Start azithromycin as prescribed.  Continue using albuterol every 4-6 hours as needed.  I have also sent in a another medication called Spiriva that I would like you to use daily.  Follow-up with your primary care within a week if your symptoms have not improved.  I also recommend over-the-counter allergy medication such as fluticasone and an allergy pill such as cetirizine.  Use nasal saline and sinus rinses.  If anything worsens and you have high fever, worsening cough, shortness of breath, nausea, vomiting you need to be seen immediately.
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
## 2022-12-02 NOTE — ED Triage Notes (Signed)
Pt states she continues to have URI symptoms for the past 19 days and want to be check for Covid.

## 2022-12-22 ENCOUNTER — Telehealth: Payer: Self-pay | Admitting: *Deleted

## 2022-12-22 NOTE — Telephone Encounter (Signed)
Call from patient stating new medications are making her have back and sides and hips.

## 2022-12-22 NOTE — Telephone Encounter (Signed)
Patient reporting pain with recently prescribed blood pressure and cholesterol medicine.  Through review of chart I suspect that she is talking about the pravastatin, which was increased at her last visit, and the losartan/hydrochlorothiazide combo which was started at her last visit.  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  Today she did not take either 1 of those medicines and felt much better.  I recommended that she restart her blood pressure medicine but hold her pravastatin tomorrow.  If the pain returns she can try holding her blood pressure medicine and taking the pravastatin.  I suspect that it might be due to pravastatin, we may consider switching to something like atorvastatin which is better tolerated from a myalgia standpoint.  If she still has significant pain or it is keeping her from taking her medicine she should make an in person appointment for physical exam and medication changes.

## 2022-12-24 NOTE — Telephone Encounter (Signed)
Pt  is requesting a call back she stated that it is her  bp med  that is causing  her back pain

## 2022-12-25 NOTE — Telephone Encounter (Signed)
Called left a message for pt to call the office  back to make and appt with  PCP  sometime this month

## 2023-01-07 ENCOUNTER — Encounter: Payer: BC Managed Care – PPO | Admitting: Student

## 2023-01-18 ENCOUNTER — Ambulatory Visit (HOSPITAL_COMMUNITY): Payer: BC Managed Care – PPO

## 2023-01-18 ENCOUNTER — Ambulatory Visit: Payer: BC Managed Care – PPO | Admitting: Student

## 2023-01-18 VITALS — BP 147/73 | HR 64 | Wt 282.9 lb

## 2023-01-18 DIAGNOSIS — I159 Secondary hypertension, unspecified: Secondary | ICD-10-CM | POA: Diagnosis not present

## 2023-01-18 DIAGNOSIS — I10 Essential (primary) hypertension: Secondary | ICD-10-CM

## 2023-01-18 DIAGNOSIS — L739 Follicular disorder, unspecified: Secondary | ICD-10-CM | POA: Diagnosis not present

## 2023-01-18 DIAGNOSIS — Z789 Other specified health status: Secondary | ICD-10-CM

## 2023-01-18 DIAGNOSIS — R432 Parageusia: Secondary | ICD-10-CM

## 2023-01-18 DIAGNOSIS — R238 Other skin changes: Secondary | ICD-10-CM | POA: Diagnosis not present

## 2023-01-18 DIAGNOSIS — F1090 Alcohol use, unspecified, uncomplicated: Secondary | ICD-10-CM

## 2023-01-18 DIAGNOSIS — J449 Chronic obstructive pulmonary disease, unspecified: Secondary | ICD-10-CM | POA: Diagnosis not present

## 2023-01-18 DIAGNOSIS — Z1329 Encounter for screening for other suspected endocrine disorder: Secondary | ICD-10-CM

## 2023-01-18 MED ORDER — PROMETHAZINE-DM 6.25-15 MG/5ML PO SYRP: 5.0000 mL | ORAL_SOLUTION | Freq: Three times a day (TID) | ORAL | 0 refills | Status: DC | PRN

## 2023-01-18 NOTE — Patient Instructions (Addendum)
Thank you, Allison Mcintyre for allowing [place] to provide your care today. Today we discussed changes to your taste and smell, as well as the skin bump on your abdomen, and your smoking.    I have ordered the following labs for you:   Lab Orders         Basic metabolic panel         CBC no Diff         Vitamin B12         TSH      Tests ordered today:  No other tests ordered today.   Referrals ordered today:   Referral Orders  No referral(s) requested today     I have ordered the following medication/changed the following medications:   Stop the following medications: Medications Discontinued During This Encounter  Medication Reason   promethazine-dextromethorphan (PROMETHAZINE-DM) 6.25-15 MG/5ML syrup Reorder     Start the following medications: Meds ordered this encounter  Medications   promethazine-dextromethorphan (PROMETHAZINE-DM) 6.25-15 MG/5ML syrup    Sig: Take 5 mLs by mouth 3 (three) times daily as needed for cough.    Dispense:  240 mL    Refill:  0     Follow up:   - Please follow up with [place] in 1 month to check your blood pressure and discuss possible smoking cessation, thank you! If you can bring your medications with you that would be very helpful!   Remember:   - No infection on your skin today. The small skin bump may be a hair follicle that is clogged. Washing that area of your abdomen, where skin touches skin, with soap and water, and keeping it dry, is important to keep you from developing those skin bumps.   - Your blood pressures today were 160/67 and the repeat blood pressure was 147/73. We will see you in clinic in 1 month to follow up with your blood pressure.   - It is difficult to say whether your reduced taste and smell have to do with a previous COVID infection that may not have been picked up on testing. Your smoking may also be causing reduced taste and smell. We can follow up on smoking cessation at your next visit.   - I will call  you to go over your lab results when they are back.   Should you have any questions or concerns please call the internal medicine clinic at [phone].      Colbert Coyer, MD PGY-1 Internal Medicine Teaching Progam [facility]

## 2023-01-18 NOTE — Assessment & Plan Note (Signed)
Patient with potential folliculitis on abdomen where skin folds

## 2023-01-18 NOTE — Progress Notes (Signed)
Established Patient Office Visit  Subjective   Patient ID: Allison Mcintyre, female    DOB: March 09, 1961  Age: 62 y.o. MRN: [id number]  Chief Complaint  Patient presents with   Skin Problem    Bump right lower abdomen-noticed about 2wks ago No drainage, no pain   Back Pain    Right lower abd/back pain Pain 10/10 at times      Patient is a 62 y.o. with a past medical history stated below who presents today for follow-up for right lower quadrant skin lesion, 21-month history of altered taste/smell, and BP check. Please see problem based assessment and plan for additional details.    Back Pain Pertinent negatives include no abdominal pain, chest pain, fever or headaches.    Past Medical History:  Diagnosis Date   Anemia    Bronchitis    Headache    Hemorrhoids    Hyperlipidemia    Hypertension    Menorrhagia       Review of Systems  Constitutional:  Negative for chills and fever.  Respiratory:  Positive for cough. Negative for shortness of breath.   Cardiovascular:  Positive for leg swelling. Negative for chest pain and palpitations.  Gastrointestinal:  Negative for abdominal pain, nausea and vomiting.  Musculoskeletal:  Positive for back pain.  Skin:  Negative for itching and rash.  Neurological:  Negative for dizziness and headaches.      Objective:     BP (!) 147/73 (BP Location: Left Wrist, Patient Position: Sitting, Cuff Size: Normal)   Pulse 64   Wt 282 lb 14.4 oz (128.3 kg)   SpO2 99%   BMI 45.66 kg/m  BP Readings from Last 3 Encounters:  01/18/23 (!) 147/73  12/02/22 (!) 149/85  11/23/22 (!) 148/75   Wt Readings from Last 3 Encounters:  01/18/23 282 lb 14.4 oz (128.3 kg)  12/02/22 268 lb 15.4 oz (122 kg)  10/15/22 268 lb (121.6 kg)      Physical Exam Constitutional:      General: She is not in acute distress.    Appearance: She is not ill-appearing.  HENT:     Head: Normocephalic and atraumatic.     Nose: No congestion or rhinorrhea.      Mouth/Throat:     Mouth: Mucous membranes are moist.     Pharynx: No oropharyngeal exudate or posterior oropharyngeal erythema.  Eyes:     Extraocular Movements: Extraocular movements intact.     Pupils: Pupils are equal, round, and reactive to light.  Cardiovascular:     Rate and Rhythm: Normal rate and regular rhythm.  Pulmonary:     Effort: Pulmonary effort is normal.     Breath sounds: Normal breath sounds.  Abdominal:     General: Bowel sounds are normal.     Palpations: Abdomen is soft.     Comments: Right lower quadrant skin changes on abdomen that include hyperpigmentation, especially along skin folds, and dispersed papules. Area where patient pointed to for focal pain is possibly an inflamed papule/hair follicle. Skin moist. No skin sensation changes.   Musculoskeletal:        General: Swelling present.     Comments: Baseline bilateral swelling on both ankles.   Skin:    General: Skin is warm and dry.  Neurological:     Mental Status: She is alert.  Psychiatric:        Mood and Affect: Mood normal.        Behavior: Behavior normal.  No results found for any visits on 01/18/23.  Last metabolic panel Lab Results  Component Value Date   GLUCOSE 103 (H) 09/02/2022   NA 145 (H) 09/02/2022   K 4.0 09/02/2022   CL 105 09/02/2022   CO2 22 09/02/2022   BUN 21 09/02/2022   CREATININE 0.69 09/02/2022   EGFR 97 09/02/2022   CALCIUM 9.8 09/02/2022   PROT 6.4 (L) 01/21/2022   ALBUMIN 3.2 (L) 01/21/2022   LABGLOB 3.2 11/28/2021   AGRATIO 1.4 11/28/2021   BILITOT 0.6 01/21/2022   ALKPHOS 71 01/21/2022   AST 22 01/21/2022   ALT 34 01/21/2022   ANIONGAP 6 01/21/2022   The 10-year ASCVD risk score (Arnett DK, et al., 2019) is: 21%    Assessment & Plan:   Problem List Items Addressed This Visit       Cardiovascular and Mediastinum   HTN (hypertension)    Patient's blood pressures elevated today. First BP 160/67 and repeat 147/73. Patient reports taking BP  medications as prescribed and stated she was not interested in addressing BP today or making any medication changes. HTN medications include verapamil 120 daily and losartan-hydrochlorothiazide 50-12.5 daily. Patient with baseline bilateral ankle edema on exam today. Previous clinic notes indicate long term plan to decrease verapamil dose to help with edema. Discussed importance of logging BP with patient and bringing medications to clinic appointments to make sure patient knows what the medications are for and that they are being taken correctly.  Plan: - Follow-up visit in 1 month to monitor blood pressures, asked patient to bring in medications - BMP lab today, will follow up as needed      Relevant Orders   Basic metabolic panel     Respiratory   COPD (chronic obstructive pulmonary disease) (HCC)    Patient reports improved chronic cough with promethazine syrup. Requested a refill today. Will address smoking cessation at follow up visit in 1 month.       Relevant Medications   promethazine-dextromethorphan (PROMETHAZINE-DM) 6.25-15 MG/5ML syrup     Musculoskeletal and Integument   Papule of skin    Patient with small papule on right lower abdomen. Papule is not pus filled, may be due to friction between skin folds. Skin in that area moist on exam. May also be a clogged hair follicle. Surrounding skin is hyperpigmented but not erythematous. Papule tender to touch, no fluid collection or cyst palpated on exam.  Plan:  - Monitor, patient instructed to wash with regular soap and keep the area between the skin folds dry.        RESOLVED: Folliculitis    Patient with potential folliculitis on abdomen where skin folds         Other   Taste sense altered - Primary    Patient reports about 2 months of reduced taste and smell. Denies any sick contacts. Was seen in the ED on 6/10 for sinusitis and on 6/19 for URI. Patient tested negative for respiratory panel, including COVID, at that time.  Patient does not recall being diagnosed with COVID or any sick contacts. Today she denies any sneezing, nasal discharge, fever, chills, N/V. Does endorse chronic cough related to COPD. Patient continues to smoke and is around other smokers. Counseled on the contribution of smoking to symptoms. Discussed with patient that taste may return over time, especially if caused by past COVID infection that was never picked up on testing, and that it is hard to predict when she will get back to her  baseline. No need for COVID or respiratory panel today.  Plan:  - Patient open to discussing smoking cessation at next visit - Continue to monitor changes in taste/smell      Relevant Orders   CBC no Diff   Vitamin B12   TSH    Return in about 4 weeks (around 02/15/2023) for BP check and smoking cessation if patient is interested.  Patient seen with Dr. Sol Blazing.    Colbert Coyer, MD

## 2023-01-18 NOTE — Assessment & Plan Note (Signed)
Patient reports improved chronic cough with promethazine syrup. Requested a refill today. Will address smoking cessation at follow up visit in 1 month.

## 2023-01-18 NOTE — Assessment & Plan Note (Signed)
Patient with small papule on right lower abdomen. Papule is not pus filled, may be due to friction between skin folds. Skin in that area moist on exam. May also be a clogged hair follicle. Surrounding skin is hyperpigmented but not erythematous. Papule tender to touch, no fluid collection or cyst palpated on exam.  Plan:  - Monitor, patient instructed to wash with regular soap and keep the area between the skin folds dry.

## 2023-01-18 NOTE — Assessment & Plan Note (Signed)
Patient's blood pressures elevated today. First BP 160/67 and repeat 147/73. Patient reports taking BP medications as prescribed and stated she was not interested in addressing BP today or making any medication changes. HTN medications include verapamil 120 daily and losartan-hydrochlorothiazide 50-12.5 daily. Patient with baseline bilateral ankle edema on exam today. Previous clinic notes indicate long term plan to decrease verapamil dose to help with edema. Discussed importance of logging BP with patient and bringing medications to clinic appointments to make sure patient knows what the medications are for and that they are being taken correctly.  Plan: - Follow-up visit in 1 month to monitor blood pressures, asked patient to bring in medications - BMP lab today, will follow up as needed

## 2023-01-18 NOTE — Assessment & Plan Note (Signed)
Patient reports about 2 months of reduced taste and smell. Denies any sick contacts. Was seen in the ED on 6/10 for sinusitis and on 6/19 for URI. Patient tested negative for respiratory panel, including COVID, at that time. Patient does not recall being diagnosed with COVID or any sick contacts. Today she denies any sneezing, nasal discharge, fever, chills, N/V. Does endorse chronic cough related to COPD. Patient continues to smoke and is around other smokers. Counseled on the contribution of smoking to symptoms. Discussed with patient that taste may return over time, especially if caused by past COVID infection that was never picked up on testing, and that it is hard to predict when she will get back to her baseline. No need for COVID or respiratory panel today.  Plan:  - Patient open to discussing smoking cessation at next visit - Continue to monitor changes in taste/smell

## 2023-01-19 ENCOUNTER — Telehealth: Payer: Self-pay

## 2023-01-19 ENCOUNTER — Ambulatory Visit: Payer: BC Managed Care – PPO | Admitting: Cardiovascular Disease

## 2023-01-19 NOTE — Telephone Encounter (Signed)
Requesting lab results, please call pt back.  

## 2023-01-21 NOTE — Progress Notes (Signed)
Internal Medicine Clinic Attending  I saw and evaluated the patient.  I personally confirmed the key portions of the history and exam documented by Dr. Colbert Coyer and I reviewed pertinent patient test results.  The assessment, diagnosis, and plan were formulated together and I agree with the documentation in the resident's note.

## 2023-01-28 ENCOUNTER — Encounter: Payer: BC Managed Care – PPO | Admitting: Student

## 2023-02-11 ENCOUNTER — Institutional Professional Consult (permissible substitution): Payer: BC Managed Care – PPO | Admitting: Neurology

## 2023-02-22 ENCOUNTER — Encounter: Payer: BC Managed Care – PPO | Admitting: Student

## 2023-03-03 ENCOUNTER — Telehealth: Payer: Self-pay

## 2023-03-03 NOTE — Telephone Encounter (Addendum)
Pt is requesting her Naproxen  375 MG TAB  .Marland Kitchen She stated that her foot is hurting her really bad ....   She doe have and upcoming appt with Dr Ned Card  for foot pain 10/2      Pharmacy  is  [facility]  that is on file

## 2023-03-09 ENCOUNTER — Other Ambulatory Visit: Payer: Self-pay | Admitting: Student

## 2023-03-09 DIAGNOSIS — G8929 Other chronic pain: Secondary | ICD-10-CM

## 2023-03-09 MED ORDER — DICLOFENAC SODIUM 75 MG PO TBEC: 75.0000 mg | DELAYED_RELEASE_TABLET | Freq: Two times a day (BID) | ORAL | 0 refills | Status: DC

## 2023-03-09 NOTE — Progress Notes (Signed)
Refilled diclofenac for foot pain, per this patient's request. Recommend follow-up at next in-person clinic visit.

## 2023-03-15 ENCOUNTER — Institutional Professional Consult (permissible substitution): Payer: BC Managed Care – PPO | Admitting: Neurology

## 2023-03-17 ENCOUNTER — Encounter: Payer: BC Managed Care – PPO | Admitting: Internal Medicine
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
## 2023-04-02 IMAGING — MG MM DIGITAL SCREENING BILAT W/ TOMO AND CAD
6 of 12 series · 6 of 36 positions shown · non-contrast
Comparison: Previous exam(s).

CLINICAL DATA: Screening.

EXAM:
DIGITAL SCREENING BILATERAL MAMMOGRAM WITH TOMOSYNTHESIS AND CAD
TECHNIQUE: Bilateral screening digital craniocaudal and mediolateral oblique
mammograms were obtained. Bilateral screening digital breast
tomosynthesis was performed. The images were evaluated with
computer-aided detection.

[R CC synth-2D (1 of 2)]
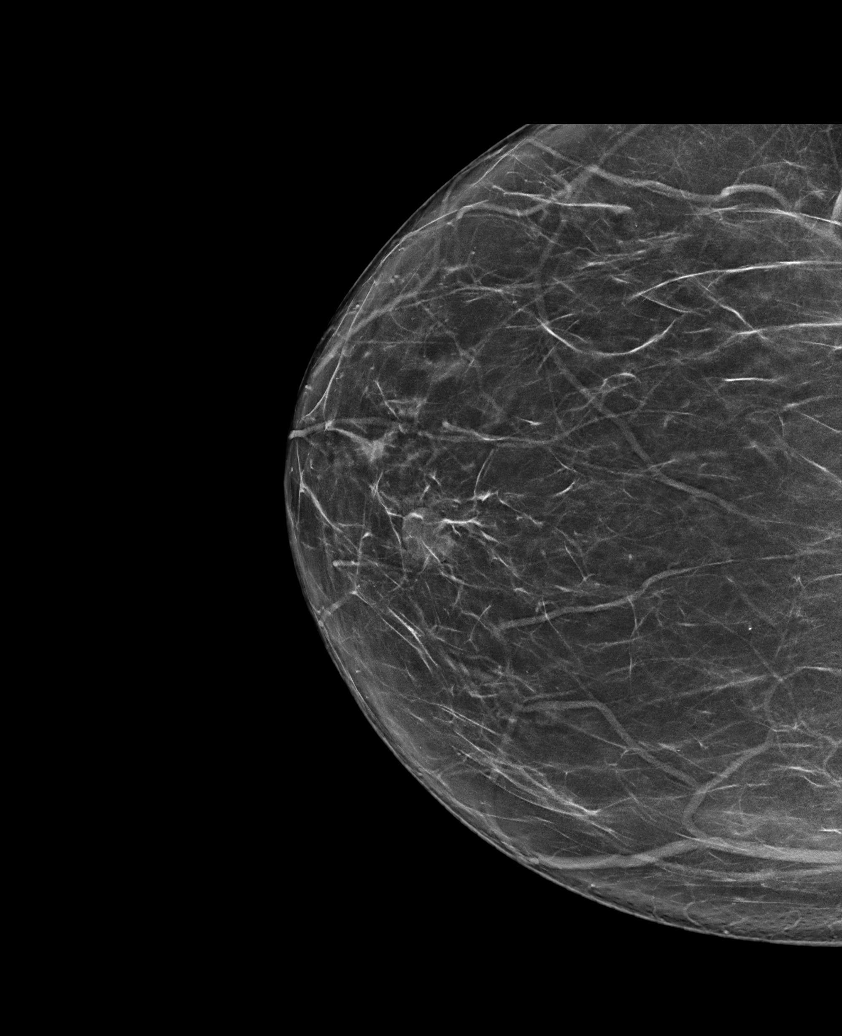

[L CC synth-2D (1 of 2)]
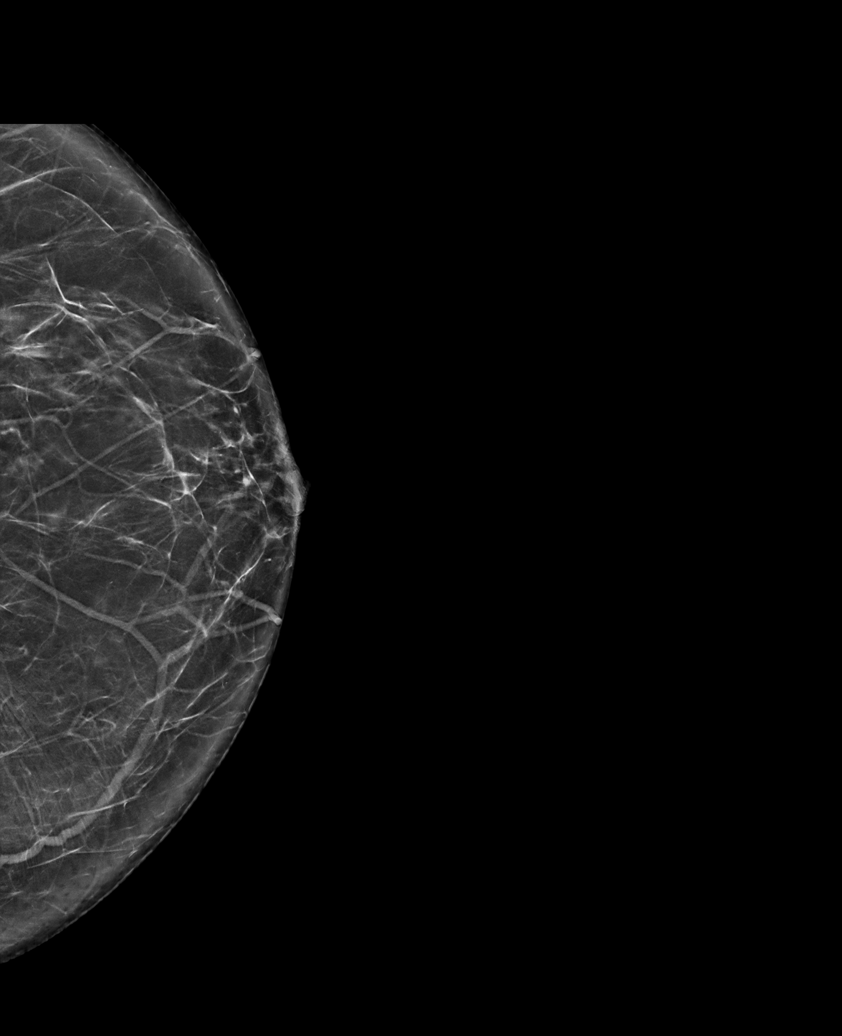

[L MLO synth-2D]
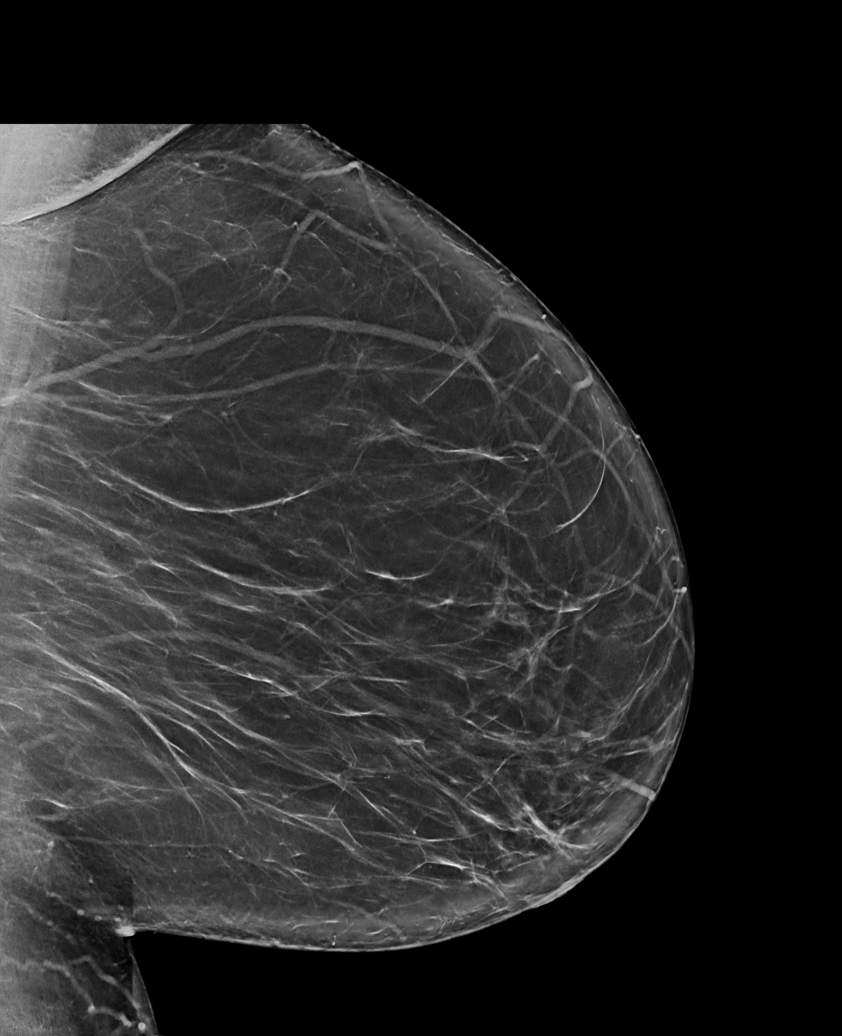

[R MLO synth-2D]
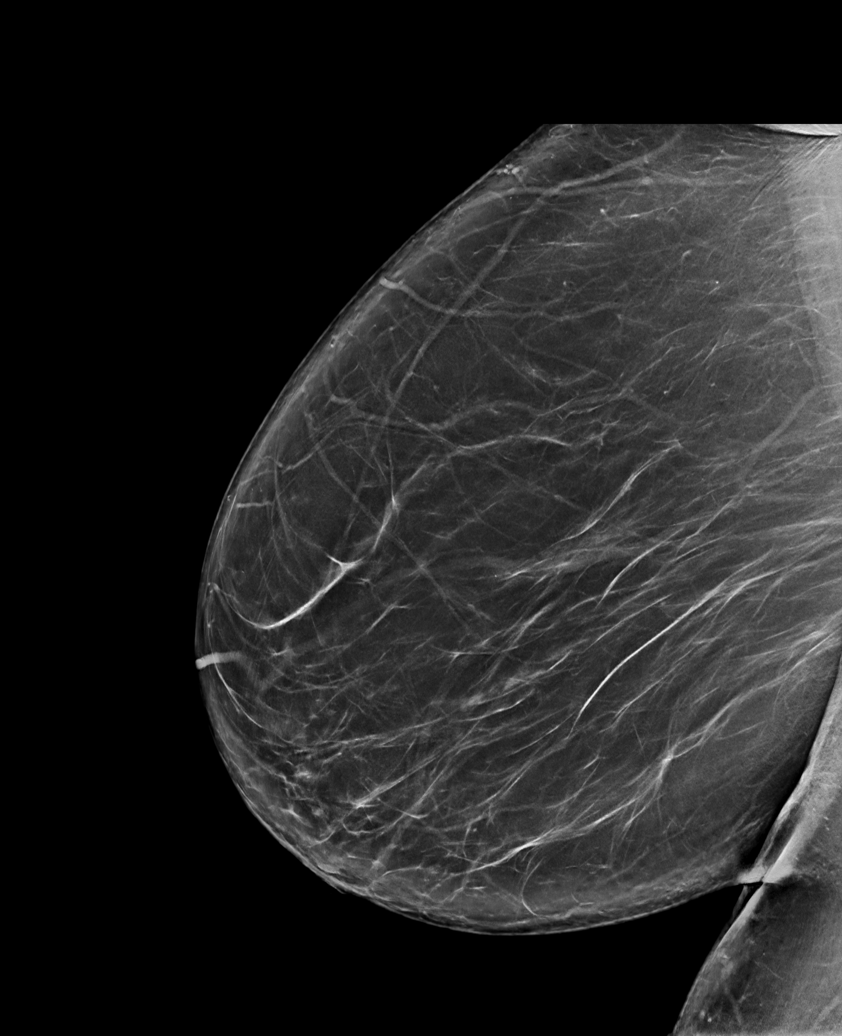

[L CC synth-2D (2 of 2)]
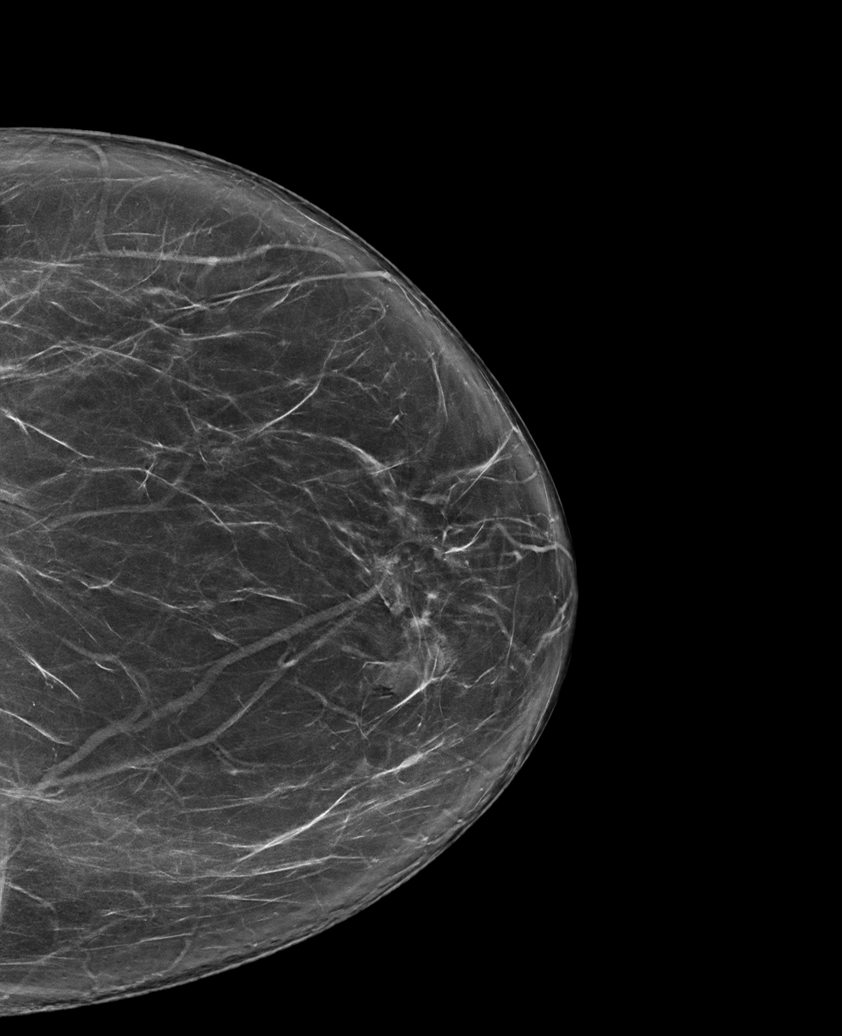

[R CC synth-2D (2 of 2)]
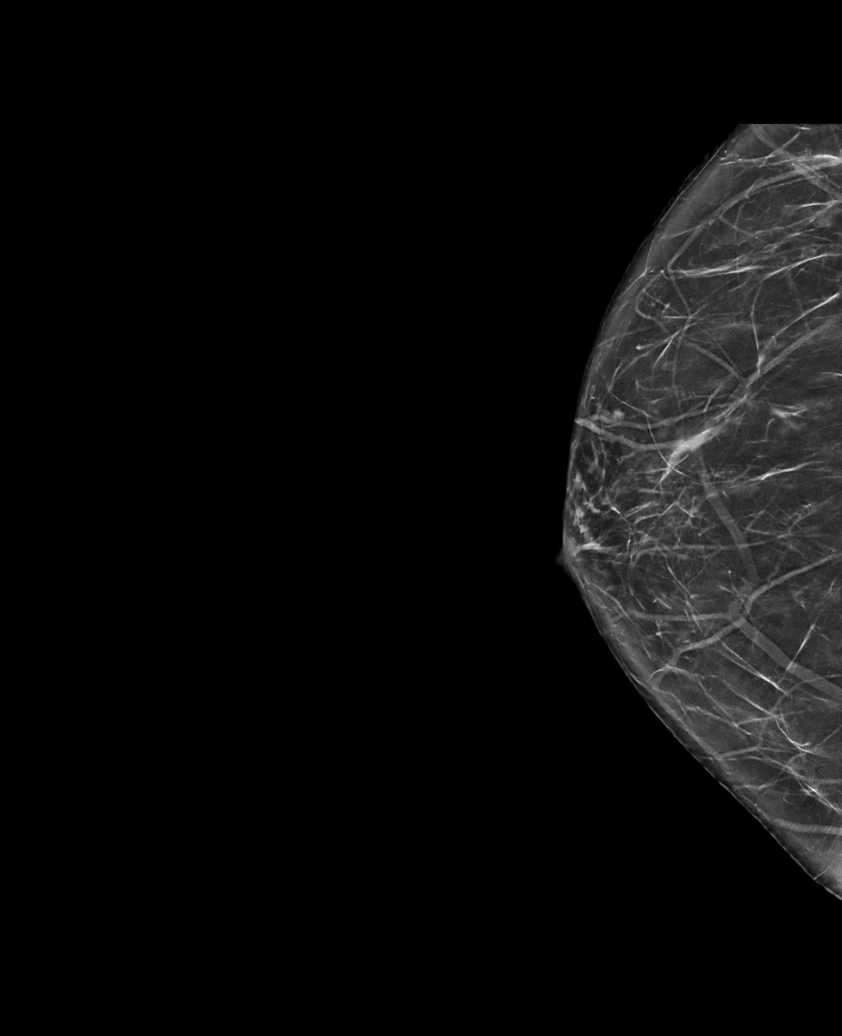

[6 of 36 positions shown; findings below may reference images not displayed]

ACR Breast Density Category b: There are scattered areas of
fibroglandular density.
FINDINGS: There are no findings suspicious for malignancy.
IMPRESSION: No mammographic evidence of malignancy. A result letter of this
screening mammogram will be mailed directly to the patient.

RECOMMENDATION:
Screening mammogram in one year. (Code:51-O-LD2)

BI-RADS CATEGORY  1: Negative.

## 2023-04-05 ENCOUNTER — Institutional Professional Consult (permissible substitution): Payer: BC Managed Care – PPO | Admitting: Neurology

## 2023-04-19 ENCOUNTER — Institutional Professional Consult (permissible substitution): Payer: BC Managed Care – PPO | Admitting: Neurology

## 2023-04-26 ENCOUNTER — Encounter: Payer: BC Managed Care – PPO | Admitting: Student

## 2023-05-10 ENCOUNTER — Encounter: Payer: BC Managed Care – PPO | Admitting: Student

## 2023-06-23 ENCOUNTER — Other Ambulatory Visit: Payer: Self-pay

## 2023-06-24 MED ORDER — ALBUTEROL SULFATE HFA 108 (90 BASE) MCG/ACT IN AERS: 2.0000 | INHALATION_SPRAY | Freq: Four times a day (QID) | RESPIRATORY_TRACT | 0 refills | Status: AC | PRN

## 2023-06-24 MED ORDER — SPIRIVA RESPIMAT 2.5 MCG/ACT IN AERS: 2.0000 | INHALATION_SPRAY | Freq: Every day | RESPIRATORY_TRACT | 1 refills | Status: DC

## 2023-06-24 MED ORDER — FLUTICASONE PROPIONATE 50 MCG/ACT NA SUSP: 2.0000 | Freq: Every day | NASAL | 0 refills | Status: AC

## 2023-07-01 ENCOUNTER — Ambulatory Visit (HOSPITAL_COMMUNITY)
Admission: RE | Admit: 2023-07-01 | Discharge: 2023-07-01 | Disposition: A | Discharge status: Home / Self Care | Payer: 59 | Source: Ambulatory Visit | Attending: Internal Medicine | Admitting: Internal Medicine

## 2023-07-01 ENCOUNTER — Ambulatory Visit: Payer: 59 | Admitting: Student

## 2023-07-01 VITALS — BP 148/61 | HR 73 | Temp 98.7°F | Ht 66.0 in | Wt 274.4 lb

## 2023-07-01 DIAGNOSIS — I10 Essential (primary) hypertension: Secondary | ICD-10-CM | POA: Diagnosis not present

## 2023-07-01 DIAGNOSIS — R0789 Other chest pain: Secondary | ICD-10-CM | POA: Insufficient documentation

## 2023-07-01 DIAGNOSIS — M25511 Pain in right shoulder: Secondary | ICD-10-CM

## 2023-07-01 DIAGNOSIS — R9431 Abnormal electrocardiogram [ECG] [EKG]: Secondary | ICD-10-CM | POA: Insufficient documentation

## 2023-07-01 DIAGNOSIS — R2241 Localized swelling, mass and lump, right lower limb: Secondary | ICD-10-CM

## 2023-07-01 NOTE — Patient Instructions (Signed)
Thank you, Allison Mcintyre for allowing [place] to provide your care today. Today we discussed chest pain, right shoulder discomfort, and a mass on your posterior thigh.    I have ordered the following labs for you:   Lab Orders         BMP8+Anion Gap       I have ordered the following medication/changed the following medications:   Stop the following medications: Medications Discontinued During This Encounter  Medication Reason   azithromycin (ZITHROMAX) 250 MG tablet Completed Course     Start the following medications: No orders of the defined types were placed in this encounter.    Follow up: 3 months    Remember: To follow up with your cardiologist and have the stress testing done.   Should you have any questions or concerns please call the internal medicine clinic at [phone].     Faith Rogue, D.O. [facility]

## 2023-07-01 NOTE — Progress Notes (Signed)
Internal Medicine Clinic Attending  Case discussed with the resident at the time of the visit.  We reviewed the resident's history and exam and pertinent patient test results.  I agree with the assessment, diagnosis, and plan of care documented in the resident's note.
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
## 2023-07-02 ENCOUNTER — Emergency Department (HOSPITAL_COMMUNITY): Payer: 59

## 2023-07-02 ENCOUNTER — Emergency Department (HOSPITAL_COMMUNITY)
Admission: EM | Admit: 2023-07-02 | Discharge: 2023-07-02 | Disposition: A | Discharge status: Home / Self Care | Payer: 59 | Attending: Emergency Medicine | Admitting: Emergency Medicine

## 2023-07-02 ENCOUNTER — Emergency Department (HOSPITAL_BASED_OUTPATIENT_CLINIC_OR_DEPARTMENT_OTHER): Payer: 59

## 2023-07-02 ENCOUNTER — Other Ambulatory Visit: Payer: Self-pay

## 2023-07-02 ENCOUNTER — Encounter (HOSPITAL_COMMUNITY): Payer: Self-pay | Admitting: Emergency Medicine

## 2023-07-02 DIAGNOSIS — D649 Anemia, unspecified: Secondary | ICD-10-CM | POA: Diagnosis not present

## 2023-07-02 DIAGNOSIS — Z79899 Other long term (current) drug therapy: Secondary | ICD-10-CM | POA: Diagnosis not present

## 2023-07-02 DIAGNOSIS — I10 Essential (primary) hypertension: Secondary | ICD-10-CM | POA: Diagnosis not present

## 2023-07-02 DIAGNOSIS — R531 Weakness: Secondary | ICD-10-CM | POA: Insufficient documentation

## 2023-07-02 DIAGNOSIS — R600 Localized edema: Secondary | ICD-10-CM | POA: Diagnosis not present

## 2023-07-02 DIAGNOSIS — R062 Wheezing: Secondary | ICD-10-CM | POA: Insufficient documentation

## 2023-07-02 DIAGNOSIS — R9431 Abnormal electrocardiogram [ECG] [EKG]: Secondary | ICD-10-CM | POA: Insufficient documentation

## 2023-07-02 DIAGNOSIS — R079 Chest pain, unspecified: Secondary | ICD-10-CM | POA: Diagnosis not present

## 2023-07-02 DIAGNOSIS — F17210 Nicotine dependence, cigarettes, uncomplicated: Secondary | ICD-10-CM | POA: Insufficient documentation

## 2023-07-02 DIAGNOSIS — R0602 Shortness of breath: Secondary | ICD-10-CM | POA: Insufficient documentation

## 2023-07-02 DIAGNOSIS — M25511 Pain in right shoulder: Secondary | ICD-10-CM | POA: Insufficient documentation

## 2023-07-02 DIAGNOSIS — M7989 Other specified soft tissue disorders: Secondary | ICD-10-CM | POA: Diagnosis not present

## 2023-07-02 LAB — CBC
HCT: 35.5 % — ABNORMAL LOW (ref 36.0–46.0)
Hemoglobin: 11.6 g/dL — ABNORMAL LOW (ref 12.0–15.0)
MCH: 32 pg (ref 26.0–34.0)
MCHC: 32.7 g/dL (ref 30.0–36.0)
MCV: 97.8 fL (ref 80.0–100.0)
Platelets: 301 10*3/uL (ref 150–400)
RBC: 3.63 MIL/uL — ABNORMAL LOW (ref 3.87–5.11)
RDW: 13.2 % (ref 11.5–15.5)
WBC: 7 10*3/uL (ref 4.0–10.5)
nRBC: 0 % (ref 0.0–0.2)

## 2023-07-02 LAB — BASIC METABOLIC PANEL
Anion gap: 11 (ref 5–15)
BUN: 13 mg/dL (ref 8–23)
CO2: 23 mmol/L (ref 22–32)
Calcium: 9.5 mg/dL (ref 8.9–10.3)
Chloride: 107 mmol/L (ref 98–111)
Creatinine, Ser: 0.55 mg/dL (ref 0.44–1.00)
GFR, Estimated: >60 mL/min (ref >60)
Glucose, Bld: 110 mg/dL — ABNORMAL HIGH (ref 70–99)
Potassium: 3.5 mmol/L (ref 3.5–5.1)
Sodium: 141 mmol/L (ref 135–145)

## 2023-07-02 LAB — TROPONIN I (HIGH SENSITIVITY)
Troponin I (High Sensitivity): 3 ng/L (ref <18)
Troponin I (High Sensitivity): 4 ng/L (ref <18)

## 2023-07-02 MED ORDER — IOHEXOL 350 MG/ML SOLN
75.0000 mL | Freq: Once | INTRAVENOUS | Status: AC | PRN
  Administered 2023-07-02: 75 mL via INTRAVENOUS

## 2023-07-02 NOTE — Progress Notes (Signed)
Lower extremity venous duplex completed. Please see CV Procedures for preliminary results.  Shona Simpson, RVT 07/02/23 1:29 PM

## 2023-07-02 NOTE — Assessment & Plan Note (Signed)
Suspected lipoma. Mobile, soft, symmetrical painless mass present on right posterior thigh.  Plan: -[place] ordered, will consider general surgery evaluation depending on US findings.

## 2023-07-02 NOTE — Assessment & Plan Note (Signed)
Shoulder pain R shoulder discomfort described as acute - Unable to isolate focal tenderness to any one specific tendon, ROM intact - Reassured patient, avidsed watchful waiting for now, return if worsening or no improvement

## 2023-07-02 NOTE — ED Provider Notes (Addendum)
[place] EMERGENCY DEPARTMENT AT [facility] Provider Note   CSN: [id number] Arrival date & time: 07/02/23  6440     History  Chief Complaint  Patient presents with   Abnormal ECG   Weakness   Chest Pain    Allison Mcintyre is a 63 y.o. female with past medical history significant for hypertension, hyperlipidemia, tobacco abuse who presents with concern for shortness of breath, abnormal EKG. reports that she has felt short of breath intermittently for months, slightly worse over the last few days, but primary reason to come to the ED was because Q waves and T wave inversions noted on EKG performed by PCP.  She denies any chest pain although she reports that she had some sharp chest pain a few days ago.  She denies any recent travel, history of hormonal birth control use, history of previous blood clots.  She reports no previous formal diagnosis of COPD although it is documented in her chart, she reports that she smokes couple cigarettes a day but used to smoke around 1 pack every 2 to 3 days.   Weakness Associated symptoms: chest pain   Chest Pain Associated symptoms: weakness        Home Medications Prior to Admission medications   Medication Sig Start Date End Date Taking? Authorizing Provider  albuterol (VENTOLIN HFA) 108 (90 Base) MCG/ACT inhaler Inhale 2 puffs into the lungs every 6 (six) hours as needed for wheezing or shortness of breath. 06/24/23   Marrianne Mood, MD  diclofenac (VOLTAREN) 75 MG EC tablet Take 1 tablet (75 mg total) by mouth 2 (two) times daily. 03/09/23   Marrianne Mood, MD  docusate sodium (COLACE) 100 MG capsule Take 1 capsule (100 mg total) by mouth 2 (two) times daily. 10/06/22   Marrianne Mood, MD  fluticasone (FLONASE) 50 MCG/ACT nasal spray Place 2 sprays into both nostrils daily. 06/24/23   Marrianne Mood, MD  losartan-hydrochlorothiazide (HYZAAR) 50-12.5 MG tablet Take 1 tablet by mouth daily. 10/06/22   Marrianne Mood, MD   pravastatin (PRAVACHOL) 40 MG tablet Take 1 tablet (40 mg total) by mouth daily. 10/06/22   Marrianne Mood, MD  promethazine-dextromethorphan (PROMETHAZINE-DM) 6.25-15 MG/5ML syrup Take 5 mLs by mouth 3 (three) times daily as needed for cough. 01/18/23   Philomena Doheny, MD  Tiotropium Bromide Monohydrate (SPIRIVA RESPIMAT) 2.5 MCG/ACT AERS Inhale 2 puffs into the lungs daily. 06/24/23   Marrianne Mood, MD  verapamil (CALAN-SR) 120 MG CR tablet TAKE 1 TABLET (120 MG TOTAL) BY MOUTH DAILY. 08/06/22 08/01/23  Marolyn Haller, MD  budesonide-formoterol [facility]) 80-4.5 MCG/ACT inhaler Inhale 2 puffs into the lungs daily. Patient taking differently: Inhale 2 puffs into the lungs daily as needed (for flares). 04/27/21 02/26/22  Valinda Hoar, NP      Allergies    Patient has no known allergies.    Review of Systems   Review of Systems  Cardiovascular:  Positive for chest pain.  Neurological:  Positive for weakness.  All other systems reviewed and are negative.   Physical Exam Updated Vital Signs BP (!) 157/71   Pulse (!) 55   Temp 97.7 F (36.5 C) (Oral)   Resp (!) 26   Ht 5\' 6"  (1.676 m)   Wt 124 kg   SpO2 99%   BMI 44.12 kg/m  Physical Exam Vitals and nursing note reviewed.  Constitutional:      General: She is not in acute distress.    Appearance: Normal appearance.  HENT:  Head: Normocephalic and atraumatic.  Eyes:     General:        Right eye: No discharge.        Left eye: No discharge.  Cardiovascular:     Rate and Rhythm: Normal rate and regular rhythm.     Heart sounds: No murmur heard.    No friction rub. No gallop.  Pulmonary:     Effort: Pulmonary effort is normal.     Breath sounds: Normal breath sounds.     Comments: Mild wheezing in the left upper lobe, no acute respiratory distress, no rhonchi, no focal consolidation Abdominal:     General: Bowel sounds are normal.     Palpations: Abdomen is soft.  Skin:    General: Skin is warm  and dry.     Capillary Refill: Capillary refill takes less than 2 seconds.  Neurological:     Mental Status: She is alert and oriented to person, place, and time.  Psychiatric:        Mood and Affect: Mood normal.        Behavior: Behavior normal.     ED Results / Procedures / Treatments   Labs (all labs ordered are listed, but only abnormal results are displayed) Labs Reviewed  BASIC METABOLIC PANEL - Abnormal; Notable for the following components:      Result Value   Glucose, Bld 110 (*)    All other components within normal limits  CBC - Abnormal; Notable for the following components:   RBC 3.63 (*)    Hemoglobin 11.6 (*)    HCT 35.5 (*)    All other components within normal limits  TROPONIN I (HIGH SENSITIVITY)  TROPONIN I (HIGH SENSITIVITY)    EKG None  Radiology CT Angio Chest PE W and/or Wo Contrast Result Date: 07/02/2023 CLINICAL DATA:  Pulmonary embolism (PE) suspected, high prob. Shortness of breath. EXAM: CT ANGIOGRAPHY CHEST WITH CONTRAST TECHNIQUE: Multidetector CT imaging of the chest was performed using the standard protocol during bolus administration of intravenous contrast. Multiplanar CT image reconstructions and MIPs were obtained to evaluate the vascular anatomy. RADIATION DOSE REDUCTION: This exam was performed according to the departmental dose-optimization program which includes automated exposure control, adjustment of the mA and/or kV according to patient size and/or use of iterative reconstruction technique. CONTRAST:  75mL OMNIPAQUE IOHEXOL 350 MG/ML SOLN COMPARISON:  None Available. FINDINGS: Cardiovascular: No evidence of embolism to the proximal subsegmental pulmonary artery level. Normal cardiac size. No pericardial effusion. No aortic aneurysm. Mediastinum/Nodes: Visualized thyroid gland appears grossly unremarkable. No solid / cystic mediastinal masses. The esophagus is nondistended precluding optimal assessment. There are few mildly prominent  mediastinal lymph nodes, which do not meet the size criteria for lymphadenopathy and though indeterminate most likely benign in etiology. There are multiple calcified left hilar lymph nodes, nonspecific but likely sequela of prior granulomatous infection. No axillary or hilar lymphadenopathy by size criteria. Lungs/Pleura: The central tracheo-bronchial tree is patent. There are patchy areas of linear, plate-like atelectasis and/or scarring throughout bilateral lungs. No mass or consolidation. No pleural effusion or pneumothorax. No suspicious lung nodules. Upper Abdomen: There is a 1.2 x 1.5 cm simple cyst in the caudate lobe of the liver. There are additional at least 2, sub 5 mm, hypoattenuating foci in the liver, which are too small to adequately characterize. Surgically absent gallbladder. Remaining visualized upper abdominal viscera within normal limits. Musculoskeletal: The visualized soft tissues of the chest wall are grossly unremarkable. No suspicious osseous lesions. There  are mild multilevel degenerative changes in the visualized spine. Review of the MIP images confirms the above findings. IMPRESSION: 1. No evidence of embolism to the proximal subsegmental pulmonary artery level. 2. No lung mass, consolidation, pleural effusion or pneumothorax. 3. Multiple other nonacute observations, as described above. Electronically Signed   By: Jules Schick M.D.   On: 07/02/2023 15:07   VAS [place] LOWER EXTREMITY VENOUS (DVT) (7a-7p) Result Date: 07/02/2023  Lower Venous DVT Study Patient Name:  NAW ROSENFELD  Date of Exam:   07/02/2023 Medical Rec #: [id number]            Accession #:    [id number] Date of Birth: Mcintyre 11, 1962            Patient Gender: F Patient Age:   19 years Exam Location:  [facility] Procedure:      VAS [place] LOWER EXTREMITY VENOUS (DVT) Referring Phys: Anjeli Casad --------------------------------------------------------------------------------  Indications: Swelling, and Edema.   Limitations: Body habitus. Comparison Study: No significant changes seen since previous exam 07/31/21. Performing Technologist: Shona Simpson  Examination Guidelines: A complete evaluation includes B-mode imaging, spectral Doppler, color Doppler, and power Doppler as needed of all accessible portions of each vessel. Bilateral testing is considered an integral part of a complete examination. Limited examinations for reoccurring indications may be performed as noted. The reflux portion of the exam is performed with the patient in reverse Trendelenburg.  +-----+---------------+---------+-----------+----------+--------------+ RIGHTCompressibilityPhasicitySpontaneityPropertiesThrombus Aging +-----+---------------+---------+-----------+----------+--------------+ CFV  Full           Yes      Yes                                 +-----+---------------+---------+-----------+----------+--------------+   +---------+---------------+---------+-----------+----------+--------------+ LEFT     CompressibilityPhasicitySpontaneityPropertiesThrombus Aging +---------+---------------+---------+-----------+----------+--------------+ CFV      Full           Yes      Yes                                 +---------+---------------+---------+-----------+----------+--------------+ SFJ      Full                                                        +---------+---------------+---------+-----------+----------+--------------+ FV Prox  Full                                                        +---------+---------------+---------+-----------+----------+--------------+ FV Mid   Full                                                        +---------+---------------+---------+-----------+----------+--------------+ FV DistalFull                                                        +---------+---------------+---------+-----------+----------+--------------+  PFV      Full                                                         +---------+---------------+---------+-----------+----------+--------------+ POP      Full           Yes      Yes                                 +---------+---------------+---------+-----------+----------+--------------+ PTV      Full                                                        +---------+---------------+---------+-----------+----------+--------------+ PERO     Full                                                        +---------+---------------+---------+-----------+----------+--------------+     Summary: RIGHT: - No evidence of common femoral vein obstruction.   LEFT: - There is no evidence of deep vein thrombosis in the lower extremity.  - No cystic structure found in the popliteal fossa.  *See table(s) above for measurements and observations. Electronically signed by Sherald Hess MD on 07/02/2023 at 1:49:54 PM.    Final    DG Chest 2 View Result Date: 07/02/2023 CLINICAL DATA:  Shortness of breath. EXAM: CHEST - 2 VIEW COMPARISON:  December 02, 2022. FINDINGS: The heart size and mediastinal contours are within normal limits. Stable scarring at the left mid lung zone. No focal consolidation, pleural effusion or pneumothorax. No acute osseous abnormality. IMPRESSION: No acute cardiopulmonary findings. Electronically Signed   By: Hart Robinsons M.D.   On: 07/02/2023 11:46    Procedures Procedures    Medications Ordered in ED Medications  iohexol (OMNIPAQUE) 350 MG/ML injection 75 mL (75 mLs Intravenous Contrast Given 07/02/23 1347)    ED Course/ Medical Decision Making/ A&P Clinical Course as of 07/02/23 1516  Fri Jul 02, 2023  1337 No DVT noted on vascular ultrasound, will still perform PE study. [CP]  1500 Sent by PCP for abnormal ECG (deep q waves, TWI) not seen here  HR 59, sinus arrhythmia  Dyspnea for months, L>R leg swelling [ ]  CT PE  [GG]    Clinical Course User Index [CP] Abigael Mogle, Harrel Carina, PA-C [GG] Mikeal Hawthorne, MD                                 Medical Decision Making Amount and/or Complexity of Data Reviewed Labs: ordered. Radiology: ordered.  Risk Prescription drug management.   This patient is a 63 y.o. female  who presents to the ED for concern of shob, abnormal EKG.   Differential diagnoses prior to evaluation: The emergent differential diagnosis includes, but is not limited to,  asthma exacerbation, COPD exacerbation, acute upper respiratory infection, acute bronchitis, chronic bronchitis, interstitial lung disease,  ARDS, PE, pneumonia, atypical ACS, carbon monoxide poisoning, spontaneous pneumothorax, new CHF vs CHF exacerbation, versus other . This is not an exhaustive differential.   Past Medical History / Co-morbidities / Social History: hypertension, hyperlipidemia, tobacco abuse   Additional history: Chart reviewed. Pertinent results include: Reviewed previous echo from 2016 with 65 to 70% left regular ejection fraction, otherwise for the most part unremarkable  Physical Exam: Physical exam performed. The pertinent findings include: Patient does have some bilateral lower extremity edema, left slightly greater than right on my exam.  Lab Tests/Imaging studies: I personally interpreted labs/imaging and the pertinent results include: BMP overall unremarkable, troponin initially normal at 3, her CBC is notable for mild anemia, hemoglobin 11.6.  I independently interpreted vascular ultrasound of bilateral lower extremities, plain film chest x-ray which shows no evidence of DVT, no evidence of acute intrathoracic abnormality, given her abnormal symptoms I do think that she would benefit from a PE study to further evaluate for any pulmonary pathology, I independently interpreted the study which shows no evidence of acute pulmonary embolus.  I agree with radiologist hypertension.  Cardiac monitoring: EKG obtained and interpreted by myself and attending physician which shows:  Sinus bradycardia with intermittent arrhythmia, no acute ST-T changes.  The Q waves and T wave inversions noted on recent EKG primary care office are no longer noted.   Discussed possible short course of steroids given some wheezing, probable bronchitis changes, but patient declines at this time, I think this is reasonable, encouraged her to continue using her home albuterol, Spiriva, encourage close follow-up with cardiology to recheck an EKG given the changes noted by her PCP previously.  Discussed extensive return precautions for shortness of breath, chest pain that are worsening compared to her baseline.  Final Clinical Impression(s) / ED Diagnoses Final diagnoses:  Shortness of breath    Rx / DC Orders ED Discharge Orders     None         Olene Floss, PA-C 07/02/23 1501    [place] 07/02/23 1516    Gerhard Munch, MD 07/02/23 1555

## 2023-07-02 NOTE — Discharge Instructions (Addendum)
Please follow-up with your primary care doctor and your heart doctor related to your EKG changes, you do not have any blood clots, you do not have any evidence of a pulmonary embolus.  Your ED workup was very reassuring.  I would recommend quitting smoking.

## 2023-07-02 NOTE — Assessment & Plan Note (Signed)
Chest pain - Atypical, burning in nature with onset this morning and persisting - Declines ED referral; EKG showing new Q waves in inferior leads with associated T wave inversions - Has never had an ischemic eval; does have CAD risk factors including HTN, tobacco use - Was seen by cardiology in May and had a NM stress test ordered but not done.  - Urgent cards follow up scheduled 1/23

## 2023-07-02 NOTE — ED Provider Notes (Incomplete)
Patient care assumed from previous provider.   Patient care of Allison Mcintyre is a 63 y.o. female from previous provider. Please see the original provider note from this emergency department encounter for full history and physical.   Course of Care and my assessment at the time of sign out is detailed in the ED Course below.   Clinical Course as of 07/02/23 1501  Fri Jul 02, 2023  1337 No DVT noted on vascular ultrasound, will still perform PE study. [CP]  1500 Sent by PCP for abnormal ECG (deep q waves, TWI) not seen here  HR 59, sinus arrhythmia  Dyspnea for months, L>R leg swelling [ ]  CT PE  [GG]    Clinical Course User Index [CP] Prosperi, Harrel Carina, PA-C [GG] Mikeal Hawthorne, MD   ***  Labs reviewed by myself and considered in medical decision making.  Imaging reviewed by myself and considered in medical decision making. Imaging final read interpreted by radiology.  No diagnosis found.   {Adult ED Dispo Smartlist:59708}  @DISPOSITION @   The plan for this patient was discussed with Dr. Silverio Lay, Gonzella Lex, MD ***, who voiced agreement and who oversaw evaluation and treatment of this patient.

## 2023-07-02 NOTE — ED Triage Notes (Signed)
PT went to PCP yesterday for annual check up. Pt states she has been feeling SOB and weak for several weeks and they told her her EKG was abnormal and to follow up at ER or Cardiology. Pt denies any chest pain at this time but has had intermittent tingling in chest.

## 2023-07-02 NOTE — ED Notes (Signed)
Pt refused to be hooked up to the monitor, she said she would like to eat her food first.

## 2023-07-05 ENCOUNTER — Other Ambulatory Visit: Payer: Self-pay | Admitting: Student

## 2023-07-05 DIAGNOSIS — E785 Hyperlipidemia, unspecified: Secondary | ICD-10-CM

## 2023-07-05 DIAGNOSIS — Z760 Encounter for issue of repeat prescription: Secondary | ICD-10-CM

## 2023-07-08 ENCOUNTER — Encounter: Payer: Self-pay | Admitting: Internal Medicine

## 2023-07-08 ENCOUNTER — Ambulatory Visit: Payer: 59 | Attending: Internal Medicine | Admitting: Internal Medicine

## 2023-07-08 VITALS — BP 124/60 | HR 61 | Ht 66.0 in | Wt 278.0 lb

## 2023-07-08 DIAGNOSIS — R0789 Other chest pain: Secondary | ICD-10-CM

## 2023-07-08 DIAGNOSIS — I10 Essential (primary) hypertension: Secondary | ICD-10-CM

## 2023-07-08 NOTE — Progress Notes (Signed)
Cardiology Office Note:    Date:  07/08/2023   ID:  Allison Mcintyre, DOB 1961-01-08, MRN [id number]  PCP:  Marrianne Mood, MD   [facility] Providers Cardiologist:  Maisie Fus, MD     Referring MD: Marrianne Mood, MD   Chief Complaint  Patient presents with   Follow-up  Chest pain  History of Present Illness:    Allison Mcintyre is a 63 y.o. female with a hx of smoking, A1c 5.2%,referral for CP, note is incomplete. She notes her chest was hurting "the other day".  Notes central CP. She noted it for a few hours. Notes tingling in her wrist and shoulder. No clear trigger.  No orthopnea or PND Smoking since she was 59 years old Sister has a "leaking valve:  Saw Dr. Eden Emms in 2016. She had dependent edema. Noted some palpitations. EKG was unremarkable.  Echo showed normal LV function/RV fxn. No significant valve dx.  Interim hx 07/08/2023 She is still having SOB/CP.  I ordered a stress test on her prior visit which she is not able to complete.  Otherwise she was seen in the emergency room recently with shortness of breath.  Suspect COPD may be contributing with her longstanding smoking history.  She has Spiriva.   Past Medical History:  Diagnosis Date   Anemia    Bronchitis    Headache    Hemorrhoids    Hyperlipidemia    Hypertension    Menorrhagia     Past Surgical History:  Procedure Laterality Date   ABDOMINAL HYSTERECTOMY  2003   CESAREAN SECTION     CHOLECYSTECTOMY N/A 01/19/2022   Procedure: LAPAROSCOPIC CHOLECYSTECTOMY WITH INTRAOPERATIVE CHOLANGIOGRAM;  Surgeon: Fritzi Mandes, MD;  Location: [facility];  Service: General;  Laterality: N/A;   COLONOSCOPY     ERCP N/A 01/20/2022   Procedure: ENDOSCOPIC RETROGRADE CHOLANGIOPANCREATOGRAPHY (ERCP);  Surgeon: Iva Boop, MD;  Location: [facility];  Service: Gastroenterology;  Laterality: N/A;   POLYPECTOMY     REMOVAL OF STONES  01/20/2022   Procedure: REMOVAL OF STONES;  Surgeon: Iva Boop, MD;  Location: [facility];  Service: Gastroenterology;;   Dennison Mascot  01/20/2022   Procedure: Dennison Mascot;  Surgeon: Iva Boop, MD;  Location: [facility];  Service: Gastroenterology;;    Current Medications: No outpatient medications have been marked as taking for the 07/08/23 encounter (Office Visit) with Maisie Fus, MD.     Allergies:   Patient has no known allergies.   Social History   Socioeconomic History   Marital status: Single    Spouse name: Not on file   Number of children: Not on file   Years of education: Not on file   Highest education level: Not on file  Occupational History   Not on file  Tobacco Use   Smoking status: Every Day    Current packs/day: 0.25    Types: Cigarettes   Smokeless tobacco: Never   Tobacco comments:    5 per day  Vaping Use   Vaping status: Never Used  Substance and Sexual Activity   Alcohol use: Yes    Alcohol/week: 7.0 standard drinks of alcohol    Types: 7 Cans of beer per week   Drug use: No   Sexual activity: Not on file  Other Topics Concern   Not on file  Social History Narrative   Not on file   Social Drivers of Health   Financial Resource Strain: Low Risk  (04/28/2022)  Overall Financial Resource Strain (CARDIA)    Difficulty of Paying Living Expenses: Not hard at all  Food Insecurity: Food Insecurity Present (04/28/2022)   Hunger Vital Sign    Worried About Running Out of Food in the Last Year: Never true    Ran Out of Food in the Last Year: Sometimes true  Transportation Needs: No Transportation Needs (04/28/2022)   PRAPARE - Administrator, Civil Service (Medical): No    Lack of Transportation (Non-Medical): No  Physical Activity: Sufficiently Active (04/28/2022)   Exercise Vital Sign    Days of Exercise per Week: 5 days    Minutes of Exercise per Session: 60 min  Stress: No Stress Concern Present (04/28/2022)   [employer] of Occupational Health - Occupational  Stress Questionnaire    Feeling of Stress : Only a little  Social Connections: Moderately Isolated (04/28/2022)   Social Connection and Isolation Panel [NHANES]    Frequency of Communication with Friends and Family: More than three times a week    Frequency of Social Gatherings with Friends and Family: Never    Attends Religious Services: Never    Database administrator or Organizations: No    Attends Engineer, structural: Never    Marital Status: Living with partner     Family History: The patient's family history includes Bronchitis in her father; Cancer in her mother; Pancreatic cancer in her mother. There is no history of Colon polyps, Esophageal cancer, Stomach cancer, Rectal cancer, Colon cancer, or Breast cancer.  ROS:   Please see the history of present illness.     All other systems reviewed and are negative.  EKGs/Labs/Other Studies Reviewed:    The following studies were reviewed today:   EKG:  EKG is  ordered today.  The ekg ordered today demonstrates   10/15/2022- NSR  Recent Labs: 01/18/2023: TSH 1.490 07/02/2023: BUN 13; Creatinine, Ser 0.55; Hemoglobin 11.6; Platelets 301; Potassium 3.5; Sodium 141   Recent Lipid Panel    Component Value Date/Time   CHOL 198 09/02/2022 1622   TRIG 144 09/02/2022 1622   HDL 59 09/02/2022 1622   CHOLHDL 3.4 09/02/2022 1622   CHOLHDL 3.7 12/11/2013 1153   VLDL 29 12/11/2013 1153   LDLCALC 114 (H) 09/02/2022 1622     Risk Assessment/Calculations:     Physical Exam:    VS:   There were no vitals filed for this visit.   Wt Readings from Last 3 Encounters:  07/02/23 273 lb 5.9 oz (124 kg)  07/01/23 274 lb 6.4 oz (124.5 kg)  01/18/23 282 lb 14.4 oz (128.3 kg)     GEN:  Well nourished, well developed in no acute distress HEENT: Normal NECK: No JVD; No carotid bruits CARDIAC: RRR, no murmurs, rubs, gallops RESPIRATORY: Normal work of breathing, mild wheeze ABDOMEN: Soft, non-tender,  non-distended MUSCULOSKELETAL:  No edema; No deformity  SKIN: Warm and dry NEUROLOGIC:  Alert and oriented x 3 PSYCHIATRIC:  Normal affect   ASSESSMENT:    CP/SOB Suspect this is probably related to COPD, although her CT did not show significant emphysema.  Agree with inhaler With CVD risk of smoking and chest pressure will obtain an exercise stress to help risk stratify her. Otherwise recommend smoking cessation. PLAN:    In order of problems listed above:  Lexiscan SPECT Follow up PRN      Shared Decision Making/Informed Consent The risks [chest pain, shortness of breath, cardiac arrhythmias, dizziness, blood pressure fluctuations, myocardial infarction, stroke/transient  ischemic attack, nausea, vomiting, allergic reaction, radiation exposure, metallic taste sensation and life-threatening complications (estimated to be 1 in 10,000)], benefits (risk stratification, diagnosing coronary artery disease, treatment guidance) and alternatives of a nuclear stress test were discussed in detail with Ms. Howson and she agrees to proceed.    Medication Adjustments/Labs and Tests Ordered: Current medicines are reviewed at length with the patient today.  Concerns regarding medicines are outlined above.  Orders Placed This Encounter  Procedures   EKG 12-Lead   No orders of the defined types were placed in this encounter.   There are no Patient Instructions on file for this visit.   Signed, Maisie Fus, MD  07/08/2023 2:44 PM    [facility]

## 2023-07-08 NOTE — Patient Instructions (Signed)
Medication Instructions:  No changes  *If you need a refill on your cardiac medications before your next appointment, please call your pharmacy*   Lab Work: None  If you have labs (blood work) drawn today and your tests are completely normal, you will receive your results only by: MyChart Message (if you have MyChart) OR A paper copy in the mail If you have any lab test that is abnormal or we need to change your treatment, we will call you to review the results.   Testing/Procedures: Allison Mcintyre   has ordered a Lexiscan Myocardial Perfusion Imaging Study.  Please arrive 15 minutes prior to your appointment time for registration and insurance purposes.   The test will take approximately 3 to 4 hours to complete; you may bring reading material.  If someone comes with you to your appointment, they will need to remain in the main lobby due to limited space in the testing area. **If you are pregnant or breastfeeding, please notify the nuclear lab prior to your appointment**   How to prepare for your Myocardial Perfusion Test: Do not eat or drink 3 hours prior to your test, except you may have water. Do not consume products containing caffeine (regular or decaffeinated) 12 hours prior to your test. (ex: coffee, chocolate, sodas, tea). Do wear comfortable clothes (no dresses or overalls) and walking shoes, tennis shoes preferred (No heels or open toe shoes are allowed). Do NOT wear cologne, perfume, aftershave, or lotions (deodorant is allowed). If you use an inhaler, use it the AM of your test and bring it with you.  If you use a nebulizer, use it the AM of your test.  If these instructions are not followed, your test will have to be rescheduled.    Follow-Up: At [facility], you and your health needs are our priority.  As part of our continuing mission to provide you with exceptional heart care, we have created designated Provider Care Teams.  These Care Teams include your primary  Cardiologist (physician) and Advanced Practice Providers (APPs -  Physician Assistants and Nurse Practitioners) who all work together to provide you with the care you need, when you need it.   Your next appointment:   Follow up as needed   Provider:   Carolan Clines  Other Instructions

## 2023-07-21 ENCOUNTER — Telehealth (HOSPITAL_COMMUNITY): Payer: Self-pay | Admitting: *Deleted

## 2023-07-21 NOTE — Telephone Encounter (Signed)
 Patient given detailed instructions per Myocardial Perfusion Study Information Sheet for the test on 07/26/2023 at 1:30. Patient notified to arrive 15 minutes early and that it is imperative to arrive on time for appointment to keep from having the test rescheduled.  If you need to cancel or reschedule your appointment, please call the office within 24 hours of your appointment. . Patient verbalized understanding.Allison Mcintyre

## 2023-07-26 ENCOUNTER — Encounter (HOSPITAL_COMMUNITY): Payer: 59

## 2023-07-26 ENCOUNTER — Ambulatory Visit (HOSPITAL_COMMUNITY): Payer: 59 | Attending: Internal Medicine

## 2023-07-26 DIAGNOSIS — I10 Essential (primary) hypertension: Secondary | ICD-10-CM | POA: Diagnosis present

## 2023-07-26 DIAGNOSIS — R0789 Other chest pain: Secondary | ICD-10-CM | POA: Diagnosis present

## 2023-07-26 MED ORDER — TECHNETIUM TC 99M TETROFOSMIN IV KIT
32.4000 | PACK | Freq: Once | INTRAVENOUS | Status: AC | PRN
  Administered 2023-07-26: 32.4 via INTRAVENOUS

## 2023-07-26 MED ORDER — REGADENOSON 0.4 MG/5ML IV SOLN
0.4000 mg | Freq: Once | INTRAVENOUS | Status: AC
Start: 2023-07-26 — End: 2023-07-26
  Administered 2023-07-26: 0.4 mg via INTRAVENOUS

## 2023-07-27 ENCOUNTER — Ambulatory Visit (HOSPITAL_COMMUNITY): Payer: 59

## 2023-07-30 ENCOUNTER — Ambulatory Visit (HOSPITAL_COMMUNITY): Payer: 59 | Attending: Cardiology

## 2023-07-30 LAB — MYOCARDIAL PERFUSION IMAGING
LV dias vol: 72 mL (ref 46–106)
LV sys vol: 23 mL
Nuc Stress EF: 68 %
Peak HR: 96 {beats}/min
Rest HR: 78 {beats}/min
SDS: 5
SRS: 1
SSS: 6
ST Depression (mm): 0 mm
Stress Nuclear Isotope Dose: 32.4 mCi
TID: 0.9

## 2023-07-30 MED ORDER — TECHNETIUM TC 99M TETROFOSMIN IV KIT
29.1000 | PACK | Freq: Once | INTRAVENOUS | Status: AC | PRN
  Administered 2023-07-30: 29.1 via INTRAVENOUS

## 2023-08-03 ENCOUNTER — Encounter: Payer: Self-pay | Admitting: Internal Medicine

## 2023-08-12 ENCOUNTER — Ambulatory Visit (HOSPITAL_COMMUNITY)
Admission: RE | Admit: 2023-08-12 | Discharge: 2023-08-12 | Disposition: A | Discharge status: Home / Self Care | Payer: 59 | Source: Ambulatory Visit | Attending: Internal Medicine | Admitting: Internal Medicine

## 2023-08-12 DIAGNOSIS — R2241 Localized swelling, mass and lump, right lower limb: Secondary | ICD-10-CM | POA: Insufficient documentation

## 2023-08-18 ENCOUNTER — Telehealth: Payer: Self-pay | Admitting: *Deleted

## 2023-08-18 NOTE — Telephone Encounter (Signed)
 Call from patient requesting results to [place].  Will forward to team.

## 2023-08-18 NOTE — Telephone Encounter (Signed)
 RTC to patient to inform her that the results are not back from her [facility].  The doctor will call her when they are ready.

## 2023-08-23 NOTE — Telephone Encounter (Signed)
 Communication  Patient called and said she called last week about her ultrasound results and I informed the patient that per the notes when the results come in they will call her and she said okay. I informed the patient that I would send the nurse another message about calling her

## 2023-09-03 ENCOUNTER — Encounter: Payer: Self-pay | Admitting: Student

## 2023-09-03 NOTE — Telephone Encounter (Signed)
 Copied from CRM [id number]. Topic: Clinical - Lab/Test Results >> Sep 03, 2023 10:45 AM Antony Haste wrote: Reason for CRM: PT had an [place] RT Lower Extrem LTD Soft Tissue Non Vascular performed on 08/12/2023 and she has been awaiting a callback from her provider to discuss her ultrasound results. She states she will be re-locating to [place] very soon and would like someone to follow-up with her before the office closes today, if possible. Please contact the patient at [phone]

## 2023-09-06 ENCOUNTER — Ambulatory Visit: Admitting: Student

## 2023-09-06 VITALS — BP 150/67 | HR 80 | Temp 98.4°F | Ht 66.0 in | Wt 263.3 lb

## 2023-09-06 DIAGNOSIS — G44209 Tension-type headache, unspecified, not intractable: Secondary | ICD-10-CM | POA: Diagnosis not present

## 2023-09-06 DIAGNOSIS — I10 Essential (primary) hypertension: Secondary | ICD-10-CM

## 2023-09-06 NOTE — Patient Instructions (Signed)
 Thank you so much for coming to the clinic today!   Please record your blood pressures every few days and bring these to your follow-up appointment in one month.   If you have any questions please feel free to the call the clinic at anytime at [phone]. It was a pleasure seeing you!  Best, Dr. Rayvon Char

## 2023-09-06 NOTE — Assessment & Plan Note (Signed)
 Patient presents with 2-week history of headaches.  She describes the pain is worse in her frontal and occipital region, consistent with band like distribution.  I suspect this is a tension type headache.  This has been alleviated with Tylenol.  Patient notes that she has used Tylenol 3 times in the past 2 weeks.  She denies any nausea or vomiting but does endorse some dizziness.  She is unclear if she had any preceding aura.  Patient was counseled on conservative treatment for these headaches and to avoid daily analgesic treatment with concern for rebound headaches.  I have a low suspicion that these are in the context of hypertensive emergency.  - Tylenol as needed
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
## 2023-09-06 NOTE — Assessment & Plan Note (Signed)
 Patient presents today with chief complaint of headaches but notes concern with her elevated blood pressure.  It was elevated at 150/67.  She notes she took her medication this morning.  She currently takes verapamil 120 mg daily and Hyzaar 50-12.5 mg daily.  Patient was counseled on documenting her blood pressures at home and following up in 1 month. - Follow-up in 1 month for blood pressure control

## 2023-09-10 ENCOUNTER — Ambulatory Visit: Payer: Self-pay

## 2023-09-10 NOTE — Telephone Encounter (Signed)
 Call to patient to advise that she g to th ER or Urgent Care as soon as possible.  Patient was informed that we had no available appointments today. States will go tomorrow.  States leg hurts . No swelling but hurts to walk.  Sitting does not bother her. Patient stated she will go to an Urgent Care on tomorrow.

## 2023-09-10 NOTE — Telephone Encounter (Signed)
  Chief Complaint: R calf swelling, pain Symptoms: "can barely walk", swelling to calf, pain,  Frequency: yesterday Pertinent Negatives: Patient denies hx of clots, SOB, CP, difficulty breathing, long trips by plane/car, redness to calf Disposition: [x] ED /[] Urgent Care (no appt availability in office) / [] Appointment(In office/virtual)/ []  [facility]/ [] Home Care/ [] Refused Recommended Disposition /[]  Mobile Bus/ []  Follow-up with PCP Additional Notes: Pt states that she has been having R calf pain since yesterday. Pt can be heard screaming on the phone because of the pain. Pt rates pain at 8/10. States that she "can barely walk on it". Pt states that she would like an appt today in clinic, per protocol pt was recommended to the ED. Pt educated on need for potential imaging. Pt again asked for appt in clinic. Pt advised ED, pt disconnected call.  Copied from CRM [id number]. Topic: Clinical - Red Word Triage >> Sep 10, 2023 10:28 AM Hector Shade B wrote: Patient is having extreme pain on the right leg was sent home from work, she can't walk on the leg she said between the calf and knee she is in sheer panic at this time because the pain is so bad Reason for Disposition  Unable to walk  Answer Assessment - Initial Assessment Questions 1. ONSET: "When did the pain start?"      yesterday 2. LOCATION: "Where is the pain located?"      R calf  3. PAIN: "How bad is the pain?"    (Scale 1-10; or mild, moderate, severe)   -  MILD (1-3): doesn't interfere with normal activities    -  MODERATE (4-7): interferes with normal activities (e.g., work or school) or awakens from sleep, limping    -  SEVERE (8-10): excruciating pain, unable to do any normal activities, unable to walk     Only with movement, 8 4. WORK OR EXERCISE: "Has there been any recent work or exercise that involved this part of the body?"      denies 5. CAUSE: "What do you think is causing the leg pain?"     No prior  injury 6. OTHER SYMPTOMS: "Do you have any other symptoms?" (e.g., chest pain, back pain, breathing difficulty, swelling, rash, fever, numbness, weakness)     denies  Protocols used: Leg Pain-A-AH

## 2023-09-11 ENCOUNTER — Encounter (HOSPITAL_COMMUNITY): Payer: Self-pay

## 2023-09-11 ENCOUNTER — Ambulatory Visit (HOSPITAL_COMMUNITY)
Admission: RE | Admit: 2023-09-11 | Discharge: 2023-09-11 | Disposition: A | Discharge status: Home / Self Care | Source: Ambulatory Visit

## 2023-09-11 ENCOUNTER — Ambulatory Visit (INDEPENDENT_AMBULATORY_CARE_PROVIDER_SITE_OTHER)

## 2023-09-11 VITALS — BP 155/69 | HR 66 | Temp 98.3°F | Resp 18

## 2023-09-11 DIAGNOSIS — G8929 Other chronic pain: Secondary | ICD-10-CM

## 2023-09-11 DIAGNOSIS — M25561 Pain in right knee: Secondary | ICD-10-CM

## 2023-09-11 MED ORDER — DICLOFENAC SODIUM 75 MG PO TBEC: 75.0000 mg | DELAYED_RELEASE_TABLET | Freq: Two times a day (BID) | ORAL | 0 refills | Status: DC

## 2023-09-11 NOTE — ED Provider Notes (Signed)
 [facility]  Note:  This document was prepared using Dragon voice recognition software and may include unintentional dictation errors.  MRN: [id number] DOB: Aug 26, 1960  Subjective:   Allison Mcintyre is a 63 y.o. female presenting for right knee pain x 2 days.  Patient reports that initially she started having mild knee pain about a month ago however symptoms went away.  Patient denies any known trauma or injury to the right knee.  Reports taking Tylenol with no improvement.  Patient reports seeing some swelling to the medial aspect of the right knee but swelling has now subsided.  No current facility-administered medications for this encounter.  Current Outpatient Medications:    albuterol (VENTOLIN HFA) 108 (90 Base) MCG/ACT inhaler, Inhale 2 puffs into the lungs every 6 (six) hours as needed for wheezing or shortness of breath., Disp: 8 g, Rfl: 0   diclofenac (VOLTAREN) 75 MG EC tablet, Take 1 tablet (75 mg total) by mouth 2 (two) times daily., Disp: 30 tablet, Rfl: 0   docusate sodium (COLACE) 100 MG capsule, Take 1 capsule (100 mg total) by mouth 2 (two) times daily., Disp: 10 capsule, Rfl: 0   fluticasone (FLONASE) 50 MCG/ACT nasal spray, Place 2 sprays into both nostrils daily., Disp: 16 g, Rfl: 0   losartan-hydrochlorothiazide (HYZAAR) 50-12.5 MG tablet, TAKE 1 TABLET BY MOUTH DAILY., Disp: 90 tablet, Rfl: 3   pravastatin (PRAVACHOL) 40 MG tablet, TAKE 1 TABLET (40 MG TOTAL) BY MOUTH DAILY., Disp: 90 tablet, Rfl: 3   promethazine-dextromethorphan (PROMETHAZINE-DM) 6.25-15 MG/5ML syrup, Take 5 mLs by mouth 3 (three) times daily as needed for cough., Disp: 240 mL, Rfl: 0   Tiotropium Bromide Monohydrate (SPIRIVA RESPIMAT) 2.5 MCG/ACT AERS, Inhale 2 puffs into the lungs daily., Disp: 4 g, Rfl: 1   verapamil (CALAN-SR) 120 MG CR tablet, TAKE 1 TABLET (120 MG TOTAL) BY MOUTH DAILY., Disp: 90 tablet, Rfl: 3   No Known Allergies  Past Medical History:  Diagnosis Date    Anemia    Bronchitis    Headache    Hemorrhoids    Hyperlipidemia    Hypertension    Menorrhagia      Past Surgical History:  Procedure Laterality Date   ABDOMINAL HYSTERECTOMY  2003   CESAREAN SECTION     CHOLECYSTECTOMY N/A 01/19/2022   Procedure: LAPAROSCOPIC CHOLECYSTECTOMY WITH INTRAOPERATIVE CHOLANGIOGRAM;  Surgeon: Fritzi Mandes, MD;  Location: [facility];  Service: General;  Laterality: N/A;   COLONOSCOPY     ERCP N/A 01/20/2022   Procedure: ENDOSCOPIC RETROGRADE CHOLANGIOPANCREATOGRAPHY (ERCP);  Surgeon: Iva Boop, MD;  Location: [facility];  Service: Gastroenterology;  Laterality: N/A;   POLYPECTOMY     REMOVAL OF STONES  01/20/2022   Procedure: REMOVAL OF STONES;  Surgeon: Iva Boop, MD;  Location: [facility];  Service: Gastroenterology;;   Dennison Mascot  01/20/2022   Procedure: Dennison Mascot;  Surgeon: Iva Boop, MD;  Location: [facility];  Service: Gastroenterology;;    Family History  Problem Relation Age of Onset   Cancer Mother    Pancreatic cancer Mother    Bronchitis Father    Colon polyps Neg Hx    Esophageal cancer Neg Hx    Stomach cancer Neg Hx    Rectal cancer Neg Hx    Colon cancer Neg Hx    Breast cancer Neg Hx     Social History   Tobacco Use   Smoking status: Every Day    Current packs/day: 0.25    Types:  Cigarettes   Smokeless tobacco: Never   Tobacco comments:    5 per day  Vaping Use   Vaping status: Never Used  Substance Use Topics   Alcohol use: Yes    Alcohol/week: 7.0 standard drinks of alcohol    Types: 7 Cans of beer per week   Drug use: No    ROS Refer to HPI for ROS details.  Objective:   Vitals: BP (!) 155/69 (BP Location: Left Arm)   Pulse 66   Temp 98.3 F (36.8 C) (Oral)   Resp 18   SpO2 97%   Physical Exam Vitals and nursing note reviewed.  Constitutional:      General: She is not in acute distress.    Appearance: Normal appearance. She is well-developed. She is not ill-appearing or  toxic-appearing.  HENT:     Head: Normocephalic.  Cardiovascular:     Rate and Rhythm: Normal rate.  Pulmonary:     Effort: Pulmonary effort is normal. No respiratory distress.  Musculoskeletal:     Right knee: Bony tenderness present. No swelling, erythema or crepitus. Normal range of motion. Tenderness present over the medial joint line and MCL. Normal pulse.  Skin:    General: Skin is warm and dry.  Neurological:     General: No focal deficit present.     Mental Status: She is alert and oriented to person, place, and time.  Psychiatric:        Mood and Affect: Mood normal.     Procedures  No results found for this or any previous visit (from the past 24 hours).  Assessment and Plan :   PDMP not reviewed this encounter.   1. Acute pain of right knee (Primary) - DG Knee Complete 4 Views Right no acute fracture or dislocation, no significant degenerative changes. - diclofenac (VOLTAREN) 75 MG EC tablet; Take 1 tablet (75 mg total) by mouth 2 (two) times daily.  Dispense: 30 tablet; Refill: 0 - AMB referral to orthopedics for follow-up evaluation and ongoing management of right knee pain. -Continue to monitor symptoms for any change in severity if there is any escalation of current symptoms or development of new symptoms follow-up in ER for further evaluation and management.  Lucky Cowboy   [place], [place], [place] 09/11/23 [phone]

## 2023-09-11 NOTE — ED Triage Notes (Signed)
 Friday right knee started having pain and puffiness. Denies any injury  Taking Tylenol

## 2023-09-11 NOTE — Discharge Instructions (Addendum)
 1. Acute pain of right knee (Primary) - DG Knee Complete 4 Views Right no acute fracture or dislocation, no significant degenerative changes. - diclofenac (VOLTAREN) 75 MG EC tablet; Take 1 tablet (75 mg total) by mouth 2 (two) times daily.  Dispense: 30 tablet; Refill: 0 - AMB referral to orthopedics for follow-up evaluation and ongoing management of right knee pain.

## 2023-09-14 ENCOUNTER — Encounter: Payer: Self-pay | Admitting: Family Medicine

## 2023-09-14 ENCOUNTER — Other Ambulatory Visit: Payer: Self-pay

## 2023-09-14 ENCOUNTER — Ambulatory Visit: Admitting: Family Medicine

## 2023-09-14 VITALS — BP 144/62 | Ht 66.0 in | Wt 260.0 lb

## 2023-09-14 DIAGNOSIS — I10 Essential (primary) hypertension: Secondary | ICD-10-CM

## 2023-09-14 DIAGNOSIS — M23203 Derangement of unspecified medial meniscus due to old tear or injury, right knee: Secondary | ICD-10-CM

## 2023-09-14 DIAGNOSIS — M1711 Unilateral primary osteoarthritis, right knee: Secondary | ICD-10-CM

## 2023-09-14 DIAGNOSIS — M25561 Pain in right knee: Secondary | ICD-10-CM

## 2023-09-14 MED ORDER — METHYLPREDNISOLONE ACETATE 40 MG/ML IJ SUSP
40.0000 mg | Freq: Once | INTRAMUSCULAR | Status: AC
  Administered 2023-09-14: 40 mg via INTRA_ARTICULAR

## 2023-09-14 NOTE — Progress Notes (Signed)
 DATE OF VISIT: 09/14/2023        Allison Mcintyre DOB: [birth year] MRN: [id number]  CC:  Rt knee pain  History- Allison Mcintyre is a 63 y.o.  female for evaluation and treatment of RT knee pain Having pain for 2 months  No injury/trauma Increasing pain last week Went to [facility] 09/11/23 due to pain - review of note today during visit - xrays completed as noted below - referred to Ortho - given Rx Voltaren 75mg  PO bid prn  Today she reports ongoing pain in the medial knee Worse with twisting activities Voltaren p.o. 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 to pain   Past Medical History Past Medical History:  Diagnosis Date   Anemia    Bronchitis    Headache    Hemorrhoids    Hyperlipidemia    Hypertension    Menorrhagia     Past Surgical History Past Surgical History:  Procedure Laterality Date   ABDOMINAL HYSTERECTOMY  2003   CESAREAN SECTION     CHOLECYSTECTOMY N/A 01/19/2022   Procedure: LAPAROSCOPIC CHOLECYSTECTOMY WITH INTRAOPERATIVE CHOLANGIOGRAM;  Surgeon: Fritzi Mandes, MD;  Location: [facility];  Service: General;  Laterality: N/A;   COLONOSCOPY     ERCP N/A 01/20/2022   Procedure: ENDOSCOPIC RETROGRADE CHOLANGIOPANCREATOGRAPHY (ERCP);  Surgeon: Iva Boop, MD;  Location: [facility];  Service: Gastroenterology;  Laterality: N/A;   POLYPECTOMY     REMOVAL OF STONES  01/20/2022   Procedure: REMOVAL OF STONES;  Surgeon: Iva Boop, MD;  Location: [facility];  Service: Gastroenterology;;   Dennison Mascot  01/20/2022   Procedure: Dennison Mascot;  Surgeon: Iva Boop, MD;  Location: [facility];  Service: Gastroenterology;;    Medications Current Outpatient Medications  Medication Sig Dispense Refill   albuterol (VENTOLIN HFA) 108 (90 Base)  MCG/ACT inhaler Inhale 2 puffs into the lungs every 6 (six) hours as needed for wheezing or shortness of breath. 8 g 0   diclofenac (VOLTAREN) 75 MG EC tablet Take 1 tablet (75 mg total) by mouth 2 (two) times daily. 30 tablet 0   docusate sodium (COLACE) 100 MG capsule Take 1 capsule (100 mg total) by mouth 2 (two) times daily. 10 capsule 0   fluticasone (FLONASE) 50 MCG/ACT nasal spray Place 2 sprays into both nostrils daily. 16 g 0   losartan-hydrochlorothiazide (HYZAAR) 50-12.5 MG tablet TAKE 1 TABLET BY MOUTH DAILY. 90 tablet 3   pravastatin (PRAVACHOL) 40 MG tablet TAKE 1 TABLET (40 MG TOTAL) BY MOUTH DAILY. 90 tablet 3   promethazine-dextromethorphan (PROMETHAZINE-DM) 6.25-15 MG/5ML syrup Take 5 mLs by mouth 3 (three) times daily as needed for cough. 240 mL 0   Tiotropium Bromide Monohydrate (SPIRIVA RESPIMAT) 2.5 MCG/ACT AERS Inhale 2 puffs into the lungs daily. 4 g 1   verapamil (CALAN-SR) 120 MG CR tablet TAKE 1 TABLET (120 MG TOTAL) BY MOUTH DAILY. 90 tablet 3   No current facility-administered medications for this visit.    Allergies has no known allergies.  Family History - reviewed per EMR and intake form  Social History   reports current alcohol use of about 7.0 standard drinks of alcohol per week.  reports that she has been smoking cigarettes. She has never used smokeless tobacco.  reports no history of drug use. OCCUPATION: Housekeeper   EXAM: Vitals: BP (!) 149/68   Ht 5\' 6"  (1.676 m)   Wt 260 lb (117.9 kg)   BMI 41.97 kg/m  General: AOx3, NAD, pleasant SKIN: no rashes or lesions, skin clean, dry, intact MSK: Knee: Right knee with trace effusion, no increased redness or warmth.  Full range of motion with pain at terminal flexion.  Tender to palpation along the medial joint line, no tenderness over the lateral joint line.  Minimal tenderness over the Pes anserine bursa.  Positive McMurray with medial knee pain.  Negative Lachman, negative varus and valgus stress  test. Left knee with full range of motion without pain, weakness, instability Walking with an antalgic gait Neurovascularly intact distally  IMAGING: Limited MSK ultrasound right knee Date: 09/14/2023 Indication: Right knee pain Findings: -No significant effusion in the suprapatellar pouch.  Normal-appearing undersurface tendon -Normal-appearing patellar tendon in short and long axis views -Medial meniscus extruded from the medial joint line.  Does appear to have hypoechoic change within the substance of the meniscus consistent with a tear. -Lateral meniscus with hypoechoic change with suspected small tear.  Meniscus is not extruded  Impression: -Medial meniscus tear which is likely degenerative in nature -Suspected lateral meniscus tear Images and interpretation completed by Darene Lamer, DO   RT KNEE XR 09/11/23 @ [facility] personally reviewed and interpreted by me today showing: -  enthosophyte along proximal patella - mild medial joint space narrowing - no other acute bony abnormalities  Assessment & Plan Degenerative tear of medial meniscus of right knee Acute right medial knee pain for the last 2 months with MSK ultrasound showing likely degenerative medial meniscus tear, as well as expected lateral meniscus tear  Plan: -Urgent care notes reviewed as noted above in the HPI -Right knee x-ray reviewed as noted above showed mild degenerative changes -Ultrasound findings reviewed.  Diagnosis and treatment discussed.  Patient would be candidate for cortisone injection, she would like to proceed with this today.  Please see procedure note below -She would benefit from a knee sleeve.  Info for body helix knee sleeve given today -Given a home exercise program -Since she is experiencing fogginess with Voltaren p.o. I recommend she discontinue this medication.  I recommend that she use Voltaren gel every 6 hours as needed -She can apply heat or ice as needed -Given her a note to excuse her from  work tomorrow, can return to work on Thursday as scheduled -Follow-up 6 weeks for reevaluation, sooner as needed.  If no improvement could consider viscosupplementation, formal physical therapy, or advanced imaging  PROCEDURE:  Risks & benefits of RT knee cortisone injection reviewed. Consent obtained. Time-out completed. Patient prepped and draped in the normal fashion. Area cleansed with alcohol. Ethyl chloride spray used to anesthetize the skin. Solution of 4 mL 1% lidocaine with 1 mL methylprednisolone (Depo-medrol) 40mg /mL injected into the RT knee using a 22-gauge 2-inch needle via the anterior medial approach. Patient tolerated procedure well without any complications. Area covered with adhesive bandage.  Post-procedure care reviewed, all questions answered.  Primary osteoarthritis of right knee Acute right medial knee pain x 2 months with MSK ultrasound showing medial meniscus tear and x-ray showing mild osteoarthritis  Plan: -Diagnosis and treatment discussed -She would benefit from a knee sleeve.  Info for body helix knee sleeve given today -Given a home exercise program -Since she is experiencing fogginess with Voltaren p.o., recommend that she use Voltaren gel every 6 hours as  needed -She can apply heat or ice as needed -Cortisone injection completed in the right knee as noted above -Given her a note to excuse her from work tomorrow, can return to work on Thursday as scheduled -Follow-up 6 weeks for reevaluation, sooner as needed.  If no improvement could consider viscosupplementation, formal physical therapy, or advanced imaging Primary hypertension Elevated blood pressure today, likely secondary to pain  Plan: -Should continue her blood pressure medications as prescribed by PCP -Follow-up with PCP as scheduled  Patient expressed understanding & agreement with above.  Encounter Diagnoses  Name Primary?   Degenerative tear of medial meniscus of right knee Yes   Primary  osteoarthritis of right knee     Orders Placed This Encounter  Procedures   [place] LIMITED JOINT SPACE STRUCTURES LOW RIGHT    Orders Placed This Encounter  Procedures   [place] LIMITED JOINT SPACE STRUCTURES LOW RIGHT

## 2023-09-14 NOTE — Assessment & Plan Note (Signed)
 Elevated blood pressure today, likely secondary to pain  Plan: -Should continue her blood pressure medications as prescribed by PCP -Follow-up with PCP as scheduled

## 2023-09-14 NOTE — Patient Instructions (Signed)

## 2023-09-15 NOTE — Progress Notes (Signed)
 Internal Medicine Clinic Attending  Case discussed with the resident at the time of the visit.  We reviewed the resident's history and exam and pertinent patient test results.  I agree with the assessment, diagnosis, and plan of care documented in the resident's note.

## 2023-09-20 ENCOUNTER — Other Ambulatory Visit: Payer: Self-pay | Admitting: Student

## 2023-09-20 NOTE — Telephone Encounter (Signed)
 Medication sent to pharmacy

## 2023-10-01 ENCOUNTER — Ambulatory Visit (HOSPITAL_COMMUNITY)
Admission: RE | Admit: 2023-10-01 | Discharge: 2023-10-01 | Disposition: A | Discharge status: Home / Self Care | Source: Ambulatory Visit | Attending: Physician Assistant | Admitting: Physician Assistant

## 2023-10-01 ENCOUNTER — Encounter (HOSPITAL_COMMUNITY): Payer: Self-pay

## 2023-10-01 VITALS — BP 122/74 | HR 83 | Temp 98.7°F | Resp 18

## 2023-10-01 DIAGNOSIS — G44209 Tension-type headache, unspecified, not intractable: Secondary | ICD-10-CM | POA: Diagnosis not present

## 2023-10-01 DIAGNOSIS — J302 Other seasonal allergic rhinitis: Secondary | ICD-10-CM | POA: Diagnosis not present

## 2023-10-01 MED ORDER — BACLOFEN 10 MG PO TABS: 10.0000 mg | ORAL_TABLET | Freq: Every evening | ORAL | 0 refills | Status: AC | PRN

## 2023-10-01 MED ORDER — CETIRIZINE HCL 10 MG PO TABS: 10.0000 mg | ORAL_TABLET | Freq: Every day | ORAL | 0 refills | Status: DC

## 2023-10-01 NOTE — ED Triage Notes (Signed)
 Patient needing a note for work.
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
## 2023-10-01 NOTE — Discharge Instructions (Addendum)
 I believe that your headaches are tension headaches but could also be related to sinus headaches due to seasonal allergies.  Start baclofen  at night to help with the headaches.  This will make you sleepy so do not drive drink alcohol taking it.  I would like you to start cetirizine  to help with your congestion in case of sinus headache/allergy are also causing your headaches.  You can use over-the-counter medication including Tylenol  ibuprofen .  Make sure you rest and drink plenty fluid.  Take your blood pressure medication regularly.  If anything worsens and you have the worst headache of your life, weakness, vision change, nausea, vomiting, fever, neck pain you need to be seen immediately.

## 2023-10-05 ENCOUNTER — Other Ambulatory Visit: Payer: Self-pay

## 2023-10-07 NOTE — Telephone Encounter (Signed)
 Please address.

## 2023-10-11 MED ORDER — VERAPAMIL HCL ER 120 MG PO TBCR: 120.0000 mg | EXTENDED_RELEASE_TABLET | Freq: Every day | ORAL | 3 refills | Status: AC

## 2023-10-13 ENCOUNTER — Ambulatory Visit: Admitting: Internal Medicine

## 2023-10-13 ENCOUNTER — Ambulatory Visit: Payer: Self-pay

## 2023-10-13 VITALS — BP 129/64 | HR 66 | Temp 99.0°F | Ht 66.0 in | Wt 262.0 lb

## 2023-10-13 DIAGNOSIS — L237 Allergic contact dermatitis due to plants, except food: Secondary | ICD-10-CM

## 2023-10-13 MED ORDER — CLOBETASOL PROPIONATE 0.025 % EX CREA: 1.0000 | TOPICAL_CREAM | Freq: Two times a day (BID) | CUTANEOUS | 0 refills | Status: AC

## 2023-10-13 NOTE — Assessment & Plan Note (Signed)
 Patient explains that on Saturday she was moving limbs in her yard and not wearing gardening gloves, and on Sunday she woke up and noticed a rash on her R hand that was extremely itchy and had spread to her abdomen and L breast. She is R handed. She has applied antibacterial ointment and benadryl  cream to the areas and states the itch is getting better, but still persists. Assessment: I suspect allergic contact dermatitis from the limbs she was moving, and that when touching the skin of her abdomen and breast later the irritant came in contact with those areas as well causing itch. Plan:Will prescribe clobetasol cream 0.025% BID for 7 days for itch. Patient advised to contact the office if symptoms persist beyond 7 days.

## 2023-10-13 NOTE — Telephone Encounter (Signed)
 Pt has an appt today 4/30 with Dr Rozelle Corning.

## 2023-10-13 NOTE — Telephone Encounter (Signed)
 Chief Complaint: rash Symptoms: rash and blister Frequency: x 3 days Pertinent Negatives: Patient denies fever Disposition: [] ED /[] Urgent Care (no appt availability in office) / [x] Appointment(In office/virtual)/ []  [facility]/ [] Home Care/ [] Refused Recommended Disposition /[] [facility]/ []  Follow-up with PCP Additional Notes: pt states that Saturday she in her yard working and noticed rash on abd on Sunday and then spread to breast. States she is using cream on it and it is getting better.  States still itching. States she also has a blister on her hand.   Copied from CRM [id number]. Topic: Clinical - Red Word Triage >> Oct 13, 2023  8:05 AM Danelle Dunning F wrote: [facility] that prompted transfer to Nurse Triage:  Itching; Rash over her hands, breast, and body; visible blisters Reason for Disposition  SEVERE itching (i.e., interferes with sleep, normal activities or school)  Answer Assessment - Initial Assessment Questions 1. APPEARANCE of RASH: "Describe the rash." (e.g., spots, blisters, raised areas, skin peeling, scaly)     Red rash 2. SIZE: "How big are the spots?" (e.g., tip of pen, eraser, coin; inches, centimeters)     pencil 3. LOCATION: "Where is the rash located?"     abd and right breast 4. COLOR: "What color is the rash?" (Note: It is difficult to assess rash color in people with darker-colored skin. When this situation occurs, simply ask the caller to describe what they see.)     red 5. ONSET: "When did the rash begin?"     sunday 6. FEVER: "Do you have a fever?" If Yes, ask: "What is your temperature, how was it measured, and when did it start?"     no 7. ITCHING: "Does the rash itch?" If Yes, ask: "How bad is the itch?" (Scale 1-10; or mild, moderate, severe)     Mild-mod 8. CAUSE: "What do you think is causing the rash?"     In yard 9. MEDICINE FACTORS: "Have you started any new medicines within the last 2 weeks?" (e.g., antibiotics)      no 10.  OTHER SYMPTOMS: "Do you have any other symptoms?" (e.g., dizziness, headache, sore throat, joint pain)       Back pain  Protocols used: Rash or Redness - [facility]

## 2023-10-13 NOTE — Progress Notes (Signed)
   CC: itchy rash  HPI:  [name] is a 63 y.o. female with past medical history as detailed below who presents with itchy rash. Please see problem based charting for detailed assessment and plan.  Past Medical History:  Diagnosis Date   Anemia    Bronchitis    Headache    Hemorrhoids    Hyperlipidemia    Hypertension    Menorrhagia    Review of Systems:  Negative unless otherwise stated.  Physical Exam:  Vitals:   10/13/23 1532  BP: 129/64  Pulse: 66  Temp: 99 F (37.2 C)  TempSrc: Oral  SpO2: 99%  Weight: 262 lb (118.8 kg)  Height: 5\' 6"  (1.676 m)   Physical Exam Constitutional:      General: She is not in acute distress.    Appearance: She is not ill-appearing or toxic-appearing.  Skin:    Findings: Lesion and rash present. Rash is vesicular.          Comments: Rash including single vesicle over dorsal aspect of R hand. Eryhtema over medial left breast with superficial skin break from scratching. No other rash appreciated.  Neurological:     General: No focal deficit present.     Mental Status: She is oriented to person, place, and time.  Psychiatric:        Mood and Affect: Mood normal.        Behavior: Behavior normal.    Assessment & Plan:   See Encounters Tab for problem based charting.  Allergic contact dermatitis due to plant Patient explains that on Saturday she was moving limbs in her yard and not wearing gardening gloves, and on Sunday she woke up and noticed a rash on her R hand that was extremely itchy and had spread to her abdomen and L breast. She is R handed. She has applied antibacterial ointment and benadryl  cream to the areas and states the itch is getting better, but still persists. Assessment: I suspect allergic contact dermatitis from the limbs she was moving, and that when touching the skin of her abdomen and breast later the irritant came in contact with those areas as well causing itch. Plan:Will prescribe clobetasol cream  0.025% BID for 7 days for itch. Patient advised to contact the office if symptoms persist beyond 7 days.  Patient discussed with Dr. Adriane Albe

## 2023-10-13 NOTE — Patient Instructions (Signed)
 I have prescribed a cream for you to apply over your hand twice daily for one week. Please return to the clinic if your symptoms are not better after one week.  My best, Dr. Rozelle Corning

## 2023-10-16 NOTE — Progress Notes (Signed)
 Internal Medicine Clinic Attending  Case discussed with the resident at the time of the visit.  We reviewed the resident's history and exam and pertinent patient test results.  I agree with the assessment, diagnosis, and plan of care documented in the resident's note.

## 2023-10-18 ENCOUNTER — Ambulatory Visit (HOSPITAL_COMMUNITY)
Admission: RE | Admit: 2023-10-18 | Discharge: 2023-10-18 | Disposition: A | Discharge status: Home / Self Care | Source: Ambulatory Visit

## 2023-10-18 ENCOUNTER — Other Ambulatory Visit: Payer: Self-pay

## 2023-10-18 ENCOUNTER — Encounter (HOSPITAL_COMMUNITY): Payer: Self-pay

## 2023-10-18 ENCOUNTER — Ambulatory Visit (INDEPENDENT_AMBULATORY_CARE_PROVIDER_SITE_OTHER)

## 2023-10-18 VITALS — BP 144/81 | HR 63 | Temp 99.1°F | Resp 20

## 2023-10-18 DIAGNOSIS — J4521 Mild intermittent asthma with (acute) exacerbation: Secondary | ICD-10-CM

## 2023-10-18 DIAGNOSIS — R051 Acute cough: Secondary | ICD-10-CM

## 2023-10-18 NOTE — Discharge Instructions (Addendum)
  1. Mild intermittent asthma with acute exacerbation (Primary) - ED EKG shows normal sinus rhythm with a ventricular rate of 73 bpm, no STEMI, normal EKG. - DG Chest 2 View x-ray performed in UC shows no acute cardiopulmonary processes, no sign of consolidation or pneumonia. - Recommend reducing cigarette smoking is much as possible to reduce risk for worsening exacerbation of asthma. - Use albuterol  inhaler as directed for wheezing and difficulty breathing. - If symptoms become worse or you develop wheezing that will not improve with albuterol  follow-up in ER for further evaluation and management

## 2023-10-18 NOTE — ED Notes (Signed)
Reviewed work note with patient

## 2023-10-18 NOTE — ED Triage Notes (Signed)
 Patient complains of chest pain for 2 days, "but has eased off".  Complains of lower back pain.  Patient reports cough and runny nose  Patient offers minimal details when nurse asks questions.  Patient is eating chips during the intake process.

## 2023-10-18 NOTE — ED Provider Notes (Signed)
 [facility]  Note:  This document was prepared using Dragon voice recognition software and may include unintentional dictation errors.  MRN: [id number] DOB: 08-07-1960  Subjective:   Allison Mcintyre is a 63 y.o. female presenting for persistent dry cough, wheezing, chest congestion x 2 days.  Patient also having lower back pain, runny nose and nasal congestion.  Patient states that she has been smoking cigarettes more than usual.  Denies any fever, body aches, fatigue, shortness of breath, severe chest pain.  Patient reports right now all of her symptoms have subsided but she would like her lungs evaluated because she has a history of asthma and has been smoking more than normal due to increased stress with her living situation.  No current facility-administered medications for this encounter.  Current Outpatient Medications:    albuterol  (VENTOLIN  HFA) 108 (90 Base) MCG/ACT inhaler, Inhale 2 puffs into the lungs every 6 (six) hours as needed for wheezing or shortness of breath., Disp: 8 g, Rfl: 0   baclofen  (LIORESAL ) 10 MG tablet, Take 1 tablet (10 mg total) by mouth at bedtime as needed for muscle spasms., Disp: 5 each, Rfl: 0   Clobetasol  Propionate 0.025 % CREA, Apply 1 Application topically 2 (two) times daily for 7 days., Disp: 100 g, Rfl: 0   docusate sodium  (COLACE) 100 MG capsule, Take 1 capsule (100 mg total) by mouth 2 (two) times daily., Disp: 10 capsule, Rfl: 0   fluticasone  (FLONASE ) 50 MCG/ACT nasal spray, Place 2 sprays into both nostrils daily., Disp: 16 g, Rfl: 0   losartan -hydrochlorothiazide  (HYZAAR) 50-12.5 MG tablet, TAKE 1 TABLET BY MOUTH DAILY., Disp: 90 tablet, Rfl: 3   pravastatin  (PRAVACHOL ) 40 MG tablet, TAKE 1 TABLET (40 MG TOTAL) BY MOUTH DAILY., Disp: 90 tablet, Rfl: 3   SPIRIVA  RESPIMAT 2.5 MCG/ACT AERS, INHALE 2 PUFFS INTO THE LUNGS DAILY., Disp: 4 g, Rfl: 1   verapamil  (CALAN -SR) 120 MG CR tablet, Take 1 tablet (120 mg total) by mouth daily.,  Disp: 90 tablet, Rfl: 3   No Known Allergies  Past Medical History:  Diagnosis Date   Anemia    Bronchitis    Headache    Hemorrhoids    Hyperlipidemia    Hypertension    Menorrhagia      Past Surgical History:  Procedure Laterality Date   ABDOMINAL HYSTERECTOMY  2003   CESAREAN SECTION     CHOLECYSTECTOMY N/A 01/19/2022   Procedure: LAPAROSCOPIC CHOLECYSTECTOMY WITH INTRAOPERATIVE CHOLANGIOGRAM;  Surgeon: Lujean Sake, MD;  Location: [facility];  Service: General;  Laterality: N/A;   COLONOSCOPY     ERCP N/A 01/20/2022   Procedure: ENDOSCOPIC RETROGRADE CHOLANGIOPANCREATOGRAPHY (ERCP);  Surgeon: Kenney Peacemaker, MD;  Location: [facility];  Service: Gastroenterology;  Laterality: N/A;   POLYPECTOMY     REMOVAL OF STONES  01/20/2022   Procedure: REMOVAL OF STONES;  Surgeon: Kenney Peacemaker, MD;  Location: [facility];  Service: Gastroenterology;;   [address]  01/20/2022   Procedure: [address];  Surgeon: Kenney Peacemaker, MD;  Location: [facility];  Service: Gastroenterology;;    Family History  Problem Relation Age of Onset   Cancer Mother    Pancreatic cancer Mother    Bronchitis Father    Colon polyps Neg Hx    Esophageal cancer Neg Hx    Stomach cancer Neg Hx    Rectal cancer Neg Hx    Colon cancer Neg Hx    Breast cancer Neg Hx     Social History  Tobacco Use   Smoking status: Every Day    Current packs/day: 0.25    Types: Cigarettes   Smokeless tobacco: Never   Tobacco comments:    5 per day  Vaping Use   Vaping status: Never Used  Substance Use Topics   Alcohol use: Yes    Alcohol/week: 7.0 standard drinks of alcohol    Types: 7 Cans of beer per week   Drug use: No    ROS Refer to HPI for ROS details.  Objective:   Vitals: BP (!) 144/81 (BP Location: Right Arm)   Pulse 63   Temp 99.1 F (37.3 C) (Oral)   Resp 20   SpO2 94%   Physical Exam Vitals and nursing note reviewed.  Constitutional:      General: She is not in acute  distress.    Appearance: Normal appearance. She is well-developed. She is not ill-appearing or toxic-appearing.  HENT:     Head: Normocephalic and atraumatic.     Nose: Congestion present. No rhinorrhea.     Mouth/Throat:     Mouth: Mucous membranes are moist.     Pharynx: Oropharynx is clear.  Cardiovascular:     Rate and Rhythm: Normal rate and regular rhythm.     Heart sounds: Normal heart sounds. No murmur heard. Pulmonary:     Effort: Pulmonary effort is normal. No respiratory distress.     Breath sounds: Normal breath sounds. No stridor. No wheezing, rhonchi or rales.  Chest:     Chest wall: No tenderness.  Skin:    General: Skin is warm and dry.  Neurological:     General: No focal deficit present.     Mental Status: She is alert and oriented to person, place, and time.  Psychiatric:        Mood and Affect: Mood normal.        Behavior: Behavior normal.     Procedures  No results found for this or any previous visit (from the past 24 hours).  DG Chest 2 View Result Date: 10/18/2023 CLINICAL DATA:  Cough EXAM: CHEST - 2 VIEW COMPARISON:  None available. FINDINGS: No focal airspace consolidation, pleural effusion, or pneumothorax. Focal scarring in the left mid lung. No cardiomegaly. No acute fracture or destructive lesion. Multilevel thoracic osteophytosis. IMPRESSION: No acute cardiopulmonary abnormality. Electronically Signed   By: Rance Burrows M.D.   On: 10/18/2023 16:20     Assessment and Plan :     Discharge Instructions       1. Mild intermittent asthma with acute exacerbation (Primary) - ED EKG shows normal sinus rhythm with a ventricular rate of 73 bpm, no STEMI, normal EKG. - DG Chest 2 View x-ray performed in UC shows no acute cardiopulmonary processes, no sign of consolidation or pneumonia. - Recommend reducing cigarette smoking is much as possible to reduce risk for worsening exacerbation of asthma. - Use albuterol  inhaler as directed for wheezing  and difficulty breathing. - If symptoms become worse or you develop wheezing that will not improve with albuterol  follow-up in ER for further evaluation and management      Lyanne Kates B Tanai Bouler   Bonney Berres B, NP 10/18/23 1624

## 2023-10-26 ENCOUNTER — Encounter: Payer: Self-pay | Admitting: Family Medicine

## 2023-10-26 ENCOUNTER — Ambulatory Visit (INDEPENDENT_AMBULATORY_CARE_PROVIDER_SITE_OTHER): Admitting: Family Medicine

## 2023-10-26 VITALS — BP 138/63 | Ht 66.0 in | Wt 257.0 lb

## 2023-10-26 DIAGNOSIS — M23203 Derangement of unspecified medial meniscus due to old tear or injury, right knee: Secondary | ICD-10-CM | POA: Diagnosis not present

## 2023-10-26 DIAGNOSIS — S39012A Strain of muscle, fascia and tendon of lower back, initial encounter: Secondary | ICD-10-CM

## 2023-10-26 DIAGNOSIS — M1711 Unilateral primary osteoarthritis, right knee: Secondary | ICD-10-CM

## 2023-10-26 NOTE — Progress Notes (Signed)
 DATE OF VISIT: 10/26/2023        Allison Mcintyre DOB: Jun 08, 1961 MRN: [id number]  CC: Follow-up right knee; to complain of low back pain  History of present Illness: Allison Mcintyre is a 63 y.o. female who presents for a follow-up visit   Last seen by me 09/14/2023 - Underwent cortisone injection at that time - After 2 weeks he was feeling much better - Notes no longer having pain in the right knee and  Now having low back pain Felt a pop while moving a dryer Needed to stop Took Flexeril  prn at bedtime - has been helpful - no other meds Pain stays in the low back No radiation to the legs No lower ext weakness No change in bowel or bladder Feeling 70% improved overall Has been out of work since Friday of last week due to back pain  To [place] on June 3   Medications:  Outpatient Encounter Medications as of 10/26/2023  Medication Sig   albuterol  (VENTOLIN  HFA) 108 (90 Base) MCG/ACT inhaler Inhale 2 puffs into the lungs every 6 (six) hours as needed for wheezing or shortness of breath.   baclofen  (LIORESAL ) 10 MG tablet Take 1 tablet (10 mg total) by mouth at bedtime as needed for muscle spasms.   docusate sodium  (COLACE) 100 MG capsule Take 1 capsule (100 mg total) by mouth 2 (two) times daily.   fluticasone  (FLONASE ) 50 MCG/ACT nasal spray Place 2 sprays into both nostrils daily.   losartan -hydrochlorothiazide  (HYZAAR) 50-12.5 MG tablet TAKE 1 TABLET BY MOUTH DAILY.   pravastatin  (PRAVACHOL ) 40 MG tablet TAKE 1 TABLET (40 MG TOTAL) BY MOUTH DAILY.   SPIRIVA  RESPIMAT 2.5 MCG/ACT AERS INHALE 2 PUFFS INTO THE LUNGS DAILY.   verapamil  (CALAN -SR) 120 MG CR tablet Take 1 tablet (120 mg total) by mouth daily.   [DISCONTINUED] budesonide -formoterol  (SYMBICORT ) 80-4.5 MCG/ACT inhaler Inhale 2 puffs into the lungs daily. (Patient taking differently: Inhale 2 puffs into the lungs daily as needed (for flares).)   [DISCONTINUED] diclofenac  (VOLTAREN ) 75 MG EC tablet Take 1 tablet  (75 mg total) by mouth 2 (two) times daily.   [DISCONTINUED] promethazine -dextromethorphan (PROMETHAZINE -DM) 6.25-15 MG/5ML syrup Take 5 mLs by mouth 3 (three) times daily as needed for cough.   [DISCONTINUED] Tiotropium Bromide  Monohydrate (SPIRIVA  RESPIMAT) 2.5 MCG/ACT AERS Inhale 2 puffs into the lungs daily.   [DISCONTINUED] verapamil  (CALAN -SR) 120 MG CR tablet TAKE 1 TABLET (120 MG TOTAL) BY MOUTH DAILY.   No facility-administered encounter medications on file as of 10/26/2023.    Allergies: has no known allergies.  Physical Examination: Vitals: BP 138/63   Ht 5\' 6"  (1.676 m)   Wt 257 lb (116.6 kg)   BMI 41.48 kg/m  GENERAL:  Allison Mcintyre is a 63 y.o. female appearing their stated age, alert and oriented x 3, in no apparent distress.  SKIN: no rashes or lesions, skin clean, dry, intact MSK: Knee: Knee without swelling or effusion.  Near full motion with 1+ patellofemoral crepitus.  Minimal medial joint line tenderness.  No lateral joint line tenderness.  Negative Murray.  No ligamentous laxity.  Left knee full range of motion without pain.  L-spine: No gross deformity.  No midline tenderness.  Positive bilateral paraspinal tenderness right greater than left.  Mild SI joint tenderness as well.  Decreased range of motion with pain.  Negative SLR bilaterally.  Lower extremity strength 5/5 bilaterally.  Normal gait. NEURO: sensation intact to light touch lower extremity bilaterally VASC: pulses  2+ and symmetric lower extremity bilaterally, no edema   Assessment & Plan Degenerative tear of medial meniscus of right knee Right knee pain with OA and degenerative meniscus tear greatly improved status post cortisone injection 09/14/2023  Plan: - Continue use knee sleeve as needed - Continue home exercise program as needed - Continue Voltaren  gel as needed - Continue activity as tolerated. - She can follow-up with orthopedist in [place] area since she is moving in about 2 to 3  weeks.  Could be candidate for viscosupplementation or formal physical therapy in the future.  She is encouraged to reach out with any questions or concerns prior to her move Primary osteoarthritis of right knee Right knee pain with OA and degenerative meniscus tear greatly improved status post cortisone injection 09/14/2023  Plan: - Continue use knee sleeve as needed - Continue home exercise program as needed - Continue Voltaren  gel as needed - Continue activity as tolerated. - She can follow-up with orthopedist in [place] area since she is moving in about 2 to 3 weeks.  Could be candidate for viscosupplementation or formal physical therapy in the future.  She is encouraged to reach out with any questions or concerns prior to her move Strain of lumbar region, initial encounter Acute lumbar strain after moving a dryer last week, now about 70% improved.  No red flags  Plan: - Continue Flexeril  as needed - Over-the-counter NSAIDs as needed - Heating pad as needed - Given work note to excuse her Friday, 10/21/2023 through Tuesday 10/29/23, returning to work Wednesday, 10/27/2023 without restrictions - Should follow-up in 1 to 2 weeks if no improvement.  She can follow-up with an orthopedist in [place] if symptoms still persist as well   Patient expressed understanding & agreement with above.  Encounter Diagnoses  Name Primary?   Degenerative tear of medial meniscus of right knee Yes   Primary osteoarthritis of right knee    Strain of lumbar region, initial encounter     No orders of the defined types were placed in this encounter.

## 2023-10-28 ENCOUNTER — Other Ambulatory Visit: Payer: Self-pay | Admitting: Student

## 2023-10-28 DIAGNOSIS — Z1231 Encounter for screening mammogram for malignant neoplasm of breast: Secondary | ICD-10-CM

## 2023-11-02 ENCOUNTER — Ambulatory Visit

## 2023-11-04 ENCOUNTER — Ambulatory Visit

## 2023-11-10 ENCOUNTER — Ambulatory Visit

## 2023-11-14 IMAGING — US US ABDOMEN LIMITED
1 series · 14 of 25 positions shown · non-contrast
Comparison: January 23, 2019

CLINICAL DATA: Abdomen pain

EXAM:
ULTRASOUND ABDOMEN LIMITED RIGHT UPPER QUADRANT

[Series 1: us abdomen limited ruq (liver/gb) · 46 acquisitions, 14 frames shown]
[im 1/46]
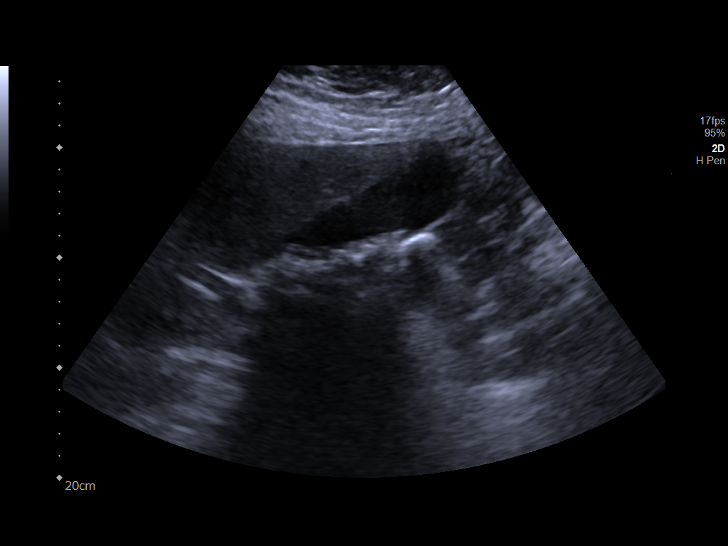
[im 4/46]
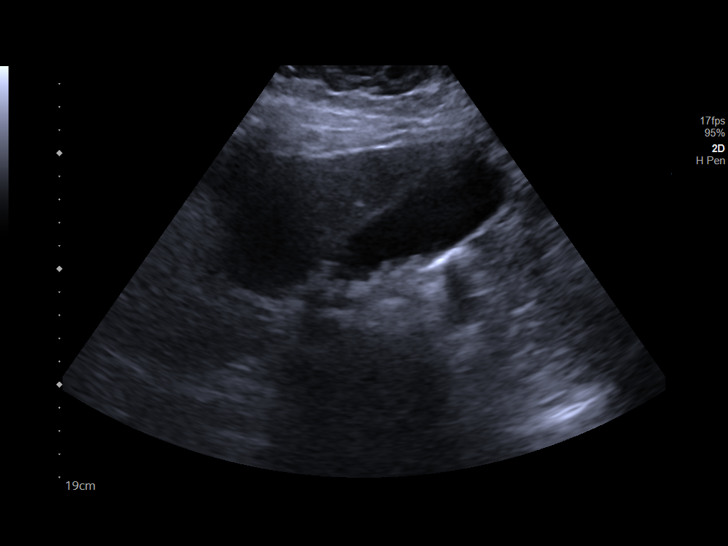
[im 8/46]
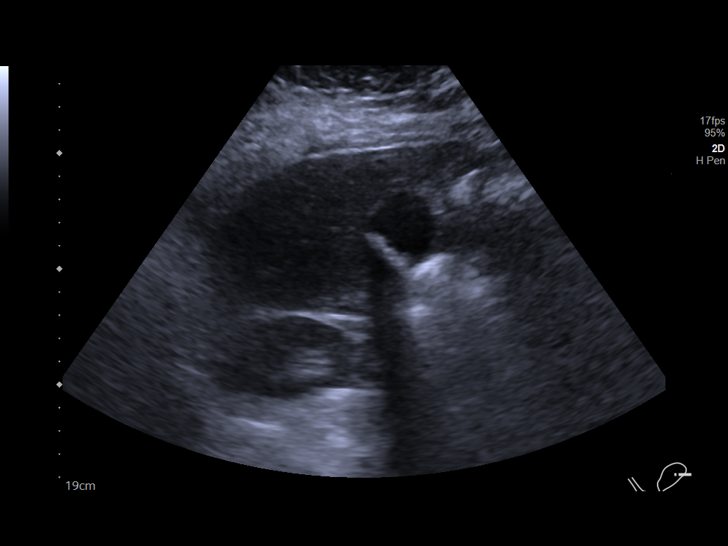
[im 12/46]
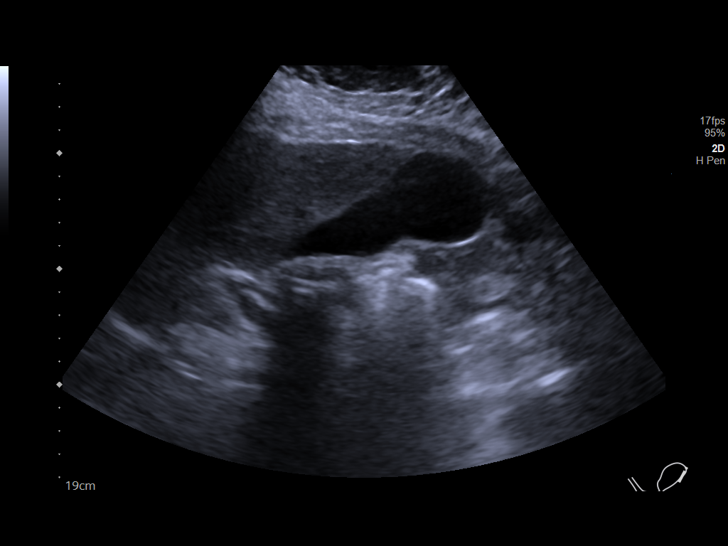
[im 16/46]
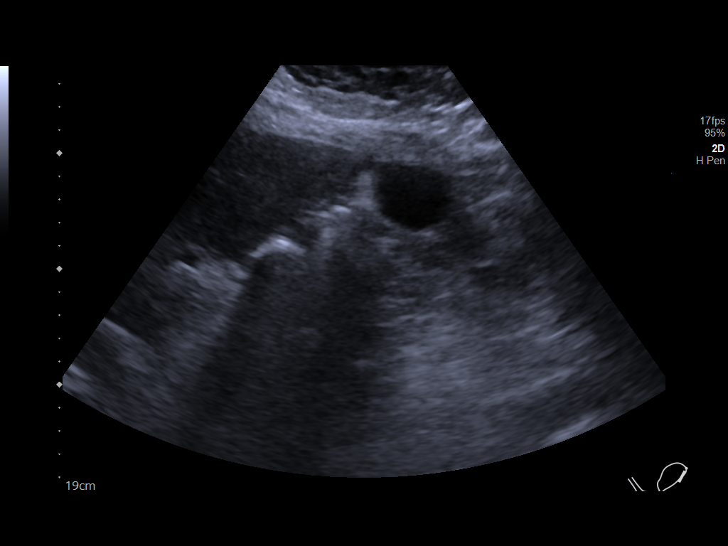
[im 17/46]
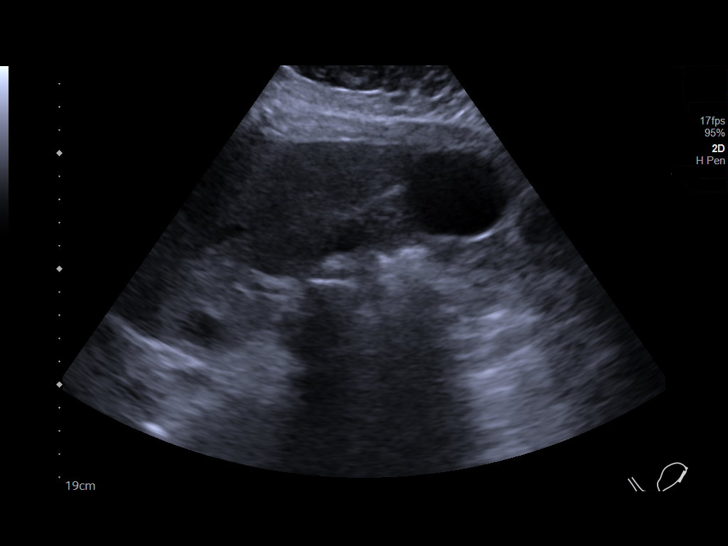
[im 21/46]
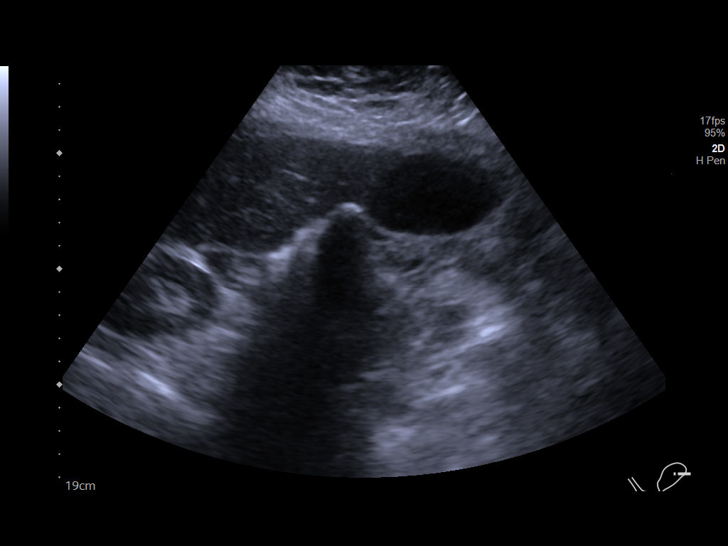
[im 25/46]
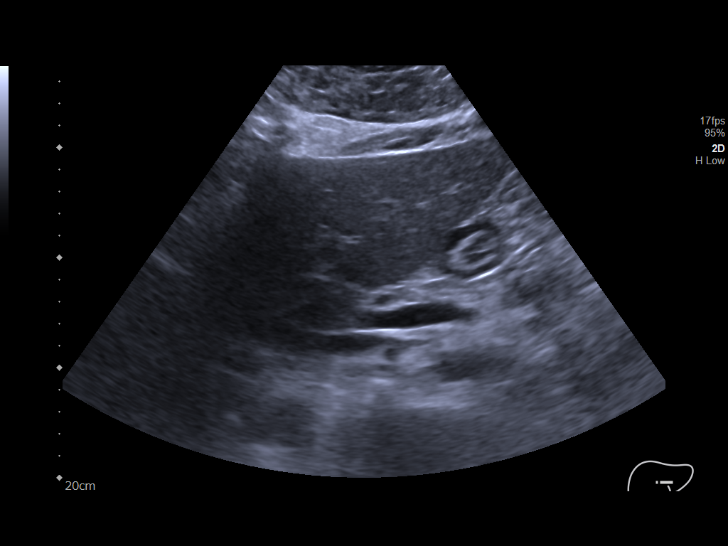
[im 29/46]
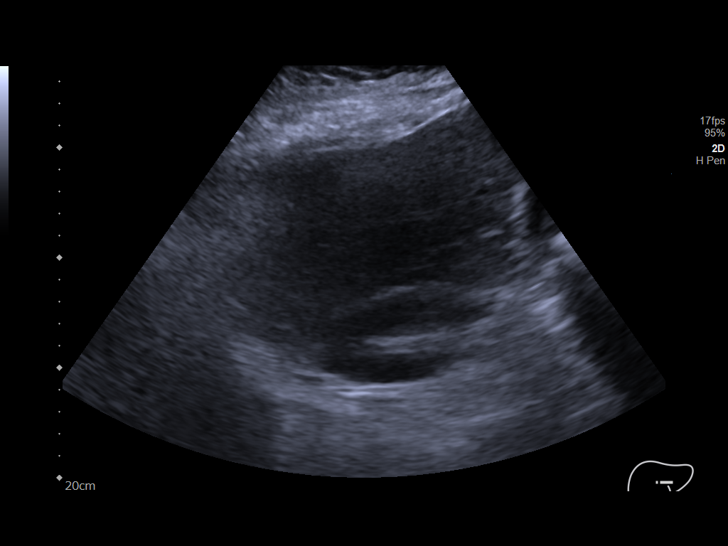
[im 31/46]
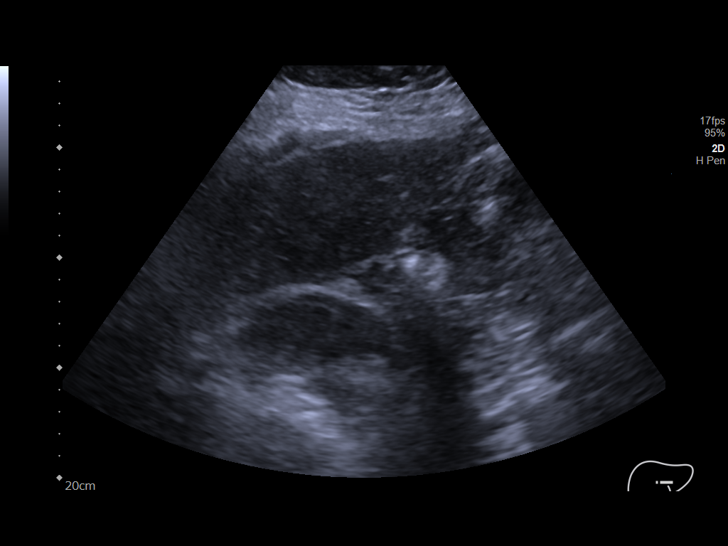
[im 34/46]
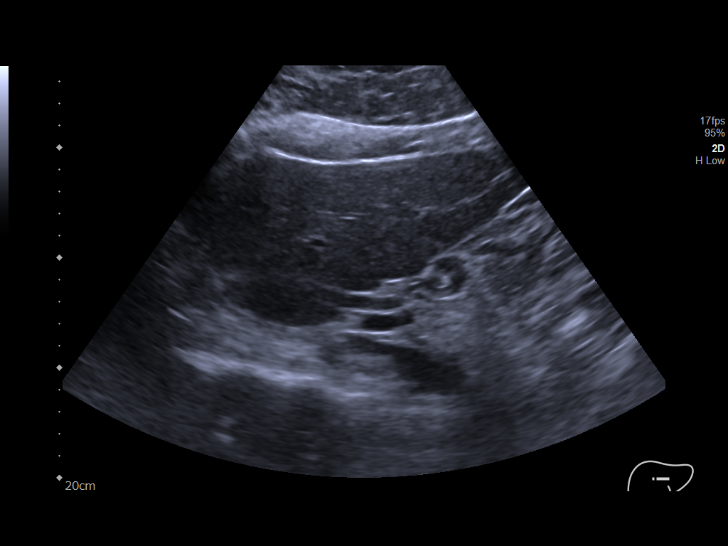
[im 38/46]
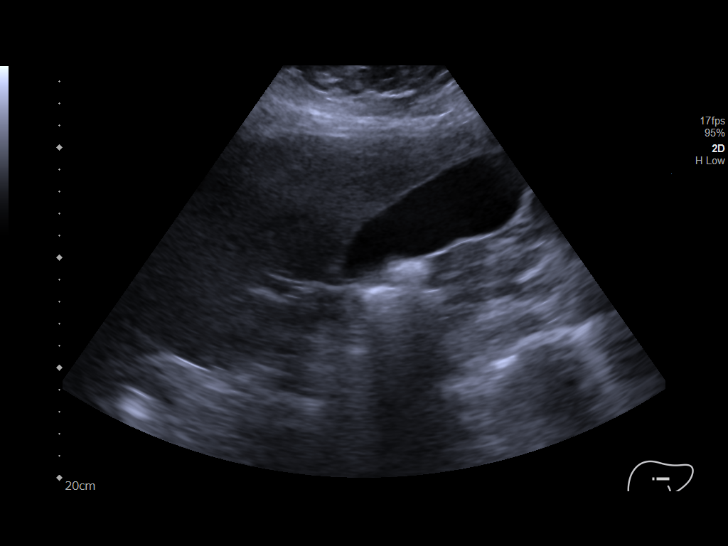
[im 42/46]
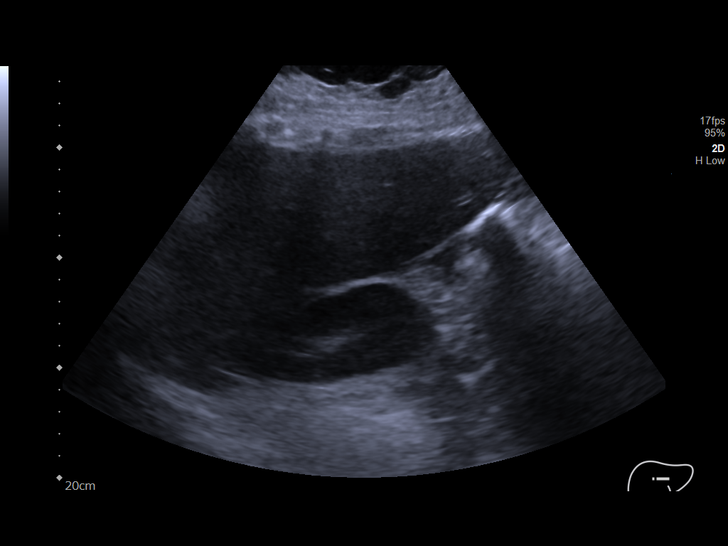
[im 46/46]
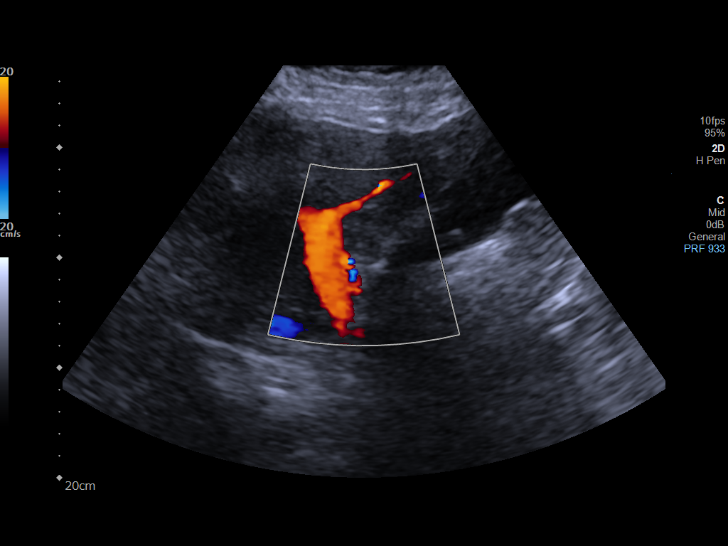

[14 of 25 positions shown; findings below may reference images not displayed]

FINDINGS: Gallbladder:

No wall thickening visualized. Sludge/gallstones are noted in the
gallbladder. No sonographic Murphy sign noted by sonographer.

Common bile duct:

Diameter: 4.3 mm

Liver:

No focal lesion identified. Diffuse increased echotexture of the
liver is noted. Portal vein is patent on color Doppler imaging with
normal direction of blood flow towards the liver.

Other: None.
IMPRESSION: 1. Cholelithiasis without sonographic evidence of acute
cholecystitis.
2. Fatty infiltration of liver.

## 2023-11-22 ENCOUNTER — Other Ambulatory Visit: Payer: Self-pay | Admitting: Student

## 2023-11-22 NOTE — Telephone Encounter (Signed)
 Copied from CRM [id number]. Topic: Clinical - Medication Refill >> Nov 22, 2023 12:18 PM Adrianna P wrote: Medication: promethazine -dextromethorphan (PROMETHAZINE -DM) 6.25-15 MG/5ML syrup  Has the patient contacted their pharmacy? No (Agent: If no, request that the patient contact the pharmacy for the refill. If patient does not wish to contact the pharmacy document the reason why and proceed with request.) (Agent: If yes, when and what did the pharmacy advise?)  This is the patient's preferred pharmacy:  [facility] - [place], [place] - [address] [place] [address] Phone: [phone] Fax: [phone]  Is this the correct pharmacy for this prescription? Yes If no, delete pharmacy and type the correct one.   Has the prescription been filled recently? No  Is the patient out of the medication? Yes  Has the patient been seen for an appointment in the last year OR does the patient have an upcoming appointment? Yes  Can we respond through MyChart? No  Agent: Please be advised that Rx refills may take up to 3 business days. We ask that you follow-up with your pharmacy.

## 2023-11-22 NOTE — Telephone Encounter (Signed)
 Patient Poss calling back to get medication sent to an alternate pharmacy  [phone] - [place], [place] - [address] AT [facility] [address] [place] [phone]: [phone] Fax: [phone]: Not open 24 hours   Please advise

## 2023-11-22 NOTE — Telephone Encounter (Signed)
Pt requesting medication be sent to alternative pharmacy.

## 2023-11-23 MED ORDER — PROMETHAZINE HCL 6.25 MG/5ML PO SOLN: 6.2500 mg | Freq: Four times a day (QID) | ORAL | 0 refills | Status: AC | PRN

## 2024-05-15 ENCOUNTER — Other Ambulatory Visit: Payer: Self-pay

## 2024-05-15 NOTE — Telephone Encounter (Signed)
 error
